# Patient Record
Sex: Female | Born: 1960 | Race: White | Hispanic: No | Smoking: Former smoker
Health system: Southern US, Community
[De-identification: ages and names within clinical notes are randomized; demographics above are authoritative.]

## PROBLEM LIST (undated history)

## (undated) DIAGNOSIS — S32040A Wedge compression fracture of fourth lumbar vertebra, initial encounter for closed fracture: Secondary | ICD-10-CM

## (undated) DIAGNOSIS — C801 Malignant (primary) neoplasm, unspecified: Secondary | ICD-10-CM

## (undated) DIAGNOSIS — Z460 Encounter for fitting and adjustment of spectacles and contact lenses: Secondary | ICD-10-CM

## (undated) DIAGNOSIS — Z972 Presence of dental prosthetic device (complete) (partial): Secondary | ICD-10-CM

## (undated) DIAGNOSIS — Z923 Personal history of irradiation: Secondary | ICD-10-CM

## (undated) DIAGNOSIS — M1990 Unspecified osteoarthritis, unspecified site: Secondary | ICD-10-CM

## (undated) DIAGNOSIS — M109 Gout, unspecified: Secondary | ICD-10-CM

## (undated) DIAGNOSIS — M349 Systemic sclerosis, unspecified: Secondary | ICD-10-CM

## (undated) DIAGNOSIS — C50919 Malignant neoplasm of unspecified site of unspecified female breast: Secondary | ICD-10-CM

## (undated) DIAGNOSIS — I7300 Raynaud's syndrome without gangrene: Secondary | ICD-10-CM

## (undated) DIAGNOSIS — M810 Age-related osteoporosis without current pathological fracture: Secondary | ICD-10-CM

## (undated) DIAGNOSIS — M11279 Other chondrocalcinosis, unspecified ankle and foot: Secondary | ICD-10-CM

## (undated) HISTORY — DX: Personal history of irradiation: Z92.3

## (undated) HISTORY — DX: Systemic sclerosis, unspecified: M34.9

## (undated) HISTORY — PX: TONSILLECTOMY: SUR1361

## (undated) HISTORY — PX: WRIST GANGLION EXCISION: SUR520

## (undated) HISTORY — PX: KNEE ARTHROPLASTY: SHX992

## (undated) HISTORY — PX: BREAST SURGERY: SHX581

## (undated) HISTORY — PX: ELBOW SURGERY: SHX618

---

## 2004-05-04 HISTORY — PX: KNEE ARTHROSCOPY: SUR90

## 2010-05-04 HISTORY — PX: CERVICAL CONIZATION W/BX: SHX1330

## 2012-03-01 ENCOUNTER — Other Ambulatory Visit: Payer: Self-pay | Admitting: Orthopedic Surgery

## 2012-03-04 ENCOUNTER — Encounter (HOSPITAL_BASED_OUTPATIENT_CLINIC_OR_DEPARTMENT_OTHER): Payer: Self-pay | Admitting: *Deleted

## 2012-03-04 NOTE — Progress Notes (Signed)
No cardiac or resp problems 

## 2012-03-07 NOTE — H&P (Addendum)
  Doris Bender is an 51 y.o. female.   Chief Complaint: c/o chronic pain right lateral elbow and right wrist dorsal ganglion cyst. HPI: She has a history of pain in the lateral aspect of her right elbow dating back more than 5 months. She can't lift an ice tea pitcher and can't do much lifting in a palm down position without severe pain. She has really not had any specific treatment for this other than resting her elbow. A second problem is a small but very painful sub-retinacular dorsal ganglion cyst of the right wrist.    Past Medical History  Diagnosis Date  . No pertinent past medical history   . Raynaud's disease   . Arthritis   . Contact lens/glasses fitting     wears contacts or glasses  . Wears partial dentures     partial lower    Past Surgical History  Procedure Date  . Wrist ganglion excision     left x2  . Tonsillectomy   . Breast surgery     rt br bx  . Knee arthroscopy 2006    right  . Cervical conization w/bx     No family history on file. Social History:  reports that she has been smoking.  She does not have any smokeless tobacco history on file. She reports that she drinks alcohol. Her drug history not on file.  Allergies:  Allergies  Allergen Reactions  . Ibuprofen Hives  . Keflex (Cephalexin) Hives  . Ketorolac Hives  . Lodine (Etodolac) Hives    No prescriptions prior to admission    No results found for this or any previous visit (from the past 48 hour(s)).  No results found.   Pertinent items are noted in HPI.  Height 5' 5.5" (1.664 m), weight 58.968 kg (130 lb).  General appearance: alert Head: Normocephalic, without obvious abnormality Neck: supple, symmetrical, trachea midline Resp: clear to auscultation bilaterally Cardio: regular rate and rhythm GI: normal findings: bowel sounds normal Extremities:Physical exam reveals a thin well appearing 51 year old woman. Inspection of her arm reveals no visible deformity. In wrist  palmar flexion she is limited at 45 degrees with pain. There is a 1 cm diameter sub-retinacular ganglion palpable directly over the scapholunate ligament. This causes pain with attempted dorsiflexion and load bearing and/or palmar flexion. She has full ROM of her fingers in flexion/extension. Her pulses and capillary refill are intact.  Her elbow exam reveals extension to neutral, further flexion to 140. She is very tender on palpation over her extensor carpi radialis brevis origin and has a positive middle finger extension test. She cannot tolerate any resisted wrist extension.   Plain films of her wrist demonstrate normal bony anatomy.   X-rays of her elbow demonstrate normal anatomy with small amount of new bone formation at the lateral epicondyle evidencing chronic lateral epicondylitis.   Pulses: 2+ and symmetric Skin: normal Neurologic: Grossly normal    Assessment/Plan Impression:Right elbow chronic lateral epicondylitis and right wrist dorsal ganglion cyst  Plan:To the OR for reconstruction extensor origin and excision dorsal ganglion cyst.The procedure, risks,benefits and post-op course were discussed with the patient at length and they were in agreement with the plan.   DASNOIT,Loxley Cibrian J 03/07/2012, 9:28 PM     H&P documentation: 03/08/2012  -History and Physical Reviewed  -Patient has been re-examined  -No change in the plan of care  Wyn Forster, MD

## 2012-03-08 ENCOUNTER — Ambulatory Visit (HOSPITAL_BASED_OUTPATIENT_CLINIC_OR_DEPARTMENT_OTHER): Payer: BC Managed Care – PPO | Admitting: Anesthesiology

## 2012-03-08 ENCOUNTER — Ambulatory Visit (HOSPITAL_BASED_OUTPATIENT_CLINIC_OR_DEPARTMENT_OTHER)
Admission: RE | Admit: 2012-03-08 | Discharge: 2012-03-08 | Disposition: A | Discharge status: Home / Self Care | Payer: BC Managed Care – PPO | Source: Ambulatory Visit | Attending: Orthopedic Surgery | Admitting: Orthopedic Surgery

## 2012-03-08 ENCOUNTER — Encounter (HOSPITAL_BASED_OUTPATIENT_CLINIC_OR_DEPARTMENT_OTHER): Payer: Self-pay | Admitting: Anesthesiology

## 2012-03-08 ENCOUNTER — Encounter (HOSPITAL_BASED_OUTPATIENT_CLINIC_OR_DEPARTMENT_OTHER): Payer: Self-pay

## 2012-03-08 ENCOUNTER — Encounter (HOSPITAL_BASED_OUTPATIENT_CLINIC_OR_DEPARTMENT_OTHER)
Admission: RE | Disposition: A | Discharge status: Home / Self Care | Payer: Self-pay | Source: Ambulatory Visit | Attending: Orthopedic Surgery

## 2012-03-08 DIAGNOSIS — M6740 Ganglion, unspecified site: Secondary | ICD-10-CM | POA: Insufficient documentation

## 2012-03-08 DIAGNOSIS — M6580 Other synovitis and tenosynovitis, unspecified site: Secondary | ICD-10-CM | POA: Insufficient documentation

## 2012-03-08 DIAGNOSIS — M7710 Lateral epicondylitis, unspecified elbow: Secondary | ICD-10-CM | POA: Insufficient documentation

## 2012-03-08 HISTORY — DX: Encounter for fitting and adjustment of spectacles and contact lenses: Z46.0

## 2012-03-08 HISTORY — DX: Unspecified osteoarthritis, unspecified site: M19.90

## 2012-03-08 HISTORY — PX: LATERAL EPICONDYLE RELEASE: SHX1958

## 2012-03-08 HISTORY — DX: Raynaud's syndrome without gangrene: I73.00

## 2012-03-08 HISTORY — DX: Presence of dental prosthetic device (complete) (partial): Z97.2

## 2012-03-08 HISTORY — PX: MASS EXCISION: SHX2000

## 2012-03-08 LAB — POCT HEMOGLOBIN-HEMACUE: Hemoglobin: 15.2 g/dL — ABNORMAL HIGH (ref 12.0–15.0)

## 2012-03-08 SURGERY — TENNIS ELBOW RELEASE/NIRSCHEL PROCEDURE
Anesthesia: General | Site: Wrist | Laterality: Right | Wound class: Clean

## 2012-03-08 MED ORDER — LACTATED RINGERS IV SOLN
INTRAVENOUS | Status: DC
  Administered 2012-03-08: 12:00:00 via INTRAVENOUS
  Administered 2012-03-08: 10 mL/h via INTRAVENOUS
  Administered 2012-03-08: 10:00:00 via INTRAVENOUS

## 2012-03-08 MED ORDER — DOXYCYCLINE HYCLATE 100 MG PO TABS: 100.0000 mg | ORAL_TABLET | Freq: Two times a day (BID) | ORAL | Status: DC

## 2012-03-08 MED ORDER — FENTANYL CITRATE 0.05 MG/ML IJ SOLN
INTRAMUSCULAR | Status: DC | PRN
  Administered 2012-03-08: 100 ug via INTRAVENOUS
  Administered 2012-03-08: 50 ug via INTRAVENOUS

## 2012-03-08 MED ORDER — OXYCODONE HCL 5 MG PO TABS: 5.0000 mg | ORAL_TABLET | Freq: Once | ORAL | Status: DC | PRN

## 2012-03-08 MED ORDER — OXYCODONE HCL 5 MG/5ML PO SOLN: 5.0000 mg | Freq: Once | ORAL | Status: DC | PRN

## 2012-03-08 MED ORDER — HYDROMORPHONE HCL PF 1 MG/ML IJ SOLN
0.2500 mg | INTRAMUSCULAR | Status: DC | PRN
  Administered 2012-03-08 (×4): 0.5 mg via INTRAVENOUS

## 2012-03-08 MED ORDER — LIDOCAINE HCL (CARDIAC) 20 MG/ML IV SOLN
INTRAVENOUS | Status: DC | PRN
  Administered 2012-03-08: 50 mg via INTRAVENOUS

## 2012-03-08 MED ORDER — PROPOFOL 10 MG/ML IV BOLUS
INTRAVENOUS | Status: DC | PRN
Start: 2012-03-08 — End: 2012-03-08
  Administered 2012-03-08: 150 mg via INTRAVENOUS

## 2012-03-08 MED ORDER — PROMETHAZINE HCL 25 MG/ML IJ SOLN
6.2500 mg | Freq: Once | INTRAMUSCULAR | Status: AC | PRN
  Administered 2012-03-08: 6.25 mg via INTRAVENOUS

## 2012-03-08 MED ORDER — OXYCODONE-ACETAMINOPHEN 5-325 MG PO TABS: ORAL_TABLET | ORAL | Status: DC

## 2012-03-08 MED ORDER — VANCOMYCIN HCL IN DEXTROSE 1-5 GM/200ML-% IV SOLN
1000.0000 mg | INTRAVENOUS | Status: AC
  Administered 2012-03-08: 1000 mg via INTRAVENOUS

## 2012-03-08 MED ORDER — LIDOCAINE HCL 2 % IJ SOLN
INTRAMUSCULAR | Status: DC | PRN
  Administered 2012-03-08: 4 mL

## 2012-03-08 MED ORDER — DEXAMETHASONE SODIUM PHOSPHATE 4 MG/ML IJ SOLN
INTRAMUSCULAR | Status: DC | PRN
  Administered 2012-03-08: 5 mg via INTRAVENOUS

## 2012-03-08 MED ORDER — CHLORHEXIDINE GLUCONATE 4 % EX LIQD: 60.0000 mL | Freq: Once | CUTANEOUS | Status: DC

## 2012-03-08 MED ORDER — ONDANSETRON HCL 4 MG/2ML IJ SOLN
INTRAMUSCULAR | Status: DC | PRN
  Administered 2012-03-08: 4 mg via INTRAVENOUS

## 2012-03-08 SURGICAL SUPPLY — 87 items
BANDAGE ADHESIVE 1X3 (GAUZE/BANDAGES/DRESSINGS) IMPLANT
BANDAGE CONFORM 3  STR LF (GAUZE/BANDAGES/DRESSINGS) IMPLANT
BANDAGE ELASTIC 3 VELCRO ST LF (GAUZE/BANDAGES/DRESSINGS) IMPLANT
BANDAGE ELASTIC 4 VELCRO ST LF (GAUZE/BANDAGES/DRESSINGS) ×3 IMPLANT
BLADE MINI RND TIP GREEN BEAV (BLADE) ×3 IMPLANT
BLADE SURG 10 STRL SS (BLADE) IMPLANT
BLADE SURG 15 STRL LF DISP TIS (BLADE) ×2 IMPLANT
BLADE SURG 15 STRL SS (BLADE) ×1
BNDG COHESIVE 1X5 TAN STRL LF (GAUZE/BANDAGES/DRESSINGS) ×3 IMPLANT
BNDG ELASTIC 2 VLCR STRL LF (GAUZE/BANDAGES/DRESSINGS) IMPLANT
BNDG ESMARK 4X9 LF (GAUZE/BANDAGES/DRESSINGS) ×3 IMPLANT
BRUSH SCRUB EZ PLAIN DRY (MISCELLANEOUS) ×3 IMPLANT
CLOTH BEACON ORANGE TIMEOUT ST (SAFETY) ×3 IMPLANT
CORDS BIPOLAR (ELECTRODE) ×3 IMPLANT
COVER MAYO STAND STRL (DRAPES) ×3 IMPLANT
COVER TABLE BACK 60X90 (DRAPES) ×3 IMPLANT
CUFF TOURNIQUET SINGLE 18IN (TOURNIQUET CUFF) ×3 IMPLANT
DECANTER SPIKE VIAL GLASS SM (MISCELLANEOUS) IMPLANT
DRAIN PENROSE 1/2X12 LTX STRL (WOUND CARE) IMPLANT
DRAIN PENROSE 1/4X12 LTX STRL (WOUND CARE) IMPLANT
DRAPE EXTREMITY T 121X128X90 (DRAPE) ×3 IMPLANT
DRAPE SURG 17X23 STRL (DRAPES) ×3 IMPLANT
DRSG PAD ABDOMINAL 8X10 ST (GAUZE/BANDAGES/DRESSINGS) IMPLANT
DRSG TEGADERM 2-3/8X2-3/4 SM (GAUZE/BANDAGES/DRESSINGS) IMPLANT
DRSG TEGADERM 4X4.75 (GAUZE/BANDAGES/DRESSINGS) ×3 IMPLANT
GAUZE XEROFORM 1X8 LF (GAUZE/BANDAGES/DRESSINGS) IMPLANT
GLOVE BIO SURGEON STRL SZ 6.5 (GLOVE) ×3 IMPLANT
GLOVE BIOGEL M STRL SZ7.5 (GLOVE) ×3 IMPLANT
GLOVE BIOGEL PI IND STRL 8 (GLOVE) IMPLANT
GLOVE BIOGEL PI INDICATOR 8 (GLOVE)
GLOVE EXAM NITRILE EXT CUFF MD (GLOVE) ×3 IMPLANT
GLOVE ORTHO TXT STRL SZ7.5 (GLOVE) ×3 IMPLANT
GOWN PREVENTION PLUS XLARGE (GOWN DISPOSABLE) ×3 IMPLANT
GOWN STRL REIN XL XLG (GOWN DISPOSABLE) ×6 IMPLANT
KWIRE 4.0 X .035IN (WIRE) ×3 IMPLANT
KWIRE 4.0 X .045IN (WIRE) IMPLANT
LOOP VESSEL MAXI BLUE (MISCELLANEOUS) IMPLANT
NDL SAFETY ECLIPSE 18X1.5 (NEEDLE) ×2 IMPLANT
NDL SUT 6 .5 CRC .975X.05 MAYO (NEEDLE) IMPLANT
NEEDLE 27GAX1X1/2 (NEEDLE) ×3 IMPLANT
NEEDLE BLUNT 17GA (NEEDLE) ×3 IMPLANT
NEEDLE FISTULA 1/2 CIRCLE (NEEDLE) IMPLANT
NEEDLE HYPO 18GX1.5 SHARP (NEEDLE) ×1
NEEDLE KEITH (NEEDLE) IMPLANT
NEEDLE KEITH SZ10 STRAIGHT (NEEDLE) IMPLANT
NEEDLE MAYO TAPER (NEEDLE)
PACK BASIN DAY SURGERY FS (CUSTOM PROCEDURE TRAY) ×3 IMPLANT
PAD CAST 3X4 CTTN HI CHSV (CAST SUPPLIES) ×2 IMPLANT
PAD CAST 4YDX4 CTTN HI CHSV (CAST SUPPLIES) IMPLANT
PADDING CAST ABS 4INX4YD NS (CAST SUPPLIES)
PADDING CAST ABS COTTON 4X4 ST (CAST SUPPLIES) IMPLANT
PADDING CAST COTTON 3X4 STRL (CAST SUPPLIES) ×1
PADDING CAST COTTON 4X4 STRL (CAST SUPPLIES)
PADDING UNDERCAST 2  STERILE (CAST SUPPLIES) IMPLANT
SLEEVE SCD COMPRESS KNEE MED (MISCELLANEOUS) ×3 IMPLANT
SLING ARM FOAM STRAP LRG (SOFTGOODS) IMPLANT
SPLINT PLASTER CAST XFAST 3X15 (CAST SUPPLIES) ×10 IMPLANT
SPLINT PLASTER XTRA FASTSET 3X (CAST SUPPLIES) ×5
SPONGE GAUZE 4X4 12PLY (GAUZE/BANDAGES/DRESSINGS) ×3 IMPLANT
STOCKINETTE 4X48 STRL (DRAPES) ×3 IMPLANT
STRIP CLOSURE SKIN 1/2X4 (GAUZE/BANDAGES/DRESSINGS) ×3 IMPLANT
SUT ETHIBOND 2 OS 4 DA (SUTURE) IMPLANT
SUT ETHILON 5 0 P 3 18 (SUTURE) ×1
SUT FIBERWIRE 2-0 18 17.9 3/8 (SUTURE)
SUT FIBERWIRE 3-0 18 TAPR NDL (SUTURE) ×6
SUT MERSILENE 4 0 P 3 (SUTURE) IMPLANT
SUT NYLON ETHILON 5-0 P-3 1X18 (SUTURE) ×2 IMPLANT
SUT PROLENE 3 0 PS 2 (SUTURE) ×3 IMPLANT
SUT VIC AB 2-0 SH 27 (SUTURE)
SUT VIC AB 2-0 SH 27XBRD (SUTURE) IMPLANT
SUT VIC AB 3-0 SH 27 (SUTURE) ×1
SUT VIC AB 3-0 SH 27X BRD (SUTURE) ×2 IMPLANT
SUT VIC AB 3-0 X1 27 (SUTURE) IMPLANT
SUT VIC AB 4-0 P-3 18XBRD (SUTURE) IMPLANT
SUT VIC AB 4-0 P3 18 (SUTURE)
SUT VICRYL 4-0 PS2 18IN ABS (SUTURE) IMPLANT
SUTURE FIBERWR 2-0 18 17.9 3/8 (SUTURE) IMPLANT
SUTURE FIBERWR 3-0 18 TAPR NDL (SUTURE) ×4 IMPLANT
SYR 20CC LL (SYRINGE) IMPLANT
SYR 3ML 23GX1 SAFETY (SYRINGE) IMPLANT
SYR BULB 3OZ (MISCELLANEOUS) ×3 IMPLANT
SYR CONTROL 10ML LL (SYRINGE) ×3 IMPLANT
TOWEL OR 17X24 6PK STRL BLUE (TOWEL DISPOSABLE) ×6 IMPLANT
TRAY DSU PREP LF (CUSTOM PROCEDURE TRAY) ×3 IMPLANT
TUBE CONNECTING 20X1/4 (TUBING) IMPLANT
UNDERPAD 30X30 INCONTINENT (UNDERPADS AND DIAPERS) ×3 IMPLANT
WATER STERILE IRR 1000ML POUR (IV SOLUTION) IMPLANT

## 2012-03-08 NOTE — Brief Op Note (Signed)
03/08/2012  12:02 PM  PATIENT:  Doris Bender  51 y.o. female  PRE-OPERATIVE DIAGNOSIS:  LATERAL ELBOW PAIN, RIGHT WRIST CYST [phone]  POST-OPERATIVE DIAGNOSIS:  LATERAL ELBOW PAIN, RIGHT WRIST CYST [phone]  PROCEDURE:  Procedure(s) (LRB) with comments: TENNIS ELBOW RELEASE (Right) - RECONSTRUCTION OF RIGHT LATERAL ELBOW WITH TENDONS EXCISION MASS (Right) - EXCISION OF RIGHT WRIST DORSAL CYST  SURGEON:  Surgeon(s) and Role:    * Wyn Forster., MD - Primary  PHYSICIAN ASSISTANT:   ASSISTANTS:Kabrina Christiano Dasnoit,P.A-C     ANESTHESIA:   general  EBL:  Total I/O In: 1300 [I.V.:1300] Out: -   BLOOD ADMINISTERED:none  DRAINS: none   LOCAL MEDICATIONS USED:  XYLOCAINE   SPECIMEN:  No Specimen  DISPOSITION OF SPECIMEN:  N/A  COUNTS:  YES  TOURNIQUET:   Total Tourniquet Time Documented: Upper Arm (Right) - 48 minutes  DICTATION: op note dictated and signed  PLAN OF CARE: Discharge to home after PACU  PATIENT DISPOSITION:  PACU - hemodynamically stable.

## 2012-03-08 NOTE — Anesthesia Preprocedure Evaluation (Signed)
Anesthesia Evaluation  Patient identified by MRN, date of birth, ID band Patient awake    Reviewed: Allergy & Precautions, H&P , NPO status , Patient's Chart, lab work & pertinent test results  Airway Mallampati: III TM Distance: >3 FB Neck ROM: Full    Dental No notable dental hx. (+) Teeth Intact, Partial Lower and Dental Advisory Given   Pulmonary Current Smoker,  breath sounds clear to auscultation  Pulmonary exam normal       Cardiovascular negative cardio ROS  Rhythm:Regular Rate:Normal     Neuro/Psych negative neurological ROS  negative psych ROS   GI/Hepatic negative GI ROS, Neg liver ROS,   Endo/Other  negative endocrine ROS  Renal/GU negative Renal ROS  negative genitourinary   Musculoskeletal   Abdominal   Peds  Hematology negative hematology ROS (+)   Anesthesia Other Findings   Reproductive/Obstetrics negative OB ROS                           Anesthesia Physical Anesthesia Plan  ASA: II  Anesthesia Plan: General   Post-op Pain Management:    Induction: Intravenous  Airway Management Planned: LMA  Additional Equipment:   Intra-op Plan:   Post-operative Plan: Extubation in OR  Informed Consent: I have reviewed the patients History and Physical, chart, labs and discussed the procedure including the risks, benefits and alternatives for the proposed anesthesia with the patient or authorized representative who has indicated his/her understanding and acceptance.   Dental advisory given  Plan Discussed with: CRNA  Anesthesia Plan Comments:         Anesthesia Quick Evaluation

## 2012-03-08 NOTE — Anesthesia Postprocedure Evaluation (Signed)
  Anesthesia Post-op Note  Patient: Doris Bender  Procedure(s) Performed: Procedure(s) (LRB) with comments: TENNIS ELBOW RELEASE (Right) - RECONSTRUCTION OF RIGHT LATERAL ELBOW WITH TENDONS EXCISION MASS (Right) - EXCISION OF RIGHT WRIST DORSAL CYST  Patient Location: PACU  Anesthesia Type:General  Level of Consciousness: awake and alert   Airway and Oxygen Therapy: Patient Spontanous Breathing  Post-op Pain: mild  Post-op Assessment: Post-op Vital signs reviewed, Patient's Cardiovascular Status Stable, Respiratory Function Stable, Patent Airway and No signs of Nausea or vomiting  Post-op Vital Signs: Reviewed and stable  Complications: No apparent anesthesia complications

## 2012-03-08 NOTE — Anesthesia Procedure Notes (Signed)
Procedure Name: LMA Insertion Date/Time: 03/08/2012 10:58 AM Performed by: Caren Macadam Pre-anesthesia Checklist: Patient identified, Emergency Drugs available, Suction available and Patient being monitored Patient Re-evaluated:Patient Re-evaluated prior to inductionOxygen Delivery Method: Circle System Utilized Preoxygenation: Pre-oxygenation with 100% oxygen Intubation Type: IV induction Ventilation: Mask ventilation without difficulty LMA: LMA inserted LMA Size: 4.0 Number of attempts: 1 Airway Equipment and Method: bite block Placement Confirmation: positive ETCO2 and breath sounds checked- equal and bilateral Tube secured with: Tape Dental Injury: Teeth and Oropharynx as per pre-operative assessment

## 2012-03-08 NOTE — Transfer of Care (Signed)
Immediate Anesthesia Transfer of Care Note  Patient: Charline Rice Pol  Procedure(s) Performed: Procedure(s) (LRB) with comments: TENNIS ELBOW RELEASE (Right) - RECONSTRUCTION OF RIGHT LATERAL ELBOW WITH TENDONS EXCISION MASS (Right) - EXCISION OF RIGHT WRIST DORSAL CYST  Patient Location: PACU  Anesthesia Type:General  Level of Consciousness: awake and alert   Airway & Oxygen Therapy: Patient Spontanous Breathing and Patient connected to face mask oxygen  Post-op Assessment: Report given to PACU RN and Post -op Vital signs reviewed and stable  Post vital signs: Reviewed and stable  Complications: No apparent anesthesia complications

## 2012-03-09 ENCOUNTER — Encounter (HOSPITAL_BASED_OUTPATIENT_CLINIC_OR_DEPARTMENT_OTHER): Payer: Self-pay | Admitting: Orthopedic Surgery

## 2012-03-09 NOTE — Op Note (Signed)
NAMESHARRELL, KRAWIEC             ACCOUNT NO.:  [id number]  MEDICAL RECORD NO.:  [id number]  LOCATION:                                 FACILITY:  PHYSICIAN:  Katy Fitch. Seila Liston, M.D. DATE OF BIRTH:  1961/01/23  DATE OF PROCEDURE:  03/08/2012 DATE OF DISCHARGE:                              OPERATIVE REPORT   PREOPERATIVE DIAGNOSES: 1. Cavitary tendinopathy, right elbow common extensor origin. 2. Uncomfortable cyst, dorsal aspect of right wrist.  POSTOPERATIVE DIAGNOSES: 1. Cavitary tendinopathy, extensor carpi radialis brevis, right elbow. 2. Vascular myxoid cyst, overlying extensor retinaculum, right dorsal     wrist.  OPERATION: 1. Reconstruction of right elbow common extensor origin with     debridement of degenerative tendinopathy followed by reattachment     of extensor carpi radialis longus and brevis to decorticated and     drilled right lateral condyle with through bone sutures. 2. Resection of dorsal myxoid cyst, right wrist.  OPERATING SURGEON:  Katy Fitch. Vinnie Gombert, M.D.  ASSISTANT:  Marveen Reeks Dasnoit, PA-C  ANESTHESIA:  General by LMA.  SUPERVISING ANESTHESIOLOGIST:  Zenon Mayo, MD  INDICATIONS:  Doris Bender is a 51 year old woman referred for evaluation and management of a painful right elbow and a mass on the dorsal aspect of her right wrist.  Clinical examination revealed signs of chronic epicondylitis and a probable myxoid cyst on the dorsal aspect of the right wrist.  Plain x-rays of the elbow were nondiagnostic except for some reactive bone formation.  Plain x-rays of the wrist were nondiagnostic.  Due to chronic pain and impairment of grip, we sent Doris Bender for an MRI of the elbow.  This demonstrated a moderate degree of biceps tendinopathy at the insertion without a complete rupture and a significant cavitary tendinopathy of the common extensor origin.  After informed consent, we advised her to proceed with debridement of her  lateral extensor origin with drilling and decortication of the lateral epicondyle followed by reconstruction of the common sensory origin with through bone sutures.  We also recommended resection of her cyst at the wrist.  Preoperatively, she was reminded the potential risks and benefits of surgery.  She understands that she will have a splint on her wrist for several weeks following surgery.  She will need to carefully mobilize her elbow immediately in the postoperative period and will be splinted for 1 week at the wrist level.  She understands that she will not be able to do heavy lifting with her arm for 12 weeks postop.  The full aftercare protocol was described to her in the office.  Questions were invited and answered in detail.  PROCEDURE:  Doris Bender was brought to room #2 of the [facility] and placed supine position on the operating table.  Following detailed anesthesia and informed consent by Dr. Sampson Goon, general anesthesia by LMA technique was recommended and accepted.  In room #2 under Dr. Jarrett Ables direct supervision, general anesthesia by LMA technique was induced followed by routine Betadine scrub and paint of the right upper extremity.  Sterile stockinette and impervious arthroscopy drapes were applied followed by exsanguination of the right arm with an Esmarch bandage, inflation of the arterial  tourniquet on the proximal brachium to 220 mmHg.  The procedure commenced with routine surgical time-out.  We confirmed that 1 g of vancomycin had been initiated in the holding area as an IV prophylactic antibiotic.  The procedure commenced with a transverse incision directly overlying the mass at the wrist.  Subcutaneous tissues were carefully divided revealing a myxoid cyst growing in the wall of the large caliber dorsal vein.  This was adherent to the extensor retinaculum, but did not appear to penetrate the retinaculum.  This was  circumferentially dissected and excised with a small segment of normal-appearing vein on each side.  The dorsal wrist capsule was then carefully explored with examination of the radiocarpal and midcarpal articulations.  We could not identify deeper origin of the cyst.  The wrist wound was then repaired with intradermal 3-0 Prolene and Steri- Strip.  A 2% lidocaine was infiltrated for postoperative analgesia.  Attention was then directed to the lateral elbow.  A curvilinear incision was fashioned directly over the anconeus extensor carpi radialis longus interface.  Incision was taken sharply followed by release of the superficial fascia.  The common extensor origin was elevated directly off the epicondyle revealing 1 centimeter square area of cavitary tendinopathy. This was debrided of necrotic tendon followed by drilling the lateral epicondyle approximately 30 times with a 0.035-inch Kirschner wire. After decortication and preparation of the condylar surface, we passed two mattress sutures through drill holes securing the common extensor origin of extensor carpi radialis longus and brevis to an anatomic footprint.  The tails of the sutures were then tied as mattress sutures over the top, inserting the edge of the tendon repair.  The fascia was then repaired as a separate layer with a running suture of 3-0 Vicryl.  The skin was repaired with subcutaneous 3-0 Vicryl and intradermal 3-0 Prolene with Steri-Strip.  The wound was infiltrated with 2% lidocaine for postoperative comfort. Doris Bender was placed in compressive dressing at the elbow with sterile gauze and Tegaderm followed by Ace wrap, and the wrist was immobilized with a well-padded volar splint maintaining the wrist in 25 degrees of dorsiflexion.  There were no apparent complications.     Katy Fitch Chisom Aust, M.D.     RVS/MEDQ  D:  03/08/2012  T:  03/09/2012  Job:  [id number]  cc:   [name], MD

## 2012-10-18 DIAGNOSIS — C50412 Malignant neoplasm of upper-outer quadrant of left female breast: Secondary | ICD-10-CM | POA: Insufficient documentation

## 2012-11-04 ENCOUNTER — Emergency Department (HOSPITAL_COMMUNITY): Payer: BC Managed Care – PPO

## 2012-11-04 ENCOUNTER — Encounter (HOSPITAL_COMMUNITY): Payer: Self-pay | Admitting: *Deleted

## 2012-11-04 ENCOUNTER — Emergency Department (HOSPITAL_COMMUNITY)
Admission: EM | Admit: 2012-11-04 | Discharge: 2012-11-04 | Disposition: A | Discharge status: Home / Self Care | Payer: BC Managed Care – PPO | Attending: Emergency Medicine | Admitting: Emergency Medicine

## 2012-11-04 DIAGNOSIS — X500XXA Overexertion from strenuous movement or load, initial encounter: Secondary | ICD-10-CM | POA: Insufficient documentation

## 2012-11-04 DIAGNOSIS — S32009A Unspecified fracture of unspecified lumbar vertebra, initial encounter for closed fracture: Secondary | ICD-10-CM | POA: Insufficient documentation

## 2012-11-04 DIAGNOSIS — Z98811 Dental restoration status: Secondary | ICD-10-CM | POA: Insufficient documentation

## 2012-11-04 DIAGNOSIS — F17200 Nicotine dependence, unspecified, uncomplicated: Secondary | ICD-10-CM | POA: Insufficient documentation

## 2012-11-04 DIAGNOSIS — Z8679 Personal history of other diseases of the circulatory system: Secondary | ICD-10-CM | POA: Insufficient documentation

## 2012-11-04 DIAGNOSIS — Z853 Personal history of malignant neoplasm of breast: Secondary | ICD-10-CM | POA: Insufficient documentation

## 2012-11-04 DIAGNOSIS — Y93F9 Activity, other caregiving: Secondary | ICD-10-CM | POA: Insufficient documentation

## 2012-11-04 DIAGNOSIS — Y929 Unspecified place or not applicable: Secondary | ICD-10-CM | POA: Insufficient documentation

## 2012-11-04 DIAGNOSIS — S32040A Wedge compression fracture of fourth lumbar vertebra, initial encounter for closed fracture: Secondary | ICD-10-CM

## 2012-11-04 DIAGNOSIS — S32000A Wedge compression fracture of unspecified lumbar vertebra, initial encounter for closed fracture: Secondary | ICD-10-CM

## 2012-11-04 DIAGNOSIS — Z8739 Personal history of other diseases of the musculoskeletal system and connective tissue: Secondary | ICD-10-CM | POA: Insufficient documentation

## 2012-11-04 HISTORY — DX: Malignant (primary) neoplasm, unspecified: C80.1

## 2012-11-04 HISTORY — DX: Wedge compression fracture of fourth lumbar vertebra, initial encounter for closed fracture: S32.040A

## 2012-11-04 MED ORDER — FENTANYL CITRATE 0.05 MG/ML IJ SOLN
50.0000 ug | Freq: Once | INTRAMUSCULAR | Status: AC
  Administered 2012-11-04: 50 ug via INTRAMUSCULAR
  Filled 2012-11-04: qty 2

## 2012-11-04 MED ORDER — HYDROCODONE-ACETAMINOPHEN 5-325 MG PO TABS: 2.0000 | ORAL_TABLET | ORAL | Status: DC | PRN

## 2012-11-04 MED ORDER — ONDANSETRON HCL 4 MG PO TABS
4.0000 mg | ORAL_TABLET | Freq: Once | ORAL | Status: AC
  Administered 2012-11-04: 4 mg via ORAL
  Filled 2012-11-04: qty 1

## 2012-11-04 MED ORDER — DIAZEPAM 5 MG PO TABS
5.0000 mg | ORAL_TABLET | Freq: Once | ORAL | Status: AC
  Administered 2012-11-04: 5 mg via ORAL
  Filled 2012-11-04: qty 1

## 2012-11-04 MED ORDER — METHOCARBAMOL 500 MG PO TABS: 500.0000 mg | ORAL_TABLET | Freq: Three times a day (TID) | ORAL | Status: DC

## 2012-11-04 MED ORDER — HYDROCODONE-ACETAMINOPHEN 5-325 MG PO TABS: ORAL_TABLET | ORAL | Status: DC

## 2012-11-04 NOTE — ED Notes (Signed)
Assisted to BR  , ambulated , but had a lot of pain.

## 2012-11-04 NOTE — ED Provider Notes (Signed)
History    CSN: [id number] Arrival date & time 11/04/12  1932  First MD Initiated Contact with Patient 11/04/12 2006     Chief Complaint  Patient presents with  . Back Pain   (Consider location/radiation/quality/duration/timing/severity/associated sxs/prior Treatment) HPI Comments: Patient is a 52 year old female who has a history of ganglion cyst involving the wrist, arthritis, Raynaud's disease, and history of breast cancer who presents to the emergency department with complaint of back pain. Patient states that she was pulling her on up some stairs in a wheelchair when she noted a popping sensation in her back and fell to the ground. Patient complains of pain in the lower back area since that time. His been no loss of bowel or bladder function. Patient is able to ambulate but can comfortably. She has tried rest but no other therapies for this problem.  The history is provided by the patient.   Past Medical History  Diagnosis Date  . No pertinent past medical history   . Raynaud's disease   . Arthritis   . Contact lens/glasses fitting     wears contacts or glasses  . Wears partial dentures     partial lower  . Cancer     breast   Past Surgical History  Procedure Laterality Date  . Wrist ganglion excision      left x2  . Tonsillectomy    . Knee arthroscopy  2006    right  . Cervical conization w/bx    . Lateral epicondyle release  03/08/2012    Procedure: TENNIS ELBOW RELEASE;  Surgeon: Wyn Forster., MD;  Location: [facility];  Service: Orthopedics;  Laterality: Right;  RECONSTRUCTION OF RIGHT LATERAL ELBOW WITH TENDONS  . Mass excision  03/08/2012    Procedure: EXCISION MASS;  Surgeon: Wyn Forster., MD;  Location: [facility];  Service: Orthopedics;  Laterality: Right;  EXCISION OF RIGHT WRIST DORSAL CYST  . Breast surgery      rt br bx   History reviewed. No pertinent family history. History  Substance Use Topics  . Smoking  status: Current Every Day Smoker -- 1.00 packs/day  . Smokeless tobacco: Not on file  . Alcohol Use: Yes     Comment: rare   OB History   Grav Para Term Preterm Abortions TAB SAB Ect Mult Living                 Review of Systems  Constitutional: Negative for activity change.       All ROS Neg except as noted in HPI  HENT: Negative for nosebleeds and neck pain.   Eyes: Negative for photophobia and discharge.  Respiratory: Negative for cough, shortness of breath and wheezing.   Cardiovascular: Negative for chest pain and palpitations.  Gastrointestinal: Negative for abdominal pain and blood in stool.  Genitourinary: Negative for dysuria, frequency and hematuria.  Musculoskeletal: Positive for back pain and arthralgias.  Skin: Negative.   Neurological: Negative for dizziness, seizures and speech difficulty.  Psychiatric/Behavioral: Negative for hallucinations and confusion.    Allergies  Ibuprofen; Keflex; Ketorolac; Lodine; Other; and Oxycodone  Home Medications   Current Outpatient Rx  Name  Route  Sig  Dispense  Refill  . medroxyPROGESTERone (DEPO-PROVERA) 400 MG/ML SUSP   Intramuscular   Inject 400 mg into the muscle every 3 (three) months.           BP 124/71  Pulse 95  Temp(Src) 98.7 F (37.1 C)  Resp  20  Ht 5' 5.5" (1.664 m)  Wt 132 lb (59.875 kg)  BMI 21.62 kg/m2  SpO2 100% Physical Exam  Nursing note and vitals reviewed. Constitutional: She is oriented to person, place, and time. She appears well-developed and well-nourished.  Non-toxic appearance.  HENT:  Head: Normocephalic.  Right Ear: Tympanic membrane and external ear normal.  Left Ear: Tympanic membrane and external ear normal.  Eyes: EOM and lids are normal. Pupils are equal, round, and reactive to light.  Neck: Normal range of motion. Neck supple. Carotid bruit is not present.  Cardiovascular: Normal rate, regular rhythm, normal heart sounds, intact distal pulses and normal pulses.    Pulmonary/Chest: Breath sounds normal. No respiratory distress.  Abdominal: Soft. Bowel sounds are normal. There is no tenderness. There is no guarding.  Musculoskeletal: Normal range of motion.  There is pain to the lumbar area to palpation and with any attempted range of motion. There is paraspinal area spasm and tenderness. No hot areas appreciated.  Lymphadenopathy:       Head (right side): No submandibular adenopathy present.       Head (left side): No submandibular adenopathy present.    She has no cervical adenopathy.  Neurological: She is alert and oriented to person, place, and time. She has normal strength. No cranial nerve deficit or sensory deficit. She exhibits normal muscle tone. Coordination normal.  Gait is very slow and painful. No foot drop appreciated. No motor or sensory deficits appreciated on examination.  Skin: Skin is warm and dry.  Psychiatric: She has a normal mood and affect. Her speech is normal.    ED Course  Procedures (including critical care time) Labs Reviewed - No data to display No results found. No diagnosis found. Pulse oximetry 100% oral air. Within normal limits by my interpretation. MDM  I have reviewed nursing notes, vital signs, and all appropriate lab and imaging results for this patient. The patient was helping to lift her arm up steps while in a wheelchair. The patient felt a" pop in her back". Following this the patient then failed to the ground and it took an extended period of time for her to get up. Moving in any direction was very very painful. There was loss of bowel or bladder function during this event.  X-ray of the lower back reveals a compression fracture of the L4 vertebra. There are mild degenerative changes of the facet joints.  The patient and family have been given the results of the x-rays and examination. The plan at this time is for the patient to use ice to the lower back tonight, and then alternate heat denies for comfort.  Patient is given a prescription for Norco 5 mg, one or 2 tablets every 4 hours as needed for pain. Also given Robaxin 13 times daily for spasm. Patient is referred to Dr. August Saucer for orthopedic evaluation of her vertebral fracture. Patient invited to return to the emergency department if any changes, problems, or concerns.  Kathie Dike, PA-C 11/04/12 2319

## 2012-11-04 NOTE — ED Notes (Signed)
Low back pain, Pt was lifting pt in w/c when felt a "pop"  Alert, , has not taken anything for pain,

## 2012-11-04 NOTE — ED Notes (Addendum)
Pt states she was pulling her aunt up some stairs in a wheel chair and states her back popped and pt fell to the ground. No LOC. Pt is wearing a friends abdominal binder.

## 2012-11-05 NOTE — ED Provider Notes (Signed)
Medical screening examination/treatment/procedure(s) were performed by non-physician practitioner and as supervising physician I was immediately available for consultation/collaboration.  Shavy Beachem R. Hridaan Bouse, MD 11/05/12 2152 

## 2012-11-10 MED FILL — Hydrocodone-Acetaminophen Tab 5-325 MG: ORAL | Qty: 6 | Status: AC

## 2012-11-11 ENCOUNTER — Encounter (INDEPENDENT_AMBULATORY_CARE_PROVIDER_SITE_OTHER): Payer: Self-pay | Admitting: Surgery

## 2012-11-11 ENCOUNTER — Encounter (INDEPENDENT_AMBULATORY_CARE_PROVIDER_SITE_OTHER): Payer: Self-pay

## 2012-11-11 ENCOUNTER — Ambulatory Visit (INDEPENDENT_AMBULATORY_CARE_PROVIDER_SITE_OTHER): Payer: BC Managed Care – PPO | Admitting: Surgery

## 2012-11-11 VITALS — BP 118/62 | HR 62 | Temp 98.0°F | Resp 18 | Ht 65.5 in | Wt 132.0 lb

## 2012-11-11 DIAGNOSIS — C50912 Malignant neoplasm of unspecified site of left female breast: Secondary | ICD-10-CM

## 2012-11-11 DIAGNOSIS — C50919 Malignant neoplasm of unspecified site of unspecified female breast: Secondary | ICD-10-CM

## 2012-11-11 NOTE — Addendum Note (Signed)
Addended by: Ethlyn Gallery on: 11/11/2012 09:53 AM   Modules accepted: Orders

## 2012-11-11 NOTE — Patient Instructions (Signed)
We will arrange an MRI of the breasts and then make a decision about surgery.

## 2012-11-11 NOTE — Progress Notes (Addendum)
Patient ID: Doris Bender, female   DOB: June 26, 1960, 52 y.o.   MRN: [id number]  Chief Complaint  Patient presents with  . Breast Cancer    breast ca    HPI Doris Bender is a 52 y.o. female.  She had her routine mammogram done in June and an abnormality was seen. This was followed up by an ultrasound and then a needle core biopsy. The biopsy shows that she has invasive ductal carcinoma of the left breast. She is not having any breast symptoms. Her only breast problem in the past with removal of a benign breast lump when she was 52 years old. Her maternal and has had breast cancer. There is no other family history of breast cancer. She is referred to [place] for evaluation and management recommendations.  HPI  Past Medical History  Diagnosis Date  . No pertinent past medical history   . Raynaud's disease   . Arthritis   . Contact lens/glasses fitting     wears contacts or glasses  . Wears partial dentures     partial lower  . Cancer     breast    Past Surgical History  Procedure Laterality Date  . Wrist ganglion excision      left x2  . Tonsillectomy    . Knee arthroscopy  2006    right  . Cervical conization w/bx    . Lateral epicondyle release  03/08/2012    Procedure: TENNIS ELBOW RELEASE;  Surgeon: Wyn Forster., MD;  Location: [facility];  Service: Orthopedics;  Laterality: Right;  RECONSTRUCTION OF RIGHT LATERAL ELBOW WITH TENDONS  . Mass excision  03/08/2012    Procedure: EXCISION MASS;  Surgeon: Wyn Forster., MD;  Location: [facility];  Service: Orthopedics;  Laterality: Right;  EXCISION OF RIGHT WRIST DORSAL CYST  . Breast surgery      rt br bx  . Tonsillectomy      52yrs old    Family History  Problem Relation Age of Onset  . Hyperthyroidism Mother   . Cirrhosis Father   . Diabetes Brother     Social History History  Substance Use Topics  . Smoking status: Current Every Day Smoker -- 1.00 packs/day  . Smokeless  tobacco: Not on file  . Alcohol Use: Yes     Comment: rare    Allergies  Allergen Reactions  . Ibuprofen Hives  . Keflex (Cephalexin) Hives  . Ketorolac Hives  . Lodine (Etodolac) Hives  . Methocarbamol Nausea Only  . Other     Ibegrofon: this may be a misspelling of IBUUPROFEN per patient  . Oxycodone     Current Outpatient Prescriptions  Medication Sig Dispense Refill  . ALPRAZolam (XANAX) 0.25 MG tablet Take 0.25 mg by mouth at bedtime as needed for sleep.      . medroxyPROGESTERone (DEPO-PROVERA) 150 MG/ML injection       . medroxyPROGESTERone (DEPO-PROVERA) 400 MG/ML SUSP Inject 400 mg into the muscle every 3 (three) months.       Marland Kitchen HYDROcodone-acetaminophen (NORCO/VICODIN) 5-325 MG per tablet Take 2 tablets by mouth every 4 (four) hours as needed for pain.  6 tablet  0  . HYDROcodone-acetaminophen (NORCO/VICODIN) 5-325 MG per tablet 1 or 2 tablets q4h prn pain  30 tablet  0  . methocarbamol (ROBAXIN) 500 MG tablet Take 1 tablet (500 mg total) by mouth 3 (three) times daily.  21 tablet  0   No current facility-administered medications  for this visit.    Review of Systems Review of Systems  Constitutional: Negative for fever, chills and unexpected weight change.  HENT: Negative for hearing loss, congestion, sore throat, trouble swallowing and voice change.   Eyes: Negative for visual disturbance.  Respiratory: Negative for cough and wheezing.   Cardiovascular: Negative for chest pain, palpitations and leg swelling.  Gastrointestinal: Negative for nausea, vomiting, abdominal pain, diarrhea, constipation, blood in stool, abdominal distention and anal bleeding.  Genitourinary: Negative for hematuria, vaginal bleeding and difficulty urinating.  Musculoskeletal: Positive for back pain. Negative for arthralgias.       Apparently has a compression fracture of L4 which occurred on July 4, she's been treated by an orthopedist and an MRI of her back is planned.  Skin: Negative for  rash and wound.  Neurological: Negative for seizures, syncope and headaches.  Hematological: Negative for adenopathy. Does not bruise/bleed easily.  Psychiatric/Behavioral: Negative for confusion.    Blood pressure 118/62, pulse 62, temperature 98 F (36.7 C), resp. rate 18, height 5' 5.5" (1.664 m), weight 132 lb (59.875 kg).  Physical Exam Physical Exam  Vitals reviewed. Constitutional: She is oriented to person, place, and time. She appears well-developed and well-nourished. No distress.  HENT:  Head: Normocephalic and atraumatic.  Mouth/Throat: Oropharynx is clear and moist.  Eyes: Conjunctivae and EOM are normal. Pupils are equal, round, and reactive to light. No scleral icterus.  Neck: Normal range of motion. Neck supple. No tracheal deviation present. No thyromegaly present.  Cardiovascular: Normal rate, regular rhythm, normal heart sounds and intact distal pulses.  Exam reveals no gallop and no friction rub.   No murmur heard. Pulmonary/Chest: Effort normal and breath sounds normal. No respiratory distress. She has no wheezes. She has no rales. Right breast exhibits no inverted nipple, no mass, no nipple discharge, no skin change and no tenderness. Left breast exhibits no inverted nipple, no mass, no nipple discharge, no skin change and no tenderness. Breasts are symmetrical.  Abdominal: Soft. Bowel sounds are normal. She exhibits no distension and no mass. There is no tenderness. There is no rebound and no guarding.  Musculoskeletal: Normal range of motion. She exhibits no edema and no tenderness.  Lymphadenopathy:    She has no cervical adenopathy.    She has no axillary adenopathy.       Right: No supraclavicular adenopathy present.       Left: No supraclavicular adenopathy present.  Neurological: She is alert and oriented to person, place, and time.  Skin: Skin is warm and dry. No rash noted. She is not diaphoretic. No erythema.  Psychiatric: She has a normal mood and  affect. Her behavior is normal. Judgment and thought content normal.    Data Reviewed I have reviewed the notes from her gynecologist office visit, but there are no notes regarding her breast cancer there.The radiology report shows the mass 4.65mm. It is ER+, PR neg, Her 2 =2+ and Mib12%  Assessment    Clinical stage I left breast cancer, upper outer quadrant     Plan    I have explained the pathophysiology and staging of breast cancer with particular attention to her exact situation. We discussed the multidisciplinary approach to breast cancer which often includes both medical and radiation oncology consultations.  We also discussed surgical options for the treatment of breast cancer including lumpectomy and mastectomy with possible reconstructive surgery. In addition we talked about the evaluation and management of lymph nodes including a description of sentinel lymph node  biopsy and axillary dissections. We reviewed potential complications and risks including bleeding, infection, numbness,  lymphedema, and the potential need for additional surgery.  She understands that for patients who are candidate for lumpectomy or mastectomy there is an equal survival rate with either technique, but a slightly higher local recurrence rate with lumpectomy. In addition she knows that a lumpectomy usually requires postoperative radiation as part of the management of the breast cancer.  We have discussed the likely postoperative course and plans for followup.  I have given the patient some written information that reviewed all of these issues. I believe her questions are answered and that she has a good understanding of the issues.  We will schedule an MRI of her breasts to be sure we do not have additional cancers and/or a bigger tumors and we think from the mammograms. We are going to obtain the ancillary information. Hopefully she can be presented at breast conference. Because of her significant back pain  her diagnostic tests may be delayed a little bit. She appears at this point a good candidate for a wire localized lumpectomy and sentinel lymph node followed by radiation therapy.       Allison Deshotels J 11/11/2012, 9:29 AM

## 2012-11-15 ENCOUNTER — Encounter (INDEPENDENT_AMBULATORY_CARE_PROVIDER_SITE_OTHER): Payer: Self-pay

## 2012-11-15 ENCOUNTER — Other Ambulatory Visit (INDEPENDENT_AMBULATORY_CARE_PROVIDER_SITE_OTHER): Payer: Self-pay | Admitting: Surgery

## 2012-11-15 DIAGNOSIS — C50919 Malignant neoplasm of unspecified site of unspecified female breast: Secondary | ICD-10-CM

## 2012-11-15 HISTORY — DX: Malignant neoplasm of unspecified site of unspecified female breast: C50.919

## 2012-11-21 ENCOUNTER — Ambulatory Visit
Admission: RE | Admit: 2012-11-21 | Discharge: 2012-11-21 | Disposition: A | Discharge status: Home / Self Care | Payer: BC Managed Care – PPO | Source: Ambulatory Visit | Attending: Surgery | Admitting: Surgery

## 2012-11-21 DIAGNOSIS — C50912 Malignant neoplasm of unspecified site of left female breast: Secondary | ICD-10-CM

## 2012-11-21 MED ORDER — GADOBENATE DIMEGLUMINE 529 MG/ML IV SOLN
12.0000 mL | Freq: Once | INTRAVENOUS | Status: AC | PRN
  Administered 2012-11-21: 12 mL via INTRAVENOUS

## 2012-11-24 ENCOUNTER — Telehealth (INDEPENDENT_AMBULATORY_CARE_PROVIDER_SITE_OTHER): Payer: Self-pay

## 2012-11-24 NOTE — Telephone Encounter (Signed)
Spoke with pt - giving pathology results; only the known cancer and all else OK - Dr. Jamey Ripa will place orders when he can (either when he has internet access or back in the office 8/4)

## 2012-11-25 ENCOUNTER — Encounter (INDEPENDENT_AMBULATORY_CARE_PROVIDER_SITE_OTHER): Payer: Self-pay

## 2012-12-05 ENCOUNTER — Telehealth (INDEPENDENT_AMBULATORY_CARE_PROVIDER_SITE_OTHER): Payer: Self-pay

## 2012-12-05 ENCOUNTER — Other Ambulatory Visit (INDEPENDENT_AMBULATORY_CARE_PROVIDER_SITE_OTHER): Payer: Self-pay | Admitting: Surgery

## 2012-12-05 DIAGNOSIS — C50912 Malignant neoplasm of unspecified site of left female breast: Secondary | ICD-10-CM

## 2012-12-05 NOTE — Telephone Encounter (Signed)
Patient calling into office to check the status of her surgery being scheduled.  Patient advised that Dr. Jamey Ripa is in the office today and that I will forward a message to both Dr. Jamey Ripa and Aundra Millet his assistant today.

## 2012-12-05 NOTE — Progress Notes (Signed)
Have placed orders to schedule for NL lumpectomy and sentinel node

## 2012-12-06 ENCOUNTER — Encounter (INDEPENDENT_AMBULATORY_CARE_PROVIDER_SITE_OTHER): Payer: Self-pay

## 2012-12-14 ENCOUNTER — Encounter (HOSPITAL_BASED_OUTPATIENT_CLINIC_OR_DEPARTMENT_OTHER): Payer: Self-pay | Admitting: *Deleted

## 2012-12-16 ENCOUNTER — Ambulatory Visit (HOSPITAL_COMMUNITY)
Admission: RE | Admit: 2012-12-16 | Discharge: 2012-12-16 | Disposition: A | Discharge status: Home / Self Care | Payer: BC Managed Care – PPO | Source: Ambulatory Visit | Attending: Surgery | Admitting: Surgery

## 2012-12-16 ENCOUNTER — Other Ambulatory Visit (INDEPENDENT_AMBULATORY_CARE_PROVIDER_SITE_OTHER): Payer: Self-pay | Admitting: Surgery

## 2012-12-16 DIAGNOSIS — C50919 Malignant neoplasm of unspecified site of unspecified female breast: Secondary | ICD-10-CM | POA: Insufficient documentation

## 2012-12-16 DIAGNOSIS — Z01811 Encounter for preprocedural respiratory examination: Secondary | ICD-10-CM

## 2012-12-16 DIAGNOSIS — Z01818 Encounter for other preprocedural examination: Secondary | ICD-10-CM | POA: Insufficient documentation

## 2012-12-21 ENCOUNTER — Ambulatory Visit
Admission: RE | Admit: 2012-12-21 | Discharge: 2012-12-21 | Disposition: A | Discharge status: Home / Self Care | Payer: BC Managed Care – PPO | Source: Ambulatory Visit | Attending: Surgery | Admitting: Surgery

## 2012-12-21 ENCOUNTER — Ambulatory Visit (HOSPITAL_BASED_OUTPATIENT_CLINIC_OR_DEPARTMENT_OTHER)
Admission: RE | Admit: 2012-12-21 | Discharge: 2012-12-21 | Disposition: A | Discharge status: Home / Self Care | Payer: BC Managed Care – PPO | Source: Ambulatory Visit | Attending: Surgery | Admitting: Surgery

## 2012-12-21 ENCOUNTER — Other Ambulatory Visit (INDEPENDENT_AMBULATORY_CARE_PROVIDER_SITE_OTHER): Payer: Self-pay

## 2012-12-21 ENCOUNTER — Encounter (HOSPITAL_BASED_OUTPATIENT_CLINIC_OR_DEPARTMENT_OTHER): Payer: Self-pay | Admitting: Anesthesiology

## 2012-12-21 ENCOUNTER — Encounter (HOSPITAL_BASED_OUTPATIENT_CLINIC_OR_DEPARTMENT_OTHER): Payer: Self-pay | Admitting: *Deleted

## 2012-12-21 ENCOUNTER — Encounter (HOSPITAL_COMMUNITY)
Admission: RE | Admit: 2012-12-21 | Discharge: 2012-12-21 | Disposition: A | Discharge status: Home / Self Care | Payer: BC Managed Care – PPO | Source: Ambulatory Visit | Attending: Surgery | Admitting: Surgery

## 2012-12-21 ENCOUNTER — Encounter (HOSPITAL_BASED_OUTPATIENT_CLINIC_OR_DEPARTMENT_OTHER)
Admission: RE | Disposition: A | Discharge status: Home / Self Care | Payer: Self-pay | Source: Ambulatory Visit | Attending: Surgery

## 2012-12-21 ENCOUNTER — Ambulatory Visit (HOSPITAL_BASED_OUTPATIENT_CLINIC_OR_DEPARTMENT_OTHER): Payer: BC Managed Care – PPO | Admitting: Anesthesiology

## 2012-12-21 DIAGNOSIS — C50912 Malignant neoplasm of unspecified site of left female breast: Secondary | ICD-10-CM

## 2012-12-21 DIAGNOSIS — C50419 Malignant neoplasm of upper-outer quadrant of unspecified female breast: Secondary | ICD-10-CM | POA: Insufficient documentation

## 2012-12-21 DIAGNOSIS — Z170 Estrogen receptor positive status [ER+]: Secondary | ICD-10-CM | POA: Insufficient documentation

## 2012-12-21 DIAGNOSIS — C50919 Malignant neoplasm of unspecified site of unspecified female breast: Secondary | ICD-10-CM

## 2012-12-21 HISTORY — PX: BREAST LUMPECTOMY WITH NEEDLE LOCALIZATION AND AXILLARY SENTINEL LYMPH NODE BX: SHX5760

## 2012-12-21 HISTORY — DX: Wedge compression fracture of fourth lumbar vertebra, initial encounter for closed fracture: S32.040A

## 2012-12-21 HISTORY — DX: Age-related osteoporosis without current pathological fracture: M81.0

## 2012-12-21 HISTORY — DX: Gout, unspecified: M10.9

## 2012-12-21 HISTORY — DX: Other chondrocalcinosis, unspecified ankle and foot: M11.279

## 2012-12-21 SURGERY — BREAST LUMPECTOMY WITH NEEDLE LOCALIZATION AND AXILLARY SENTINEL LYMPH NODE BX
Anesthesia: General | Site: Breast | Laterality: Left | Wound class: Clean

## 2012-12-21 MED ORDER — HYDROCODONE-ACETAMINOPHEN 5-325 MG PO TABS: 1.0000 | ORAL_TABLET | ORAL | Status: DC | PRN

## 2012-12-21 MED ORDER — OXYCODONE HCL 5 MG/5ML PO SOLN: 5.0000 mg | Freq: Once | ORAL | Status: DC | PRN

## 2012-12-21 MED ORDER — SODIUM CHLORIDE 0.9 % IJ SOLN
INTRAMUSCULAR | Status: DC | PRN
  Administered 2012-12-21: 11:00:00

## 2012-12-21 MED ORDER — HYDROCODONE-ACETAMINOPHEN 5-325 MG PO TABS
1.0000 | ORAL_TABLET | Freq: Once | ORAL | Status: AC
  Administered 2012-12-21: 1 via ORAL

## 2012-12-21 MED ORDER — MEPERIDINE HCL 25 MG/ML IJ SOLN: 6.2500 mg | INTRAMUSCULAR | Status: DC | PRN

## 2012-12-21 MED ORDER — OXYCODONE HCL 5 MG PO TABS: 5.0000 mg | ORAL_TABLET | Freq: Once | ORAL | Status: DC | PRN

## 2012-12-21 MED ORDER — MIDAZOLAM HCL 2 MG/2ML IJ SOLN
1.0000 mg | INTRAMUSCULAR | Status: DC | PRN
  Administered 2012-12-21 (×2): 1 mg via INTRAVENOUS

## 2012-12-21 MED ORDER — CHLORHEXIDINE GLUCONATE 4 % EX LIQD: 1.0000 "application " | Freq: Once | CUTANEOUS | Status: DC

## 2012-12-21 MED ORDER — LIDOCAINE HCL (CARDIAC) 20 MG/ML IV SOLN
INTRAVENOUS | Status: DC | PRN
  Administered 2012-12-21: 100 mg via INTRAVENOUS

## 2012-12-21 MED ORDER — ONDANSETRON HCL 4 MG/2ML IJ SOLN: 4.0000 mg | Freq: Once | INTRAMUSCULAR | Status: DC | PRN

## 2012-12-21 MED ORDER — CIPROFLOXACIN IN D5W 400 MG/200ML IV SOLN
400.0000 mg | INTRAVENOUS | Status: AC
  Administered 2012-12-21: 400 mg via INTRAVENOUS

## 2012-12-21 MED ORDER — PROPOFOL 10 MG/ML IV BOLUS
INTRAVENOUS | Status: DC | PRN
  Administered 2012-12-21: 160 mg via INTRAVENOUS

## 2012-12-21 MED ORDER — FENTANYL CITRATE 0.05 MG/ML IJ SOLN
50.0000 ug | INTRAMUSCULAR | Status: DC | PRN
  Administered 2012-12-21 (×2): 50 ug via INTRAVENOUS

## 2012-12-21 MED ORDER — LACTATED RINGERS IV SOLN
INTRAVENOUS | Status: DC
  Administered 2012-12-21 (×2): via INTRAVENOUS

## 2012-12-21 MED ORDER — ONDANSETRON HCL 4 MG/2ML IJ SOLN
INTRAMUSCULAR | Status: DC | PRN
  Administered 2012-12-21: 4 mg via INTRAVENOUS

## 2012-12-21 MED ORDER — MIDAZOLAM HCL 5 MG/5ML IJ SOLN
INTRAMUSCULAR | Status: DC | PRN
  Administered 2012-12-21: 1 mg via INTRAVENOUS

## 2012-12-21 MED ORDER — HYDROMORPHONE HCL PF 1 MG/ML IJ SOLN: 0.2500 mg | INTRAMUSCULAR | Status: DC | PRN

## 2012-12-21 MED ORDER — BUPIVACAINE HCL (PF) 0.25 % IJ SOLN
INTRAMUSCULAR | Status: DC | PRN
  Administered 2012-12-21: 20 mL

## 2012-12-21 MED ORDER — TECHNETIUM TC 99M SULFUR COLLOID FILTERED
1.0000 | Freq: Once | INTRAVENOUS | Status: AC | PRN
  Administered 2012-12-21: 1 via INTRADERMAL

## 2012-12-21 MED ORDER — DEXAMETHASONE SODIUM PHOSPHATE 4 MG/ML IJ SOLN
INTRAMUSCULAR | Status: DC | PRN
  Administered 2012-12-21: 10 mg via INTRAVENOUS

## 2012-12-21 MED ORDER — FENTANYL CITRATE 0.05 MG/ML IJ SOLN
INTRAMUSCULAR | Status: DC | PRN
  Administered 2012-12-21 (×2): 25 ug via INTRAVENOUS
  Administered 2012-12-21: 50 ug via INTRAVENOUS

## 2012-12-21 SURGICAL SUPPLY — 61 items
APPLIER CLIP 11 MED OPEN (CLIP)
APPLIER CLIP 9.375 MED OPEN (MISCELLANEOUS)
BLADE HEX COATED 2.75 (ELECTRODE) ×2 IMPLANT
BLADE SURG 15 STRL LF DISP TIS (BLADE) ×2 IMPLANT
BLADE SURG 15 STRL SS (BLADE) ×2
CANISTER SUCTION 1200CC (MISCELLANEOUS) ×2 IMPLANT
CHLORAPREP W/TINT 26ML (MISCELLANEOUS) ×2 IMPLANT
CLIP APPLIE 11 MED OPEN (CLIP) IMPLANT
CLIP APPLIE 9.375 MED OPEN (MISCELLANEOUS) IMPLANT
CLIP TI MEDIUM 6 (CLIP) IMPLANT
CLIP TI WIDE RED SMALL 6 (CLIP) ×2 IMPLANT
CLOTH BEACON ORANGE TIMEOUT ST (SAFETY) ×2 IMPLANT
COVER MAYO STAND STRL (DRAPES) ×2 IMPLANT
COVER PROBE 5X48 (MISCELLANEOUS) ×1
COVER PROBE W GEL 5X96 (DRAPES) ×2 IMPLANT
COVER TABLE BACK 60X90 (DRAPES) ×2 IMPLANT
DECANTER SPIKE VIAL GLASS SM (MISCELLANEOUS) IMPLANT
DERMABOND ADVANCED (GAUZE/BANDAGES/DRESSINGS) ×1
DERMABOND ADVANCED .7 DNX12 (GAUZE/BANDAGES/DRESSINGS) ×1 IMPLANT
DEVICE DUBIN W/COMP PLATE 8390 (MISCELLANEOUS) IMPLANT
DRAIN CHANNEL 19F RND (DRAIN) IMPLANT
DRAPE LAPAROSCOPIC ABDOMINAL (DRAPES) ×2 IMPLANT
DRAPE SURG 17X23 STRL (DRAPES) ×2 IMPLANT
DRAPE UTILITY XL STRL (DRAPES) ×2 IMPLANT
DRSG EMULSION OIL 3X3 NADH (GAUZE/BANDAGES/DRESSINGS) IMPLANT
ELECT BLADE 4.0 EZ CLEAN MEGAD (MISCELLANEOUS)
ELECT REM PT RETURN 9FT ADLT (ELECTROSURGICAL) ×2
ELECTRODE BLDE 4.0 EZ CLN MEGD (MISCELLANEOUS) IMPLANT
ELECTRODE REM PT RTRN 9FT ADLT (ELECTROSURGICAL) ×1 IMPLANT
EVACUATOR SILICONE 100CC (DRAIN) IMPLANT
GLOVE BIOGEL PI IND STRL 7.0 (GLOVE) ×1 IMPLANT
GLOVE BIOGEL PI INDICATOR 7.0 (GLOVE) ×1
GLOVE ECLIPSE 6.5 STRL STRAW (GLOVE) ×2 IMPLANT
GLOVE EUDERMIC 7 POWDERFREE (GLOVE) ×2 IMPLANT
GLOVE EXAM NITRILE MD LF STRL (GLOVE) ×2 IMPLANT
GOWN PREVENTION PLUS XLARGE (GOWN DISPOSABLE) ×4 IMPLANT
KIT CVR 48X5XPRB PLUP LF (MISCELLANEOUS) ×1 IMPLANT
KIT MARKER MARGIN INK (KITS) ×2 IMPLANT
NDL SAFETY ECLIPSE 18X1.5 (NEEDLE) ×1 IMPLANT
NEEDLE HYPO 18GX1.5 SHARP (NEEDLE) ×1
NEEDLE HYPO 25X1 1.5 SAFETY (NEEDLE) ×4 IMPLANT
NS IRRIG 1000ML POUR BTL (IV SOLUTION) ×2 IMPLANT
PACK BASIN DAY SURGERY FS (CUSTOM PROCEDURE TRAY) ×2 IMPLANT
PENCIL BUTTON HOLSTER BLD 10FT (ELECTRODE) ×2 IMPLANT
PIN SAFETY STERILE (MISCELLANEOUS) IMPLANT
SHEET MEDIUM DRAPE 40X70 STRL (DRAPES) ×2 IMPLANT
SLEEVE SCD COMPRESS KNEE MED (MISCELLANEOUS) ×2 IMPLANT
SPONGE GAUZE 4X4 12PLY (GAUZE/BANDAGES/DRESSINGS) IMPLANT
SPONGE INTESTINAL PEANUT (DISPOSABLE) IMPLANT
SPONGE LAP 18X18 X RAY DECT (DISPOSABLE) IMPLANT
SPONGE LAP 4X18 X RAY DECT (DISPOSABLE) ×2 IMPLANT
SUT ETHILON 2 0 FS 18 (SUTURE) IMPLANT
SUT ETHILON 3 0 FSL (SUTURE) IMPLANT
SUT MNCRL AB 4-0 PS2 18 (SUTURE) ×4 IMPLANT
SUT VIC AB 4-0 BRD 54 (SUTURE) IMPLANT
SUT VICRYL 3-0 CR8 SH (SUTURE) ×4 IMPLANT
SYR CONTROL 10ML LL (SYRINGE) ×4 IMPLANT
TOWEL OR 17X24 6PK STRL BLUE (TOWEL DISPOSABLE) ×2 IMPLANT
TOWEL OR NON WOVEN STRL DISP B (DISPOSABLE) ×2 IMPLANT
TUBE CONNECTING 20X1/4 (TUBING) ×2 IMPLANT
YANKAUER SUCT BULB TIP NO VENT (SUCTIONS) ×2 IMPLANT

## 2012-12-21 NOTE — H&P (Signed)
  Doris Bender       DOB: 1960/07/29           DATE: 12/06/2012       XBM:[id number]  CC: No chief complaint on file.   HPI: She was diagnosed a few weeks ago with a clinical stage 1 left breast cancer. She presnts today for lumpectomy and sentnel node. She has had no new medica issuessince her office visit with me.  EXAM: Vital signs: BP 96/65  Pulse 96  Temp(Src) 98.6 F (37 C) (Oral)  Resp 18  Ht 5\' 5"  (1.651 m)  Wt 134 lb 6 oz (60.952 kg)  BMI 22.36 kg/m2  SpO2 98%  General: Patient alert, oriented, NAD  Heart: Reg, No m,r,g Lungs: Normal respirations, clear to auscultation. Breast: guide wire in place left breast. IMP: Stage 1 left breast cancer   PLAN: Left lumpectomy and sentinel node.Reviewed plans with patient and she has no more questions. Marked the left breast as the operative side and reviewed the wire loc films.  Brendyn Mclaren J 12/21/2012

## 2012-12-21 NOTE — Transfer of Care (Signed)
Immediate Anesthesia Transfer of Care Note  Patient: Doris Bender  Procedure(s) Performed: Procedure(s) with comments: BREAST LUMPECTOMY WITH NEEDLE LOCALIZATION AND AXILLARY SENTINEL LYMPH NODE BX (Left) - needle local BCG  nuc med   Patient Location: PACU  Anesthesia Type:General  Level of Consciousness: awake and alert   Airway & Oxygen Therapy: Patient Spontanous Breathing and Patient connected to face mask oxygen  Post-op Assessment: Report given to PACU RN and Post -op Vital signs reviewed and stable  Post vital signs: Reviewed and stable  Complications: No apparent anesthesia complications

## 2012-12-21 NOTE — Anesthesia Postprocedure Evaluation (Signed)
Anesthesia Post Note  Patient: Doris Bender  Procedure(s) Performed: Procedure(s) (LRB): BREAST LUMPECTOMY WITH NEEDLE LOCALIZATION AND AXILLARY SENTINEL LYMPH NODE BX (Left)  Anesthesia type: general  Patient location: PACU  Post pain: Pain level controlled  Post assessment: Patient's Cardiovascular Status Stable  Last Vitals:  Filed Vitals:   12/21/12 1300  BP: 103/63  Pulse: 70  Temp:   Resp: 12    Post vital signs: Reviewed and stable  Level of consciousness: sedated  Complications: No apparent anesthesia complications

## 2012-12-21 NOTE — Anesthesia Preprocedure Evaluation (Signed)

## 2012-12-21 NOTE — Op Note (Signed)
Doris Bender Levengood 1960/10/23 [id number] 12/06/2012  Preoperative diagnosis: left breast cancer, upper outer quadrant, clinical stage I  Postoperative diagnosis: same  Procedure: wire localized left partial mastectomy with blue dye injection and axillary sentinel lymph node dissection  Surgeon: Currie Paris, MD, FACS   Anesthesia: General   Clinical History and Indications: this patient recently presented with a left breast cancer upper-outer quadrant. After discussion of alternatives we elected to proceed to wire localized lumpectomy and sentinel node removal    Description of Procedure: the patient is seen in the preoperative area and we confirmed that the plans. The left breast as marked as the operative site. Wire localizing films were reviewed.  The patient was taken to the operating room and after satisfactory gel anesthesia was obtained a timeout was done. I injected 5 cc of dilute methylene blue in the subareolar area on the left. A full prep and drape was then done. The guidewire entered high in the lateral upper-outer quadrant basically at the base of the axilla. It traveled from lateral to medial and the tumor was about 2 cm deep to the skin.  I made a transverse incision over the guidewire tract. It extended from several centimeters medial to the entry point about 1 cm lateral. I restrengthen skin flaps. I divided breast tissue superior to the guidewire down to the chest wall, and divided medially to the chest wall and then laterally down to the chest wall. The specimen was then taken off of the chest wall including fascia and going lateral to the guidewire into the site. I felt it was well around the tumor and the margins all normal. Ink was used to mark all of the margins.specimen mammogram showed the clip in the specimen.  Using a new probe I was able to find a hot area in the axilla using the same incision. I opened the clavipectoral fascia and fat a blue lymphatic and  traced to a blue lymph node which had counts of about 3300. There were no other palpably abnormal nodes. Another hot area a little more inferior was identified and removed although I couldn't tell whether this was primarily just lymphatic traveling to the main sentinel node or whether there was a small lymph node included.  I then infiltrated 0.25% plain Marcaine to help with postop pain relief. I irrigated and made sure everything was dry. I used clips to mark the margins of the lumpectomy cavity. I closed in layers with 3-0 Vicryl, 4-0 Monocryl subcuticular, plus Dermabond.  The patient tolerated the procedure well. There no operative complications. Counts were correct. Blood loss minimal.`  Currie Paris, MD, FACS 12/21/2012 12:11 PM

## 2012-12-21 NOTE — Progress Notes (Signed)
Emotional support during breast injections °

## 2012-12-22 ENCOUNTER — Encounter (HOSPITAL_BASED_OUTPATIENT_CLINIC_OR_DEPARTMENT_OTHER): Payer: Self-pay | Admitting: Surgery

## 2012-12-23 ENCOUNTER — Telehealth (INDEPENDENT_AMBULATORY_CARE_PROVIDER_SITE_OTHER): Payer: Self-pay

## 2012-12-23 ENCOUNTER — Telehealth: Payer: Self-pay | Admitting: *Deleted

## 2012-12-23 NOTE — Telephone Encounter (Signed)
Patient called for path results, and to ask if it is ok for her to go back to work next week. Advised that I would check with Dr Jamey Ripa assistant and see if it was okay. Advised that Dr Jamey Ripa has not seen the path yet and we would get in touch with her when it has been released. We will call her next week.

## 2012-12-23 NOTE — Telephone Encounter (Signed)
LMOM for pt letting her know that I have scheduled her 1st PO lumpectomy appointment for 9/5 @ 1040am

## 2012-12-23 NOTE — Telephone Encounter (Signed)
Spoke to pt concerning appt with med onc.  Scheduled pt to see Dr. Welton Flakes on 01/05/13 at 2:45.  Confirmed appt date and time.  Pt denies further needs at this time.  Gave pt instructions and contact information.

## 2012-12-26 ENCOUNTER — Encounter (INDEPENDENT_AMBULATORY_CARE_PROVIDER_SITE_OTHER): Payer: Self-pay | Admitting: *Deleted

## 2012-12-26 NOTE — Telephone Encounter (Signed)
Spoke to [employer] CMA who states Dr. Jamey Ripa sent her a message that stated margins benign and lymphnodes negative.  Patient updated with this at this time.  Note for patient to return to work on Thursday with activity as tolerated, do not lift more than 15lbs will be placed at front desk for patient to pick up this afternoon.

## 2012-12-26 NOTE — Telephone Encounter (Signed)
Patient called this morning to ask about her pathology results again and a return to work note.  Explained to patient that Dr. Jamey Ripa still has not reviewed the results and is unavailable this week. Will try to have another MD review results to see if we can release them to her and get approval for her to return to work on Thursday per patients request.  Patient states understanding and agreeable with plan at this time.

## 2013-01-04 ENCOUNTER — Other Ambulatory Visit: Payer: Self-pay | Admitting: Medical Oncology

## 2013-01-04 DIAGNOSIS — C50912 Malignant neoplasm of unspecified site of left female breast: Secondary | ICD-10-CM

## 2013-01-04 NOTE — Progress Notes (Signed)
Location of Breast Cancer: left  Histology per Pathology Report:  12/21/12 FINAL DIAGNOSIS Diagnosis 1. Breast, lumpectomy, left - INVASIVE DUCTAL CARCINOMA, SEE COMMENT. - INVASIVE TUMOR IS 0.3 CM FROM NEAREST MARGIN (POSTERIOR). - NO LYMPHOVASCULAR INVASION IDENTIFIED. - SEE TUMOR SYNOPTIC TEMPLATE BELOW. 2. Lymph node, sentinel, biopsy, left, axillary - ONE LYMPH NODE, NEGATIVE FOR TUMOR (0/1). 3. Lymph node, sentinel, biopsy, left, axilla - ONE LYMPH NODE, NEGATIVE FOR TUMOR (0/1).  Receptor Status: ER(+), PR (-), Her2-neu (-)  Did patient present with symptoms (if so, please note symptoms) or was this found on screening mammography?: routine mammogram June 2014  Past/Anticipated interventions by surgeon, if any: left lumpectomy 12/21/12, SN biopsy x 2  Past/Anticipated interventions by medical oncology, if any: Chemotherapy , new pt consult w/Dr Welton Flakes 01/05/13  Lymphedema issues, if any:  no  Pain issues, if any:   Post op soreness of left breast  SAFETY ISSUES:  Prior radiation?no  Pacemaker/ICD? no  Possible current pregnancy? no  Is the patient on methotrexate? no  Current Complaints / other details:  Post op visit Dr Jamey Ripa 01/06/13, has post op soreness, hx left breast biopsy age 18- benign. Taking Depo-Provera  every 3 months x 20 years    Glennie Hawk, RN 01/04/2013,3:59 PM

## 2013-01-05 ENCOUNTER — Ambulatory Visit (HOSPITAL_BASED_OUTPATIENT_CLINIC_OR_DEPARTMENT_OTHER): Payer: BC Managed Care – PPO | Admitting: Oncology

## 2013-01-05 ENCOUNTER — Encounter: Payer: Self-pay | Admitting: Radiation Oncology

## 2013-01-05 ENCOUNTER — Ambulatory Visit
Admission: RE | Admit: 2013-01-05 | Discharge: 2013-01-05 | Disposition: A | Discharge status: Home / Self Care | Payer: BC Managed Care – PPO | Source: Ambulatory Visit | Attending: Radiation Oncology | Admitting: Radiation Oncology

## 2013-01-05 ENCOUNTER — Ambulatory Visit: Payer: BC Managed Care – PPO | Admitting: Radiation Oncology

## 2013-01-05 ENCOUNTER — Ambulatory Visit: Payer: BC Managed Care – PPO

## 2013-01-05 ENCOUNTER — Encounter: Payer: Self-pay | Admitting: Oncology

## 2013-01-05 ENCOUNTER — Other Ambulatory Visit (HOSPITAL_BASED_OUTPATIENT_CLINIC_OR_DEPARTMENT_OTHER): Payer: BC Managed Care – PPO | Admitting: Lab

## 2013-01-05 ENCOUNTER — Telehealth: Payer: Self-pay | Admitting: *Deleted

## 2013-01-05 VITALS — BP 120/71 | HR 79 | Temp 98.7°F | Resp 20 | Ht 65.0 in | Wt 135.1 lb

## 2013-01-05 VITALS — BP 111/70 | HR 74 | Temp 99.4°F | Resp 20 | Wt 135.4 lb

## 2013-01-05 DIAGNOSIS — C50912 Malignant neoplasm of unspecified site of left female breast: Secondary | ICD-10-CM

## 2013-01-05 DIAGNOSIS — I7300 Raynaud's syndrome without gangrene: Secondary | ICD-10-CM | POA: Insufficient documentation

## 2013-01-05 DIAGNOSIS — C50919 Malignant neoplasm of unspecified site of unspecified female breast: Secondary | ICD-10-CM | POA: Insufficient documentation

## 2013-01-05 DIAGNOSIS — M810 Age-related osteoporosis without current pathological fracture: Secondary | ICD-10-CM | POA: Insufficient documentation

## 2013-01-05 HISTORY — DX: Malignant neoplasm of unspecified site of unspecified female breast: C50.919

## 2013-01-05 LAB — CBC WITH DIFFERENTIAL/PLATELET
Basophils Absolute: 0.1 10*3/uL (ref 0.0–0.1)
Eosinophils Absolute: 0.2 10*3/uL (ref 0.0–0.5)
HCT: 42.3 % (ref 34.8–46.6)
HGB: 14.3 g/dL (ref 11.6–15.9)
LYMPH%: 27.2 % (ref 14.0–49.7)
MCV: 91 fL (ref 79.5–101.0)
MONO#: 0.4 10*3/uL (ref 0.1–0.9)
MONO%: 6.1 % (ref 0.0–14.0)
NEUT#: 4 10*3/uL (ref 1.5–6.5)
NEUT%: 62.8 % (ref 38.4–76.8)
Platelets: 203 10*3/uL (ref 145–400)
RBC: 4.65 10*6/uL (ref 3.70–5.45)
WBC: 6.4 10*3/uL (ref 3.9–10.3)

## 2013-01-05 LAB — COMPREHENSIVE METABOLIC PANEL (CC13)
Alkaline Phosphatase: 89 U/L (ref 40–150)
BUN: 8.9 mg/dL (ref 7.0–26.0)
CO2: 27 mEq/L (ref 22–29)
Glucose: 139 mg/dl (ref 70–140)
Total Bilirubin: 0.55 mg/dL (ref 0.20–1.20)
Total Protein: 7.2 g/dL (ref 6.4–8.3)

## 2013-01-05 NOTE — Progress Notes (Signed)
Please see the Nurse Progress Note in the MD Initial Consult Encounter for this patient. 

## 2013-01-05 NOTE — Progress Notes (Signed)
Radiation Oncology         [phone] ________________________________  Initial outpatient Consultation  Name: Doris Bender MRN: [id number]  Date: 01/05/2013  DOB: 01-Jul-1960  JX:BJYNWGCaryn Bee, MD  Streck, Reola Mosher, MD , Drue Second, MD  REFERRING PHYSICIAN: Currie Paris, MD  DIAGNOSIS: Stage I invasive ductal carcinoma of the left breast, (pT1b, pNo, Mx)  HISTORY OF PRESENT ILLNESS::Doris Bender is a 52 y.o. female who is seen out of the courtesy of Dr. Cicero Duck for an opinion concerning radiation therapy as part of management of patient's recently diagnosed left breast cancer. Earlier this year on routine screening mammography the patient was noted to have a suspicious area in the upper outer aspect of the left breast. A biopsy was performed which revealed invasive ductal carcinoma. An MRI was performed which confirmed a solitary lesion in the upper outer aspect of the left breast.   the patient was seen by Dr. Jamey Ripa and was felt to be a good candidate for partial mastectomy and sentinel node procedure.  on 12/21/2012 the patient underwent a wire localized left partial mastectomy with blue dye injection and axillary sentinel lymph node dissection. A 0.7 cm tumor was removed from the left breast with clear margins. The closest margin was posterior at 0.3 cm. The tumor was low-grade. Estrogen receptor on the initial biopsy was 90% progesterone receptor was 0%. There was no HER-2/neu amplification.  Ki67 was 12%.  the patient has done well since her surgery. She is now seen in radiation oncology for consideration for breast conserving therapy.Marland Kitchen  PREVIOUS RADIATION THERAPY: No  PAST MEDICAL HISTORY:  has a past medical history of No pertinent past medical history; Contact lens/glasses fitting; Wears partial dentures; Cancer; Compression fracture of L4 lumbar vertebra (11/04/2012); Raynaud's disease; Arthritis; Osteoporosis; Pseudogout of joint of left foot (6 yrs ago);  Gout (8-9 yrs ago); and Breast cancer (11/15/12).    PAST SURGICAL HISTORY: Past Surgical History  Procedure Laterality Date  . Wrist ganglion excision      left x2  . Tonsillectomy    . Knee arthroscopy  2006    right  . Cervical conization w/bx  2012    benign  . Lateral epicondyle release  03/08/2012    Procedure: TENNIS ELBOW RELEASE;  Surgeon: Wyn Forster., MD;  Location: [facility];  Service: Orthopedics;  Laterality: Right;  RECONSTRUCTION OF RIGHT LATERAL ELBOW WITH TENDONS  . Mass excision  03/08/2012    Procedure: EXCISION MASS;  Surgeon: Wyn Forster., MD;  Location: [facility];  Service: Orthopedics;  Laterality: Right;  EXCISION OF RIGHT WRIST DORSAL CYST  . Tonsillectomy      52yrs old  . Breast surgery  age 39    left breast bx- benign  . Breast lumpectomy with needle localization and axillary sentinel lymph node bx Left 12/21/2012    Procedure: BREAST LUMPECTOMY WITH NEEDLE LOCALIZATION AND AXILLARY SENTINEL LYMPH NODE BX;  Surgeon: Currie Paris, MD;  Location: [facility];  Service: General;  Laterality: Left;  needle local BCG      FAMILY HISTORY: family history includes Cirrhosis in her father; Diabetes in her brother; Heart attack in her maternal grandfather and maternal grandmother; Hyperthyroidism in her mother.  SOCIAL HISTORY:  reports that she has been smoking.  She has never used smokeless tobacco. She reports that  drinks alcohol. She reports that she does not use illicit drugs.  ALLERGIES: Ibuprofen; Keflex;  Ketorolac; Lodine; Other; Oxycodone; and Robaxin  MEDICATIONS:  Current Outpatient Prescriptions  Medication Sig Dispense Refill  . ALPRAZolam (XANAX) 0.25 MG tablet Take 0.25 mg by mouth at bedtime as needed for sleep.      Marland Kitchen HYDROcodone-acetaminophen (NORCO) 5-325 MG per tablet Take 1 tablet by mouth every 4 (four) hours as needed for pain.  30 tablet  0  . medroxyPROGESTERone  (DEPO-PROVERA) 400 MG/ML SUSP Inject 400 mg into the muscle every 3 (three) months.       . naproxen sodium (ANAPROX) 220 MG tablet Take 220 mg by mouth 2 (two) times daily with a meal.       No current facility-administered medications for this encounter.    REVIEW OF SYSTEMS:  A 15 point review of systems is documented in the electronic medical record. This was obtained by the nursing staff. However, I reviewed this with the patient to discuss relevant findings and make appropriate changes.  She has some mild soreness in the surgical site but otherwise is doing well. Prior to diagnosis the patient denied any pain in the breast area nipple discharge or bleeding. She denies any new bony pain headaches dizziness or blurred vision.   PHYSICAL EXAM:  weight is 135 lb 6.4 oz (61.417 kg). Her temperature is 99.4 F (37.4 C). Her blood pressure is 111/70 and her pulse is 74. Her respiration is 20.   BP 111/70  Pulse 74  Temp(Src) 99.4 F (37.4 C)  Resp 20  Wt 135 lb 6.4 oz (61.417 kg)  BMI 22.53 kg/m2  General Appearance:    Alert, cooperative, no distress, appears stated age  Head:    Normocephalic, without obvious abnormality, atraumatic  Eyes:    PERRL, conjunctiva/corneas clear, EOM's intact,    Ears:    Normal TM's and external ear canals, both ears  Nose:   Nares normal, septum midline, mucosa normal, no drainage    or sinus tenderness  Throat:   Lips, mucosa, and tongue normal; teeth and gums normal  Neck:   Supple, symmetrical, trachea midline, no adenopathy;    thyroid:  no enlargement/tenderness/nodules; no carotid   bruit or JVD  Back:     Symmetric, no curvature, ROM normal, no CVA tenderness  Lungs:     Clear to auscultation bilaterally, respirations unlabored  Chest Wall:    No tenderness or deformity   Heart:    Regular rate and rhythm, S1 and S2 normal, no murmur, rub   or gallop  Breast Exam   No tenderness, masses, or nipple abnormality involving the right breast, the left  breast shows a scar in the upper outer quadrant which incorporates her lumpectomy and sentinel node. there is no dominant mass appreciated in the left breast nipple discharge or bleeding.   Abdomen:     Soft, non-tender, bowel sounds active all four quadrants,    no masses, no organomegaly        Extremities:   Extremities normal, atraumatic, no cyanosis or edema  Pulses:   2+ and symmetric all extremities  Skin:   Skin color, texture, turgor normal, no rashes or lesions  Lymph nodes:   Cervical, supraclavicular, and axillary nodes normal  Neurologic:   normal strength, sensation and reflexes    throughout    KPS = 100  100 - Normal; no complaints; no evidence of disease. 90   - Able to carry on normal activity; minor signs or symptoms of disease. 80   - Normal activity with effort;  some signs or symptoms of disease. 79   - Cares for self; unable to carry on normal activity or to do active work. 60   - Requires occasional assistance, but is able to care for most of his personal needs. 50   - Requires considerable assistance and frequent medical care. 40   - Disabled; requires special care and assistance. 30   - Severely disabled; hospital admission is indicated although death not imminent. 20   - Very sick; hospital admission necessary; active supportive treatment necessary. 10   - Moribund; fatal processes progressing rapidly. 0     - Dead  Karnofsky DA, Abelmann WH, Craver LS and [facility] [phone]) The use of the nitrogen mustards in the palliative treatment of carcinoma: with particular reference to bronchogenic carcinoma Cancer 1 634-56  LABORATORY DATA:  Lab Results  Component Value Date   WBC 6.4 01/05/2013   HGB 14.3 01/05/2013   HCT 42.3 01/05/2013   MCV 91.0 01/05/2013   PLT 203 01/05/2013   Lab Results  Component Value Date   NA 144 01/05/2013   K 4.6 01/05/2013   CO2 27 01/05/2013   Lab Results  Component Value Date   ALT 13 01/05/2013   AST 13 01/05/2013   ALKPHOS 89 01/05/2013    BILITOT 0.55 01/05/2013     RADIOGRAPHY: Dg Chest 2 View  12/16/2012   *RADIOLOGY REPORT*  Clinical Data: 52 year old female preoperative study.  Left breast cancer.  CHEST - 2 VIEW  Comparison: None.  Findings: Lung volumes are at the upper limits of normal.  Cardiac size and mediastinal contours are within normal limits.  Visualized tracheal air column is within normal limits.  No pneumothorax or pleural effusion.  Mild pectus exit bottom.  No consolidation or confluent pulmonary opacity.  Mild increased interstitial markings diffusely. No acute osseous abnormality identified.  IMPRESSION: No acute cardiopulmonary abnormality.   Original Report Authenticated By: Erskine Speed, M.D.   Nm Sentinel Node Inj-no Rpt (breast)  12/21/2012   CLINICAL DATA: left breast cancer   Sulfur colloid was injected intradermally by the nuclear medicine  technologist for breast cancer sentinel node localization.    Mm Lt Plc Breast Loc Dev   1st Lesion  Inc Mammo Guide  12/21/2012   *RADIOLOGY REPORT*  Clinical Data: 52 year old female with left breast carcinoma - wire localization prior to left lumpectomy.  NEEDLE LOCALIZATION WITH MAMMOGRAPHIC GUIDANCE AND SPECIMEN RADIOGRAPH  Comparison:  Previous exam(s).  Patient presents for needle localization prior to left lumpectomy. I met with the patient and we discussed the procedure of needle localization including benefits and alternatives. We discussed the high likelihood of a successful procedure. We discussed the risks of the procedure, including infection, bleeding, tissue injury, and further surgery. Informed, written consent was given. The usual time-out protocol was performed immediately prior to the procedure.  Using mammographic guidance, sterile technique, 2% lidocaine and a 7 cm modified Kopans needle the biopsy clip was localized using a lateral approach.  The films are marked for Dr. Jamey Ripa. Specimen radiograph was performed at day surgery and confirms the biopsy  clip and wire present in the tissue sample.  The specimen is marked for pathology.  IMPRESSION: Needle localization left breast.  No apparent complications.   Original Report Authenticated By: Harmon Pier, M.D.      IMPRESSION: Stage I invasive ductal carcinoma of the left breast, (pT1b, No, Mx).  the patient would be an excellent candidate for breast conservation with radiation therapy  directed to the left breast region. I discussed the overall treatment course side effects and potential toxicities of radiation therapy in this situation with the patient. She does wish to proceed with radiation therapy as part of breast conserving treatment. Patient works in the [place] area and lives in [place] and may wish to have her radiation therapy at the [facility].  Patient's let me know of her wishes prior to scheduling her simulation. Patient will meet with Dr. Welton Flakes this week. I am unsure whether the patient will proceed with Oncotype DX testing with consideration for adjuvant chemotherapy if this test shows a high risk of recurrence. Otherwise the patient should be ready for simulation in late September.  PLAN: I will await medical oncology input prior to scheduling simulation. I'll also a wait on the patient's input concerning location for treatment.  I spent 60 minutes minutes face to face with the patient and more than 50% of that time was spent in counseling and/or coordination of care.   ------------------------------------------------  -----------------------------------  Billie Lade, PhD, MD

## 2013-01-05 NOTE — Progress Notes (Signed)
Spoke with patient today after her visit with Dr. Welton Flakes.  Contact information given.  No questions or concerns at this time.

## 2013-01-05 NOTE — Telephone Encounter (Signed)
appts made and printed...td 

## 2013-01-05 NOTE — Patient Instructions (Addendum)
Proceed with radiation therapy  I will see you back after completion of radiation.  Please discontinue depo injections

## 2013-01-06 ENCOUNTER — Telehealth (INDEPENDENT_AMBULATORY_CARE_PROVIDER_SITE_OTHER): Payer: Self-pay | Admitting: General Surgery

## 2013-01-06 ENCOUNTER — Ambulatory Visit (INDEPENDENT_AMBULATORY_CARE_PROVIDER_SITE_OTHER): Payer: BC Managed Care – PPO | Admitting: Surgery

## 2013-01-06 ENCOUNTER — Encounter (INDEPENDENT_AMBULATORY_CARE_PROVIDER_SITE_OTHER): Payer: Self-pay | Admitting: Surgery

## 2013-01-06 VITALS — BP 96/60 | HR 64 | Temp 96.3°F | Ht 65.5 in | Wt 134.0 lb

## 2013-01-06 DIAGNOSIS — C50919 Malignant neoplasm of unspecified site of unspecified female breast: Secondary | ICD-10-CM

## 2013-01-06 DIAGNOSIS — C50912 Malignant neoplasm of unspecified site of left female breast: Secondary | ICD-10-CM

## 2013-01-06 DIAGNOSIS — Z09 Encounter for follow-up examination after completed treatment for conditions other than malignant neoplasm: Secondary | ICD-10-CM

## 2013-01-06 NOTE — Patient Instructions (Signed)
See me again in about 3 months, after finishing radiation therapy

## 2013-01-06 NOTE — Telephone Encounter (Signed)
Pt called to ask if she can get in bathtub now.  Seen in office today, but she forgot to ask the MD.  Had lumpectomy >2 weeks ago; denies open wound.  States okay to bathe now.

## 2013-01-06 NOTE — Progress Notes (Signed)
NAME: Doris Bender                                            DOB: 1961/03/12 DATE: 01/06/2013                                                  MRN: [id number]  CC:  Chief Complaint  Patient presents with  . Routine Post Op    Lt br lumpectomy    HPI: This patient comes in for post op follow-up .Sheunderwent left lumpectomy and SLN on 12/21/12. She feels that she is doing well.  PE:  VITAL SIGNS: BP 96/60  Pulse 64  Temp(Src) 96.3 F (35.7 C) (Temporal)  Ht 5' 5.5" (1.664 m)  Wt 134 lb (60.782 kg)  BMI 21.95 kg/m2  General: The patient appears to be healthy, NAD Incision: Healing nicely. She has a single incision for the lumpectomy and sentinel mode. The wound is healing nicely. There is a fair amount of thickening but I don't think there is any fluid present. A site typical postoperative changes. Otherwise doing well.  DATA REVIEWED: Path: 1. CHROMOGENIC IN-SITU HYBRIDIZATION Results: HER-2/NEU BY CISH - NO AMPLIFICATION OF HER-2 DETECTED. RESULT RATIO OF HER2: CEP 17 SIGNALS 1.28 AVERAGE HER2 COPY NUMBER PER CELL 1.85 REFERENCE RANGE NEGATIVE HER2/Chr17 Ratio <2.0 and Average HER2 copy number <4.0 EQUIVOCAL HER2/Chr17 Ratio <2.0 and Average HER2 copy number 4.0 and <6.0 POSITIVE HER2/Chr17 Ratio >=2.0 and/or Average HER2 copy number >=6.0 Pecola Leisure MD Pathologist, Electronic Signature ( Signed 12/28/2012) FINAL DIAGNOSIS Diagnosis 1. Breast, lumpectomy, left - INVASIVE DUCTAL CARCINOMA, SEE COMMENT. - INVASIVE TUMOR IS 0.3 CM FROM NEAREST MARGIN (POSTERIOR). - NO LYMPHOVASCULAR INVASION IDENTIFIED. - SEE TUMOR SYNOPTIC TEMPLATE BELOW. 2. Lymph node, sentinel, biopsy, left, axillary - ONE LYMPH NODE, NEGATIVE FOR TUMOR (0/1). 3. Lymph node, sentinel, biopsy, left, axilla - ONE LYMPH NODE, NEGATIVE FOR TUMOR (0/1).  IMPRESSION: The patient is doing well S/P left lumpectomy and sentinel node evaluation.    PLAN: She has already seen a medical oncologist  and radiation oncologist. She'll be starting radiation about 3 weeks so I will see her at the completion of that. I gave the patient a copy of the pathology report and reviewed it with her

## 2013-01-21 NOTE — Progress Notes (Signed)
Doris Bender [id number] [birth year] 52 y.o. 01/21/2013 8:21 PM  CC  [name], MD [id number] [place] [address] Dr. Cyndia Bent Dr. Antony Blackbird  REASON FOR CONSULTATION:  52 year old female with new diagnosis of stage I left breast cancer. Patient is seen in medical oncology for discussion of treatment options.  STAGE:  Left breast T1 N0 ER positive, PR negative, Ki-6712%  REFERRING PHYSICIAN: Dr. Cyndia Bent  HISTORY OF PRESENT ILLNESS:  Doris Bender is a 52 y.o. female.  Earlier this year had a routine screening mammogram performed that showed a suspicious area in the upper outer aspect of the left breast. 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 Subsequent MRI confirmed a solitary lesion in the upper outer aspect of the left breast. Patient went on to have a partial mastectomy and sentinel lymph node biopsy on 12/21/2012. The final pathology revealed a 0.7 cm tumor all margins were negative. Tumor was low grade estrogen receptor 90% progesterone receptor negative HER-2/neu negative with a Ki-67 12%. Postoperatively patient has done well. She is now seen in medical oncology for treatment options. She is also scheduled to be seen by radiation oncology. She is without any complaints.   Past Medical History: Past Medical History  Diagnosis Date  . No pertinent past medical history   . Contact lens/glasses fitting     wears contacts or glasses  . Wears partial dentures     partial lower  . Cancer     breast  . Compression fracture of L4 lumbar vertebra 11/04/2012    injury  . Raynaud's disease     bilateral hands and feet  . Arthritis     lt great toe (RA)  . Osteoporosis   . Pseudogout of joint of left foot 6 yrs ago  . Gout 8-9 yrs ago    Rt heel/foot  . Breast cancer 11/15/12    left    Past Surgical History: Past Surgical History  Procedure Laterality Date  . Wrist ganglion excision      left x2  . Tonsillectomy     . Knee arthroscopy  2006    right  . Cervical conization w/bx  2012    benign  . Lateral epicondyle release  03/08/2012    Procedure: TENNIS ELBOW RELEASE;  Surgeon: Wyn Forster., MD;  Location: [facility];  Service: Orthopedics;  Laterality: Right;  RECONSTRUCTION OF RIGHT LATERAL ELBOW WITH TENDONS  . Mass excision  03/08/2012    Procedure: EXCISION MASS;  Surgeon: Wyn Forster., MD;  Location: [facility];  Service: Orthopedics;  Laterality: Right;  EXCISION OF RIGHT WRIST DORSAL CYST  . Tonsillectomy      52yrs old  . Breast surgery  age 33    left breast bx- benign  . Breast lumpectomy with needle localization and axillary sentinel lymph node bx Left 12/21/2012    Procedure: BREAST LUMPECTOMY WITH NEEDLE LOCALIZATION AND AXILLARY SENTINEL LYMPH NODE BX;  Surgeon: Currie Paris, MD;  Location: [facility];  Service: General;  Laterality: Left;  needle local BCG      Family History: Family History  Problem Relation Age of Onset  . Hyperthyroidism Mother   . Cirrhosis Father   . Diabetes Brother   . Heart attack Maternal Grandmother   . Heart attack Maternal Grandfather     Social History History  Substance Use Topics  . Smoking status: Current Every Day Smoker --  1.00 packs/day for 20 years  . Smokeless tobacco: Never Used  . Alcohol Use: Yes     Comment: rare    Allergies: Allergies  Allergen Reactions  . Ibuprofen Hives  . Keflex [Cephalexin] Hives  . Ketorolac Hives  . Lodine [Etodolac] Hives  . Other     Ibegrofon: this may be a misspelling of IBUUPROFEN per patient  . Oxycodone Nausea And Vomiting  . Robaxin [Methocarbamol] Hives    Current Medications: Current Outpatient Prescriptions  Medication Sig Dispense Refill  . medroxyPROGESTERone (DEPO-PROVERA) 400 MG/ML SUSP Inject 400 mg into the muscle every 3 (three) months.        No current facility-administered medications for this visit.     OB/GYN History:    Fertility Discussion:not applicable Prior History of Cancer: no  Health Maintenance:  Colonoscopy unknown Bone Density unknown Last PAP smearunknown  ECOG PERFORMANCE STATUS: 0 - Asymptomatic  Genetic Counseling/testing: no  REVIEW OF SYSTEMS:  Constitutional: negative Ears, nose, mouth, throat, and face: negative Respiratory: negative Cardiovascular: negative Gastrointestinal: negative Genitourinary:negative Integument/breast: positive for breast tenderness Hematologic/lymphatic: negative Musculoskeletal:positive for arthralgias, bone pain and stiff joints Neurological: negative  PHYSICAL EXAMINATION: Blood pressure 120/71, pulse 79, temperature 98.7 F (37.1 C), temperature source Oral, resp. rate 20, height 5\' 5"  (1.651 m), weight 135 lb 1.6 oz (61.281 kg).  XBJ:YNWGN, healthy, no distress, well nourished and well developed SKIN: skin color, texture, turgor are normal HEAD: Normocephalic EYES: PERRLA, EOMI, Conjunctiva are pink and non-injected EARS: External ears normal OROPHARYNX:no exudate, no erythema and lips, buccal mucosa, and tongue normal  NECK: supple, no adenopathy, thyroid normal size, non-tender, without nodularity LYMPH:  no palpable lymphadenopathy, no hepatosplenomegaly, no cervical supraclavicular or axillary adenopathy BREAST:right breast normal without mass, skin or nipple changes or axillary nodes, surgical scars noted in the left breast no evidence of infections no nodularity. No nipple discharge. LUNGS: clear to auscultation and percussion HEART: regular rate & rhythm, no murmurs and no gallops ABDOMEN:abdomen soft, non-tender, normal bowel sounds and no masses or organomegaly BACK: Back symmetric, no curvature., No CVA tenderness EXTREMITIES:no edema, no clubbing, no cyanosis  NEURO: alert & oriented x 3 with fluent speech, no focal motor/sensory deficits, gait normal, reflexes normal and  symmetric     STUDIES/RESULTS: No results found.   LABS:    Chemistry      Component Value Date/Time   NA 144 01/05/2013 1409   K 4.6 01/05/2013 1409   CO2 27 01/05/2013 1409   BUN 8.9 01/05/2013 1409   CREATININE 0.9 01/05/2013 1409      Component Value Date/Time   CALCIUM 9.7 01/05/2013 1409   ALKPHOS 89 01/05/2013 1409   AST 13 01/05/2013 1409   ALT 13 01/05/2013 1409   BILITOT 0.55 01/05/2013 1409      Lab Results  Component Value Date   WBC 6.4 01/05/2013   HGB 14.3 01/05/2013   HCT 42.3 01/05/2013   MCV 91.0 01/05/2013   PLT 203 01/05/2013   PATHOLOGY: ADDITIONAL INFORMATION: 1. CHROMOGENIC IN-SITU HYBRIDIZATION Results: HER-2/NEU BY CISH - NO AMPLIFICATION OF HER-2 DETECTED. RESULT RATIO OF HER2: CEP 17 SIGNALS 1.28 AVERAGE HER2 COPY NUMBER PER CELL 1.85 REFERENCE RANGE NEGATIVE HER2/Chr17 Ratio <2.0 and Average HER2 copy number <4.0 EQUIVOCAL HER2/Chr17 Ratio <2.0 and Average HER2 copy number 4.0 and <6.0 POSITIVE HER2/Chr17 Ratio >=2.0 and/or Average HER2 copy number >=6.0 Pecola Leisure MD Pathologist, Electronic Signature ( Signed 12/28/2012) FINAL DIAGNOSIS Diagnosis 1. Breast, lumpectomy, left - INVASIVE DUCTAL CARCINOMA,  SEE COMMENT. - INVASIVE TUMOR IS 0.3 CM FROM NEAREST MARGIN (POSTERIOR). - NO LYMPHOVASCULAR INVASION IDENTIFIED. - SEE TUMOR SYNOPTIC TEMPLATE BELOW. 2. Lymph node, sentinel, biopsy, left, axillary - ONE LYMPH NODE, NEGATIVE FOR TUMOR (0/1). 3. Lymph node, sentinel, biopsy, left, axilla - ONE LYMPH NODE, NEGATIVE FOR TUMOR (0/1). Microscopic Comment 1. BREAST, INVASIVE TUMOR, WITH LYMPH NODE SAMPLING 1 of 3 FINAL for Doris Bender, Doris Bender [phone]) Microscopic Comment(continued) Specimen, including laterality: Left breast. Procedure: Lumpectomy. Grade: I of III Tubule formation: 1 Nuclear pleomorphism: 1 Mitotic:1 Tumor size (gross measurement): 0.7 cm Margins: Invasive, distance to closest margin: 0.3 cm In-situ, distance to closest margin:  N/A If margin positive, focally or broadly: N/A Lymphovascular invasion: Absent. Ductal carcinoma in situ: Absent. Grade: N/A Extensive intraductal component: N/A Lobular neoplasia: Absent. Tumor focality: Unifocal Treatment effect: None If present, treatment effect in breast tissue, lymph nodes or both: N/A Extent of tumor: Skin: N/A Nipple: N/A Skeletal muscle: N/A Lymph nodes: # examined: 2 Lymph nodes with metastasis: 0 Isolated tumor cells (< 0.2 mm): N/A Micrometastasis: (> 0.2 mm and < 2.0 mm): N/A Macrometastasis: (> 2.0 mm): N/A Extracapsular extension: N/A Breast prognostic profile: Estrogen receptor: Not repeated, previous biopsy demonstrated 90% positivity (outside report) Progesterone receptor: Not repeated, previous biopsy demonstrated 0% positivity (outside report) Her 2 neu: Repeated, previous study demonstrated no amplification (outside report). Ki-67: Not repeated, previous study demonstrated 12% proliferation rate (outside report) Non-neoplastic breast: Previous biopsy site, microcalcifications, and fibrocystic change. 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 Final pathology revealed a 0.7 cm low-grade invasive ductal carcinoma that was ER positive PR negative with a low Ki-67 of 12%. Postoperatively she is doing well. Patient is an excellent candidate for radiation and antiestrogen therapy. I do not believe that she needs chemotherapy due to the low risk nature of her disease. Her overall prognosis is excellent.   #2 patient is on Depo injections and I recommended that she discontinue this.  #3 patient does have history of relapse disease.  #4 she also has history of osteoporosis and arthritis. I do  think this is going to make it a little difficult for the kind of antiestrogen therapy I may be able to give to her. Certainly she could receive tamoxifen or an aromatase inhibitor. Each patient has different side effects I would discuss this today. If she has not had a bone density scan she will need a bone density scan if we do plan to give aromatase inhibitors. This was discussed with her.    Clinical Trial Eligibility:no Multidisciplinary conference discussionto     PLAN:    #1 proceed with radiation therapy and she has our treatment with radiation oncology in [place].  #2 I will plan on seeing the patient back after completion of radiation.  #3 patient will discontinue the Depo injections.        Discussion: Patient is being treated per NCCN breast cancer care guidelines appropriate for stage.1   Thank you so much for allowing me to participate in the care of Doris Bender. I will continue to follow up the patient with you and assist in her care.  All questions were answered. The patient knows to call the clinic with any problems, questions or concerns. We  can certainly see the patient much sooner if necessary.  I spent 55 minutes counseling the patient face to face. The total time spent in the appointment was 60 minutes.  Drue Second, MD Medical/Oncology [facility] [phone] (beeper) [phone] (Office)

## 2013-01-30 ENCOUNTER — Encounter (INDEPENDENT_AMBULATORY_CARE_PROVIDER_SITE_OTHER): Payer: Self-pay

## 2013-01-30 ENCOUNTER — Telehealth: Payer: Self-pay | Admitting: Emergency Medicine

## 2013-01-30 NOTE — Telephone Encounter (Signed)
Greg from [facility] called this office to inquire if it was possible to use thermal modalities for lower back pain. Patient was referred to PT by PCP, but therapist wanted to clear treatment with Dr Welton Flakes as well due to cancer history. Per Dr Welton Flakes, ok to use thermal PT treatment for lower back pain. Left message with Tammy Sours on his cell # provided [phone].

## 2013-02-09 ENCOUNTER — Ambulatory Visit
Admission: RE | Admit: 2013-02-09 | Discharge: 2013-02-09 | Disposition: A | Discharge status: Home / Self Care | Payer: BC Managed Care – PPO | Source: Ambulatory Visit | Attending: Radiation Oncology | Admitting: Radiation Oncology

## 2013-02-09 DIAGNOSIS — C50912 Malignant neoplasm of unspecified site of left female breast: Secondary | ICD-10-CM

## 2013-02-09 DIAGNOSIS — IMO0002 Reserved for concepts with insufficient information to code with codable children: Secondary | ICD-10-CM | POA: Insufficient documentation

## 2013-02-09 DIAGNOSIS — C50919 Malignant neoplasm of unspecified site of unspecified female breast: Secondary | ICD-10-CM | POA: Insufficient documentation

## 2013-02-09 DIAGNOSIS — R5381 Other malaise: Secondary | ICD-10-CM | POA: Insufficient documentation

## 2013-02-09 DIAGNOSIS — L589 Radiodermatitis, unspecified: Secondary | ICD-10-CM | POA: Insufficient documentation

## 2013-02-09 DIAGNOSIS — R112 Nausea with vomiting, unspecified: Secondary | ICD-10-CM | POA: Insufficient documentation

## 2013-02-09 DIAGNOSIS — Z510 Encounter for antineoplastic radiation therapy: Secondary | ICD-10-CM | POA: Insufficient documentation

## 2013-02-09 DIAGNOSIS — Y842 Radiological procedure and radiotherapy as the cause of abnormal reaction of the patient, or of later complication, without mention of misadventure at the time of the procedure: Secondary | ICD-10-CM | POA: Insufficient documentation

## 2013-02-09 DIAGNOSIS — Z79899 Other long term (current) drug therapy: Secondary | ICD-10-CM | POA: Insufficient documentation

## 2013-02-09 NOTE — Progress Notes (Signed)
  Radiation Oncology         [phone] ________________________________  Name: Doris Bender MRN: [id number]  Date: 02/09/2013  DOB: 1960-12-14  SIMULATION AND TREATMENT PLANNING NOTE  DIAGNOSIS:  Stage I invasive ductal carcinoma of the left breast, (pT1b, pNo, Mx)   NARRATIVE:  The patient was brought to the CT Simulation planning suite.  Identity was confirmed.  All relevant records and images related to the planned course of therapy were reviewed.  The patient freely provided informed written consent to proceed with treatment after reviewing the details related to the planned course of therapy. The consent form was witnessed and verified by the simulation staff.  Then, the patient was set-up in a stable reproducible  supine position for radiation therapy.  CT images were obtained.  Surface markings were placed.  The CT images were loaded into the planning software.  Then the target and avoidance structures were contoured.  Treatment planning then occurred.  The radiation prescription was entered and confirmed.  Then, I designed and supervised the construction of a total of 3 medically necessary complex treatment devices.  I have requested : 3D Simulation  I have requested a DVH of the following structures: Lumpectomy cavity, heart, lungs (/v5,V20).  I have ordered:dose calc.  PLAN:  The patient will receive 50.4 Gy in 28 fractions followed by a boost to the lumpectomy cavity to 62.4 Gy.  ________________________________ -----------------------------------  Billie Lade, PhD, MD

## 2013-02-09 NOTE — Progress Notes (Signed)
  Radiation Oncology         [phone] ________________________________  Name: Doris Bender MRN: [id number]  Date: 02/09/2013  DOB: 03-Jun-1960  RESPIRATORY MOTION MANAGEMENT SIMULATION  NARRATIVE:  In order to account for effect of respiratory motion on target structures and other organs in the planning and delivery of radiotherapy, this patient underwent respiratory motion management simulation.  To accomplish this, when the patient was brought to the CT simulation planning suite, 4D respiratoy motion management CT images were obtained.  The CT images were loaded into the planning software.  Then, using a variety of tools including Cine, MIP, and standard views, the target volume and planning target volumes (PTV) were delineated.  Avoidance structures were contoured.  Treatment planning then occurred.  Dose volume histograms were generated and reviewed for each of the requested structure.  The resulting plan was carefully reviewed and approved today.  -----------------------------------  Billie Lade, PhD, MD

## 2013-02-17 ENCOUNTER — Ambulatory Visit
Admission: RE | Admit: 2013-02-17 | Discharge: 2013-02-17 | Disposition: A | Discharge status: Home / Self Care | Payer: BC Managed Care – PPO | Source: Ambulatory Visit | Attending: Radiation Oncology | Admitting: Radiation Oncology

## 2013-02-17 ENCOUNTER — Ambulatory Visit: Payer: BC Managed Care – PPO | Admitting: Radiation Oncology

## 2013-02-17 DIAGNOSIS — C50912 Malignant neoplasm of unspecified site of left female breast: Secondary | ICD-10-CM

## 2013-02-17 MED ORDER — ALRA NON-METALLIC DEODORANT (RAD-ONC)
1.0000 "application " | Freq: Once | TOPICAL | Status: AC
  Administered 2013-02-17: 1 via TOPICAL

## 2013-02-17 MED ORDER — RADIAPLEXRX EX GEL
Freq: Once | CUTANEOUS | Status: AC
  Administered 2013-02-17: 18:00:00 via TOPICAL

## 2013-02-17 NOTE — Progress Notes (Signed)
Patient here for post sim education.  She was given the Radiation Therapy and You book and the skin care handout.  Discussed potential side effects of treatment including pain, tenderness and swelling of the breast, skin changes and fatigue.  She was given Alra Deoderant and Radiaplex gel.  She was instructed to use the Radiaplex twice a day at least 4 hours before treatment and at bedtime.  She was oriented to the clinic and educated about under treat day with Dr. Roselind Messier.  She was advised to contact nursing with any questions or concerns.

## 2013-02-20 ENCOUNTER — Ambulatory Visit
Admission: RE | Admit: 2013-02-20 | Discharge: 2013-02-20 | Disposition: A | Discharge status: Home / Self Care | Payer: BC Managed Care – PPO | Source: Ambulatory Visit | Attending: Radiation Oncology | Admitting: Radiation Oncology

## 2013-02-20 ENCOUNTER — Ambulatory Visit: Payer: BC Managed Care – PPO

## 2013-02-21 ENCOUNTER — Encounter: Payer: Self-pay | Admitting: Radiation Oncology

## 2013-02-21 ENCOUNTER — Encounter (INDEPENDENT_AMBULATORY_CARE_PROVIDER_SITE_OTHER): Payer: Self-pay

## 2013-02-21 ENCOUNTER — Ambulatory Visit
Admission: RE | Admit: 2013-02-21 | Discharge: 2013-02-21 | Disposition: A | Discharge status: Home / Self Care | Payer: BC Managed Care – PPO | Source: Ambulatory Visit | Attending: Radiation Oncology | Admitting: Radiation Oncology

## 2013-02-21 ENCOUNTER — Ambulatory Visit: Payer: BC Managed Care – PPO

## 2013-02-21 VITALS — BP 107/62 | HR 88 | Temp 98.1°F | Ht 65.5 in | Wt 137.0 lb

## 2013-02-21 DIAGNOSIS — C50912 Malignant neoplasm of unspecified site of left female breast: Secondary | ICD-10-CM

## 2013-02-21 NOTE — Progress Notes (Signed)
Doris Bender has received 2 fractions to her left breast.  She reports that she was very nauseated after treatment on yesterday and had to stop her car and get out, but she did not have any vomiting.  She states she does have some anxiety, and she broke out in hives last Thursday, therefore, she will ask her PCP to restart her Xanax.  She reports that she has just completed PT on her back

## 2013-02-21 NOTE — Progress Notes (Signed)
[facility]    Radiation Oncology [address] [place]     Maryln Gottron, M.D. [place], [place] [address]               Billie Lade, M.D., Ph.D. Phone: [phone]      Molli Hazard A. Kathrynn Running, M.D. Fax: [phone]      Radene Gunning, M.D., Ph.D.         Lurline Hare, M.D.         Grayland Jack, M.D Weekly Treatment Management Note  Name: ELYA Bender     MRN: [id number]        CSN: [id number] Date: 02/21/2013      DOB: 1960-08-05  CC: [name], MD         Dimas Aguas     Status: Outpatient  Diagnosis: The encounter diagnosis was Breast cancer, left breast.  Current Dose: 3.6 Gy  Current Fraction: 2  Planned Dose: 62.4 Gy  Narrative: [name] was seen today for weekly treatment management. The chart was checked and port films  were reviewed. She is quite anxious with her treatment thus far. She did have nausea after her first treatment yesterday and Had some emesis on the way home. I have asked the  patient to let you know if this continues and we'll give her medicines for this issue. She did not eat anything prior to her treatments and this could be part of the issue.  Ibuprofen; Keflex; Ketorolac; Lodine; Other; Oxycodone; and Robaxin Current Outpatient Prescriptions  Medication Sig Dispense Refill  . ALPRAZolam (XANAX) 0.25 MG tablet Take 0.25 mg by mouth at bedtime as needed for sleep.      . hyaluronate sodium (RADIAPLEXRX) GEL Apply topically once.      . medroxyPROGESTERone (DEPO-PROVERA) 400 MG/ML SUSP Inject 400 mg into the muscle every 3 (three) months.       . non-metallic deodorant Thornton Papas) MISC Apply 1 application topically daily as needed.       No current facility-administered medications for this encounter.   Labs:  Lab Results  Component Value Date   WBC 6.4 01/05/2013   HGB 14.3 01/05/2013   HCT 42.3 01/05/2013   MCV 91.0 01/05/2013   PLT 203 01/05/2013   Lab Results  Component Value Date   CREATININE 0.9 01/05/2013   BUN 8.9  01/05/2013   NA 144 01/05/2013   K 4.6 01/05/2013   CO2 27 01/05/2013   Lab Results  Component Value Date   ALT 13 01/05/2013   AST 13 01/05/2013   BILITOT 0.55 01/05/2013    Physical Examination:  height is 5' 5.5" (1.664 m) and weight is 137 lb (62.143 kg). Her temperature is 98.1 F (36.7 C). Her blood pressure is 107/62 and her pulse is 88.    Wt Readings from Last 3 Encounters:  02/21/13 137 lb (62.143 kg)  01/06/13 134 lb (60.782 kg)  01/05/13 135 lb 1.6 oz (61.281 kg)    No significant radiation reaction in the skin of the left breast Lungs - Normal respiratory effort, chest expands symmetrically. Lungs are clear to auscultation, no crackles or wheezes.  Heart has regular rhythm and rate  Abdomen is soft and non tender with normal bowel sounds  Assessment:  Patient tolerating treatments well  Plan: Continue treatment per original radiation prescription

## 2013-02-22 ENCOUNTER — Ambulatory Visit: Payer: BC Managed Care – PPO

## 2013-02-22 ENCOUNTER — Ambulatory Visit
Admission: RE | Admit: 2013-02-22 | Discharge: 2013-02-22 | Disposition: A | Discharge status: Home / Self Care | Payer: BC Managed Care – PPO | Source: Ambulatory Visit | Attending: Radiation Oncology | Admitting: Radiation Oncology

## 2013-02-22 ENCOUNTER — Telehealth: Payer: Self-pay | Admitting: Oncology

## 2013-02-22 DIAGNOSIS — C50912 Malignant neoplasm of unspecified site of left female breast: Secondary | ICD-10-CM

## 2013-02-22 MED ORDER — PROCHLORPERAZINE MALEATE 10 MG PO TABS: 10.0000 mg | ORAL_TABLET | Freq: Four times a day (QID) | ORAL | Status: DC | PRN

## 2013-02-22 NOTE — Telephone Encounter (Signed)
Pt called today to r/s 11/10 appt to 12/11 @ 11:30am. Per pt appt needs to be pushed back due to late xrt start. Per pt last xrt tx will not be until 12/8 and f/u w/KK needs to be after last xrt. Pt given new appt for 12/11 @ 11:30am.

## 2013-02-23 ENCOUNTER — Ambulatory Visit
Admission: RE | Admit: 2013-02-23 | Discharge: 2013-02-23 | Disposition: A | Discharge status: Home / Self Care | Payer: BC Managed Care – PPO | Source: Ambulatory Visit | Attending: Radiation Oncology | Admitting: Radiation Oncology

## 2013-02-23 ENCOUNTER — Ambulatory Visit: Payer: BC Managed Care – PPO

## 2013-02-24 ENCOUNTER — Ambulatory Visit
Admission: RE | Admit: 2013-02-24 | Discharge: 2013-02-24 | Disposition: A | Discharge status: Home / Self Care | Payer: BC Managed Care – PPO | Source: Ambulatory Visit | Attending: Radiation Oncology | Admitting: Radiation Oncology

## 2013-02-24 ENCOUNTER — Ambulatory Visit: Payer: BC Managed Care – PPO

## 2013-02-27 ENCOUNTER — Encounter: Payer: Self-pay | Admitting: *Deleted

## 2013-02-27 ENCOUNTER — Ambulatory Visit: Payer: BC Managed Care – PPO

## 2013-02-27 ENCOUNTER — Ambulatory Visit
Admission: RE | Admit: 2013-02-27 | Discharge: 2013-02-27 | Disposition: A | Discharge status: Home / Self Care | Payer: BC Managed Care – PPO | Source: Ambulatory Visit | Attending: Radiation Oncology | Admitting: Radiation Oncology

## 2013-02-27 NOTE — Progress Notes (Signed)
Mailed after appt letter to pt. 

## 2013-02-28 ENCOUNTER — Ambulatory Visit
Admission: RE | Admit: 2013-02-28 | Discharge: 2013-02-28 | Disposition: A | Discharge status: Home / Self Care | Payer: BC Managed Care – PPO | Source: Ambulatory Visit | Attending: Radiation Oncology | Admitting: Radiation Oncology

## 2013-02-28 ENCOUNTER — Ambulatory Visit: Payer: BC Managed Care – PPO

## 2013-02-28 ENCOUNTER — Encounter: Payer: Self-pay | Admitting: Radiation Oncology

## 2013-02-28 VITALS — BP 117/72 | HR 79 | Resp 16 | Wt 137.0 lb

## 2013-02-28 DIAGNOSIS — C50912 Malignant neoplasm of unspecified site of left female breast: Secondary | ICD-10-CM

## 2013-02-28 NOTE — Progress Notes (Signed)
Reports fatigue related to taper off prednisone for arthritis. Denies skin changes to left/treated breast. Reports using radiaplex and alra as directed.

## 2013-02-28 NOTE — Progress Notes (Signed)
  Radiation Oncology         [phone] ________________________________  Name: Doris Bender MRN: [id number]  Date: 02/28/2013  DOB: 08-Mar-1961  Weekly Radiation Therapy Management  Current Dose: 12.6 Gy     Planned Dose:  62.4 Gy  Narrative . . . . . . . . The patient presents for routine under treatment assessment.                                   The patient is without complaint except for fatigue related to steroid taper. Her foot is feeling better.                                 Set-up films were reviewed.                                 The chart was checked. Physical Findings. . .  weight is 137 lb (62.143 kg). Her blood pressure is 117/72 and her pulse is 79. Her respiration is 16. . Weight essentially stable.  No significant changes. Impression . . . . . . . The patient is tolerating radiation. Plan . . . . . . . . . . . . Continue treatment as planned.  ________________________________  -----------------------------------  Billie Lade, PhD, MD

## 2013-03-01 ENCOUNTER — Ambulatory Visit: Payer: BC Managed Care – PPO

## 2013-03-01 ENCOUNTER — Ambulatory Visit
Admission: RE | Admit: 2013-03-01 | Discharge: 2013-03-01 | Disposition: A | Discharge status: Home / Self Care | Payer: BC Managed Care – PPO | Source: Ambulatory Visit | Attending: Radiation Oncology | Admitting: Radiation Oncology

## 2013-03-01 DIAGNOSIS — C50912 Malignant neoplasm of unspecified site of left female breast: Secondary | ICD-10-CM

## 2013-03-01 MED ORDER — RADIAPLEXRX EX GEL
Freq: Once | CUTANEOUS | Status: AC
  Administered 2013-03-01: 18:00:00 via TOPICAL

## 2013-03-02 ENCOUNTER — Ambulatory Visit
Admission: RE | Admit: 2013-03-02 | Discharge: 2013-03-02 | Disposition: A | Discharge status: Home / Self Care | Payer: BC Managed Care – PPO | Source: Ambulatory Visit | Attending: Radiation Oncology | Admitting: Radiation Oncology

## 2013-03-02 ENCOUNTER — Ambulatory Visit: Payer: BC Managed Care – PPO

## 2013-03-03 ENCOUNTER — Ambulatory Visit
Admission: RE | Admit: 2013-03-03 | Discharge: 2013-03-03 | Disposition: A | Discharge status: Home / Self Care | Payer: BC Managed Care – PPO | Source: Ambulatory Visit | Attending: Radiation Oncology | Admitting: Radiation Oncology

## 2013-03-03 ENCOUNTER — Ambulatory Visit: Payer: BC Managed Care – PPO

## 2013-03-06 ENCOUNTER — Ambulatory Visit
Admission: RE | Admit: 2013-03-06 | Discharge: 2013-03-06 | Disposition: A | Discharge status: Home / Self Care | Payer: BC Managed Care – PPO | Source: Ambulatory Visit | Attending: Radiation Oncology | Admitting: Radiation Oncology

## 2013-03-06 ENCOUNTER — Ambulatory Visit: Payer: BC Managed Care – PPO

## 2013-03-07 ENCOUNTER — Ambulatory Visit: Payer: BC Managed Care – PPO

## 2013-03-07 ENCOUNTER — Ambulatory Visit
Admission: RE | Admit: 2013-03-07 | Discharge: 2013-03-07 | Disposition: A | Discharge status: Home / Self Care | Payer: BC Managed Care – PPO | Source: Ambulatory Visit | Attending: Radiation Oncology | Admitting: Radiation Oncology

## 2013-03-07 VITALS — BP 110/74 | HR 76 | Temp 98.7°F | Resp 20 | Wt 138.7 lb

## 2013-03-07 DIAGNOSIS — C50912 Malignant neoplasm of unspecified site of left female breast: Secondary | ICD-10-CM

## 2013-03-07 NOTE — Progress Notes (Addendum)
Pt denies pain, loss of appetite. She states she felt nauseated last Thurs and had to leave work, went home and slept. She states she still felt nauseated Fri morning, so she stayed home and rested. She states she has begun drinking 2 Boosts daily which has helped her energy level. She is applying Radiaplex to left breast treatment area., states she has very slight pigment changes.

## 2013-03-07 NOTE — Progress Notes (Signed)
[facility]    Radiation Oncology [address] [place]     Maryln Gottron, M.D. [place], [place] [address]               Billie Lade, M.D., Ph.D. Phone: [phone]      Molli Hazard A. Kathrynn Running, M.D. Fax: [phone]      Radene Gunning, M.D., Ph.D.         Lurline Hare, M.D.         Grayland Jack, M.D Weekly Treatment Management Note  Name: Doris Bender     MRN: [id number]        CSN: [id number] Date: 03/07/2013      DOB: 08-Oct-1960  CC: [name], MD         Dimas Aguas    Status: Outpatient  Diagnosis: The encounter diagnosis was Breast cancer, left breast.  Current Dose: 21.6 Gy  Current Fraction: 12  Planned Dose: 62.4 gy  Narrative: [name] was seen today for weekly treatment management. The chart was checked and port films  were reviewed. She is tolerating the treatments well without any significant itching or discomfort in the breast. She does however have some fatigue but is able to continue working full-time. She did have a GI virus late last week with a lot of nausea and fatigue which lasted through the weekend. She is feeling much better at this time.  Ibuprofen; Keflex; Ketorolac; Lodine; Other; Oxycodone; and Robaxin  Current Outpatient Prescriptions  Medication Sig Dispense Refill  . ALPRAZolam (XANAX) 0.25 MG tablet Take 0.25 mg by mouth at bedtime as needed for sleep.      . hyaluronate sodium (RADIAPLEXRX) GEL Apply topically once.      . medroxyPROGESTERone (DEPO-PROVERA) 400 MG/ML SUSP Inject 400 mg into the muscle every 3 (three) months.       . non-metallic deodorant Thornton Papas) MISC Apply 1 application topically daily as needed.      . prochlorperazine (COMPAZINE) 10 MG tablet Take 1 tablet (10 mg total) by mouth every 6 (six) hours as needed (nausea).  30 tablet  0   No current facility-administered medications for this encounter.   Labs:  Lab Results  Component Value Date   WBC 6.4 01/05/2013   HGB 14.3 01/05/2013   HCT 42.3  01/05/2013   MCV 91.0 01/05/2013   PLT 203 01/05/2013   Lab Results  Component Value Date   CREATININE 0.9 01/05/2013   BUN 8.9 01/05/2013   NA 144 01/05/2013   K 4.6 01/05/2013   CO2 27 01/05/2013   Lab Results  Component Value Date   ALT 13 01/05/2013   AST 13 01/05/2013   BILITOT 0.55 01/05/2013    Physical Examination:  weight is 138 lb 11.2 oz (62.914 kg). Her temperature is 98.7 F (37.1 C). Her blood pressure is 110/74 and her pulse is 76. Her respiration is 20.    Wt Readings from Last 3 Encounters:  03/07/13 138 lb 11.2 oz (62.914 kg)  02/28/13 137 lb (62.143 kg)  02/21/13 137 lb (62.143 kg)    The left breast area shows some hyperpigmentation changes and radiation dermatitis in the upper aspect of the breast. No moist desquamation. Lungs - Normal respiratory effort, chest expands symmetrically. Lungs are clear to auscultation, no crackles or wheezes.  Heart has regular rhythm and rate  Abdomen is soft and non tender with normal bowel sounds  Assessment:  Patient tolerating treatments well  Plan: Continue treatment per original  radiation prescription

## 2013-03-08 ENCOUNTER — Ambulatory Visit
Admission: RE | Admit: 2013-03-08 | Discharge: 2013-03-08 | Disposition: A | Discharge status: Home / Self Care | Payer: BC Managed Care – PPO | Source: Ambulatory Visit | Attending: Radiation Oncology | Admitting: Radiation Oncology

## 2013-03-08 ENCOUNTER — Ambulatory Visit: Payer: BC Managed Care – PPO

## 2013-03-09 ENCOUNTER — Ambulatory Visit: Payer: BC Managed Care – PPO

## 2013-03-09 ENCOUNTER — Ambulatory Visit
Admission: RE | Admit: 2013-03-09 | Discharge: 2013-03-09 | Disposition: A | Discharge status: Home / Self Care | Payer: BC Managed Care – PPO | Source: Ambulatory Visit | Attending: Radiation Oncology | Admitting: Radiation Oncology

## 2013-03-10 ENCOUNTER — Ambulatory Visit
Admission: RE | Admit: 2013-03-10 | Discharge: 2013-03-10 | Disposition: A | Discharge status: Home / Self Care | Payer: BC Managed Care – PPO | Source: Ambulatory Visit | Attending: Radiation Oncology | Admitting: Radiation Oncology

## 2013-03-10 ENCOUNTER — Ambulatory Visit: Payer: BC Managed Care – PPO

## 2013-03-13 ENCOUNTER — Ambulatory Visit: Payer: BC Managed Care – PPO

## 2013-03-13 ENCOUNTER — Ambulatory Visit: Payer: BC Managed Care – PPO | Admitting: Oncology

## 2013-03-13 ENCOUNTER — Ambulatory Visit
Admission: RE | Admit: 2013-03-13 | Discharge: 2013-03-13 | Disposition: A | Discharge status: Home / Self Care | Payer: BC Managed Care – PPO | Source: Ambulatory Visit | Attending: Radiation Oncology | Admitting: Radiation Oncology

## 2013-03-14 ENCOUNTER — Encounter: Payer: Self-pay | Admitting: Radiation Oncology

## 2013-03-14 ENCOUNTER — Ambulatory Visit
Admission: RE | Admit: 2013-03-14 | Discharge: 2013-03-14 | Disposition: A | Discharge status: Home / Self Care | Payer: BC Managed Care – PPO | Source: Ambulatory Visit | Attending: Radiation Oncology | Admitting: Radiation Oncology

## 2013-03-14 ENCOUNTER — Ambulatory Visit: Payer: BC Managed Care – PPO

## 2013-03-14 VITALS — BP 121/70 | HR 82 | Resp 16 | Wt 139.6 lb

## 2013-03-14 DIAGNOSIS — C50912 Malignant neoplasm of unspecified site of left female breast: Secondary | ICD-10-CM

## 2013-03-14 NOTE — Progress Notes (Signed)
[facility]    Radiation Oncology [address] [place]     Maryln Gottron, M.D. [place], [place] [address]               Billie Lade, M.D., Ph.D. Phone: [phone]      Molli Hazard A. Kathrynn Running, M.D. Fax: [phone]      Radene Gunning, M.D., Ph.D.         Lurline Hare, M.D.         Grayland Jack, M.D Weekly Treatment Management Note  Name: Doris Bender     MRN: [id number]        CSN: [id number] Date: 03/14/2013      DOB: 12-03-1960  CC: Doris Flavin, MD         Dimas Aguas    Status: Outpatient  Diagnosis: The encounter diagnosis was Breast cancer, left breast.  Current Dose: 30.6 Gy  Current Fraction: 17  Planned Dose: 62.4 Gy  Narrative: Doris Bender was seen today for weekly treatment management. The chart was checked and port films  were reviewed. She is having some itching in the breast area and will use hydrocortisone if the itching keeps her awake at night.  Her energy level is good. She is taking in a nutritional supplement in the morning which has helped significantly for her.  Ibuprofen; Keflex; Ketorolac; Lodine; Other; Oxycodone; and Robaxin  Current Outpatient Prescriptions  Medication Sig Dispense Refill  . ALPRAZolam (XANAX) 0.25 MG tablet Take 0.25 mg by mouth at bedtime as needed for sleep.      . hyaluronate sodium (RADIAPLEXRX) GEL Apply topically once.      . medroxyPROGESTERone (DEPO-PROVERA) 400 MG/ML SUSP Inject 400 mg into the muscle every 3 (three) months.       . non-metallic deodorant Thornton Papas) MISC Apply 1 application topically daily as needed.      . prochlorperazine (COMPAZINE) 10 MG tablet Take 1 tablet (10 mg total) by mouth every 6 (six) hours as needed (nausea).  30 tablet  0   No current facility-administered medications for this encounter.     Physical Examination:  weight is 139 lb 9.6 oz (63.322 kg). Her blood pressure is 121/70 and her pulse is 82. Her respiration is 16.    Wt Readings from Last 3 Encounters:   03/14/13 139 lb 9.6 oz (63.322 kg)  03/07/13 138 lb 11.2 oz (62.914 kg)  02/28/13 137 lb (62.143 kg)    The left breast area shows some mild erythema and hyperpigmentation changes. There is some mild radiation dermatitis in the upper aspect of the breast. Lungs - Normal respiratory effort, chest expands symmetrically. Lungs are clear to auscultation, no crackles or wheezes.  Heart has regular rhythm and rate  Abdomen is soft and non tender with normal bowel sounds  Assessment:  Patient tolerating treatments well except for above issues  Plan: Continue treatment per original radiation prescription

## 2013-03-14 NOTE — Progress Notes (Signed)
Hyperpigmentation of treatment area noted without desquamation or breaks in the skin. Patient reports skin feels dry and itchy. Encouraged patient to continue to use radiaplex but, apply hydrocortisone to the areas that itch. Patient verbalized understanding. Denies fatigue.

## 2013-03-15 ENCOUNTER — Ambulatory Visit
Admission: RE | Admit: 2013-03-15 | Discharge: 2013-03-15 | Disposition: A | Discharge status: Home / Self Care | Payer: BC Managed Care – PPO | Source: Ambulatory Visit | Attending: Radiation Oncology | Admitting: Radiation Oncology

## 2013-03-15 ENCOUNTER — Ambulatory Visit: Payer: BC Managed Care – PPO

## 2013-03-16 ENCOUNTER — Ambulatory Visit: Payer: BC Managed Care – PPO

## 2013-03-16 ENCOUNTER — Ambulatory Visit
Admission: RE | Admit: 2013-03-16 | Discharge: 2013-03-16 | Disposition: A | Discharge status: Home / Self Care | Payer: BC Managed Care – PPO | Source: Ambulatory Visit | Attending: Radiation Oncology | Admitting: Radiation Oncology

## 2013-03-17 ENCOUNTER — Ambulatory Visit
Admission: RE | Admit: 2013-03-17 | Discharge: 2013-03-17 | Disposition: A | Discharge status: Home / Self Care | Payer: BC Managed Care – PPO | Source: Ambulatory Visit | Attending: Radiation Oncology | Admitting: Radiation Oncology

## 2013-03-17 ENCOUNTER — Ambulatory Visit: Payer: BC Managed Care – PPO

## 2013-03-20 ENCOUNTER — Ambulatory Visit
Admission: RE | Admit: 2013-03-20 | Discharge: 2013-03-20 | Disposition: A | Discharge status: Home / Self Care | Payer: BC Managed Care – PPO | Source: Ambulatory Visit | Attending: Radiation Oncology | Admitting: Radiation Oncology

## 2013-03-20 ENCOUNTER — Ambulatory Visit: Payer: BC Managed Care – PPO

## 2013-03-21 ENCOUNTER — Ambulatory Visit: Payer: BC Managed Care – PPO

## 2013-03-21 ENCOUNTER — Ambulatory Visit
Admission: RE | Admit: 2013-03-21 | Discharge: 2013-03-21 | Disposition: A | Discharge status: Home / Self Care | Payer: BC Managed Care – PPO | Source: Ambulatory Visit | Attending: Radiation Oncology | Admitting: Radiation Oncology

## 2013-03-21 ENCOUNTER — Encounter: Payer: Self-pay | Admitting: Radiation Oncology

## 2013-03-21 VITALS — BP 103/62 | HR 85 | Temp 99.0°F | Resp 20 | Wt 140.2 lb

## 2013-03-21 DIAGNOSIS — C50912 Malignant neoplasm of unspecified site of left female breast: Secondary | ICD-10-CM

## 2013-03-21 NOTE — Progress Notes (Signed)
Pt denies pain, loss of appetite. She is fatigued but is drinking 2 cans of Boost daily which she states helps her fatigue. She is applying Radiaplex to left breast treatment area for hyperpigmentation. She denies itching in her treatment area.

## 2013-03-21 NOTE — Progress Notes (Signed)
[facility]    Radiation Oncology [address] [place]     Maryln Gottron, M.D. [place], [place] [address]               Billie Lade, M.D., Ph.D. Phone: [phone]      Molli Hazard A. Kathrynn Running, M.D. Fax: [phone]      Radene Gunning, M.D., Ph.D.         Lurline Hare, M.D.         Grayland Jack, M.D Weekly Treatment Management Note  Name: Doris Bender     MRN: [id number]        CSN: [id number] Date: 03/21/2013      DOB: 03/01/1961  CC: [name], MD         Dimas Aguas    Status: Outpatient  Diagnosis: The encounter diagnosis was Breast cancer, left breast.  Current Dose: 39.6 Gy  Current Fraction: 22  Planned Dose: 62.4 Gy  Narrative: Doris Bender was seen today for weekly treatment management. The chart was checked and port films  were reviewed. She is tolerating the treatments well this week. She has had no further nausea issues. She denies any significant itching or discomfort in the breast. She has been taking nutritional supplements which has been helping with her fatigue.  Ibuprofen; Keflex; Ketorolac; Lodine; Other; Oxycodone; and Robaxin Current Outpatient Prescriptions  Medication Sig Dispense Refill  . ALPRAZolam (XANAX) 0.25 MG tablet Take 0.25 mg by mouth at bedtime as needed for sleep.      . hyaluronate sodium (RADIAPLEXRX) GEL Apply topically once.      . medroxyPROGESTERone (DEPO-PROVERA) 400 MG/ML SUSP Inject 400 mg into the muscle every 3 (three) months.       . non-metallic deodorant Thornton Papas) MISC Apply 1 application topically daily as needed.      . prochlorperazine (COMPAZINE) 10 MG tablet Take 1 tablet (10 mg total) by mouth every 6 (six) hours as needed (nausea).  30 tablet  0   No current facility-administered medications for this encounter.     Physical Examination:  Filed Vitals:   03/21/13 1835  BP: 103/62  Pulse: 85  Temp: 99 F (37.2 C)  Resp: 20    Wt Readings from Last 3 Encounters:  03/21/13 140 lb 3.2  oz (63.594 kg)  03/14/13 139 lb 9.6 oz (63.322 kg)  03/07/13 138 lb 11.2 oz (62.914 kg)    The left breast area shows some mild erythema and hyperpigmentation changes. Lungs - Normal respiratory effort, chest expands symmetrically. Lungs are clear to auscultation, no crackles or wheezes.  Heart has regular rhythm and rate  Abdomen is soft and non tender with normal bowel sounds  Assessment:  Patient tolerating treatments well  Plan: Continue treatment per original radiation prescription

## 2013-03-22 ENCOUNTER — Ambulatory Visit
Admission: RE | Admit: 2013-03-22 | Discharge: 2013-03-22 | Disposition: A | Discharge status: Home / Self Care | Payer: BC Managed Care – PPO | Source: Ambulatory Visit | Attending: Radiation Oncology | Admitting: Radiation Oncology

## 2013-03-22 ENCOUNTER — Ambulatory Visit: Payer: BC Managed Care – PPO

## 2013-03-23 ENCOUNTER — Ambulatory Visit
Admission: RE | Admit: 2013-03-23 | Discharge: 2013-03-23 | Disposition: A | Discharge status: Home / Self Care | Payer: BC Managed Care – PPO | Source: Ambulatory Visit | Attending: Radiation Oncology | Admitting: Radiation Oncology

## 2013-03-23 ENCOUNTER — Ambulatory Visit: Payer: BC Managed Care – PPO

## 2013-03-24 ENCOUNTER — Ambulatory Visit
Admission: RE | Admit: 2013-03-24 | Discharge: 2013-03-24 | Disposition: A | Discharge status: Home / Self Care | Payer: BC Managed Care – PPO | Source: Ambulatory Visit | Attending: Radiation Oncology | Admitting: Radiation Oncology

## 2013-03-24 ENCOUNTER — Ambulatory Visit: Payer: BC Managed Care – PPO

## 2013-03-26 ENCOUNTER — Ambulatory Visit
Admission: RE | Admit: 2013-03-26 | Discharge: 2013-03-26 | Disposition: A | Discharge status: Home / Self Care | Payer: BC Managed Care – PPO | Source: Ambulatory Visit | Attending: Radiation Oncology | Admitting: Radiation Oncology

## 2013-03-27 ENCOUNTER — Ambulatory Visit: Payer: BC Managed Care – PPO

## 2013-03-27 ENCOUNTER — Ambulatory Visit
Admission: RE | Admit: 2013-03-27 | Discharge: 2013-03-27 | Disposition: A | Discharge status: Home / Self Care | Payer: BC Managed Care – PPO | Source: Ambulatory Visit | Attending: Radiation Oncology | Admitting: Radiation Oncology

## 2013-03-28 ENCOUNTER — Encounter: Payer: Self-pay | Admitting: Radiation Oncology

## 2013-03-28 ENCOUNTER — Ambulatory Visit
Admission: RE | Admit: 2013-03-28 | Discharge: 2013-03-28 | Disposition: A | Discharge status: Home / Self Care | Payer: BC Managed Care – PPO | Source: Ambulatory Visit | Attending: Radiation Oncology | Admitting: Radiation Oncology

## 2013-03-28 ENCOUNTER — Ambulatory Visit: Payer: BC Managed Care – PPO

## 2013-03-28 VITALS — BP 122/75 | HR 87 | Temp 99.2°F | Resp 20 | Wt 139.7 lb

## 2013-03-28 DIAGNOSIS — C50912 Malignant neoplasm of unspecified site of left female breast: Secondary | ICD-10-CM

## 2013-03-28 MED ORDER — RADIAPLEXRX EX GEL
Freq: Once | CUTANEOUS | Status: AC
  Administered 2013-03-28: 19:00:00 via TOPICAL

## 2013-03-28 NOTE — Addendum Note (Signed)
Encounter addended by: Glennie Hawk, RN on: 03/28/2013  7:19 PM<BR>     Documentation filed: Inpatient MAR, Orders

## 2013-03-28 NOTE — Progress Notes (Signed)
   Department of Radiation Oncology  Phone:  [phone] Fax:        [phone]  Electron beam simulation note - performed on November 24  Earlier today the patient underwent additional planning for radiation therapy directed at the left breast. The patient's treatment planning CT scan was reviewed and she had set up of a custom electron cutout field directed at the lumpectomy cavity. Patient will be treated with 9 MeV electrons prescribed to the 90% isodose line. The patient will received 6 additional treatments at 2 gray per fraction for a boost dose of 12 gray. A special port plan is requested for treatment.  -----------------------------------  Billie Lade, PhD, MD

## 2013-03-28 NOTE — Progress Notes (Signed)
Pt states she has " a little pain inside her left breast". She is fatigued, no loss of appetite. She is applying Radiaplex to left breast tx area for hyperpigmentation.

## 2013-03-28 NOTE — Progress Notes (Signed)
[facility]    Radiation Oncology [address] [place]     Maryln Gottron, M.D. [place], [place] [address]               Billie Lade, M.D., Ph.D. Phone: [phone]      Molli Hazard A. Kathrynn Running, M.D. Fax: [phone]      Radene Gunning, M.D., Ph.D.         Lurline Hare, M.D.         Grayland Jack, M.D Weekly Treatment Management Note  Name: Doris Bender     MRN: [id number]        CSN: [id number] Date: 03/28/2013      DOB: 1961-04-27  CC: [name], MD         Dimas Aguas    Status: Outpatient  Diagnosis: The encounter diagnosis was Breast cancer, left breast.  Current Dose: 50.4 Gy  Current Fraction: 28  Planned Dose: 62.4 Gy  Narrative: [name] was seen today for weekly treatment management. The chart was checked and port films  were reviewed. She has noticed some pain deep within the breast which is intermittent. She has minimal fatigue at this time.  Ibuprofen; Keflex; Ketorolac; Lodine; Other; Oxycodone; and Robaxin Current Outpatient Prescriptions  Medication Sig Dispense Refill  . ALPRAZolam (XANAX) 0.25 MG tablet Take 0.25 mg by mouth at bedtime as needed for sleep.      . hyaluronate sodium (RADIAPLEXRX) GEL Apply topically once.      . medroxyPROGESTERone (DEPO-PROVERA) 400 MG/ML SUSP Inject 400 mg into the muscle every 3 (three) months.       . non-metallic deodorant Thornton Papas) MISC Apply 1 application topically daily as needed.       No current facility-administered medications for this encounter.   Labs:  Lab Results  Component Value Date   WBC 6.4 01/05/2013   HGB 14.3 01/05/2013   HCT 42.3 01/05/2013   MCV 91.0 01/05/2013   PLT 203 01/05/2013   Lab Results  Component Value Date   CREATININE 0.9 01/05/2013   BUN 8.9 01/05/2013   NA 144 01/05/2013   K 4.6 01/05/2013   CO2 27 01/05/2013   Lab Results  Component Value Date   ALT 13 01/05/2013   AST 13 01/05/2013   BILITOT 0.55 01/05/2013    Physical Examination:  Filed Vitals:   03/28/13  1824  BP: 122/75  Pulse: 87  Temp: 99.2 F (37.3 C)  Resp: 20    Wt Readings from Last 3 Encounters:  03/28/13 139 lb 11.2 oz (63.368 kg)  03/21/13 140 lb 3.2 oz (63.594 kg)  03/14/13 139 lb 9.6 oz (63.322 kg)   The left breast area is hyperpigmented and erythematous but no moist desquamation.  Lungs - Normal respiratory effort, chest expands symmetrically. Lungs are clear to auscultation, no crackles or wheezes.  Heart has regular rhythm and rate  Abdomen is soft and non tender with normal bowel sounds  Assessment:  Patient tolerating treatments well  Plan: Continue treatment per original radiation prescription

## 2013-03-29 ENCOUNTER — Ambulatory Visit
Admission: RE | Admit: 2013-03-29 | Discharge: 2013-03-29 | Disposition: A | Discharge status: Home / Self Care | Payer: BC Managed Care – PPO | Source: Ambulatory Visit | Attending: Radiation Oncology | Admitting: Radiation Oncology

## 2013-03-29 ENCOUNTER — Ambulatory Visit: Payer: BC Managed Care – PPO

## 2013-04-03 ENCOUNTER — Ambulatory Visit
Admission: RE | Admit: 2013-04-03 | Discharge: 2013-04-03 | Disposition: A | Discharge status: Home / Self Care | Payer: BC Managed Care – PPO | Source: Ambulatory Visit | Attending: Radiation Oncology | Admitting: Radiation Oncology

## 2013-04-03 ENCOUNTER — Ambulatory Visit: Payer: BC Managed Care – PPO

## 2013-04-04 ENCOUNTER — Ambulatory Visit: Payer: BC Managed Care – PPO

## 2013-04-04 ENCOUNTER — Ambulatory Visit
Admission: RE | Admit: 2013-04-04 | Discharge: 2013-04-04 | Disposition: A | Discharge status: Home / Self Care | Payer: BC Managed Care – PPO | Source: Ambulatory Visit | Attending: Radiation Oncology | Admitting: Radiation Oncology

## 2013-04-05 ENCOUNTER — Encounter: Payer: Self-pay | Admitting: Radiation Oncology

## 2013-04-05 ENCOUNTER — Ambulatory Visit
Admission: RE | Admit: 2013-04-05 | Discharge: 2013-04-05 | Disposition: A | Discharge status: Home / Self Care | Payer: BC Managed Care – PPO | Source: Ambulatory Visit | Attending: Radiation Oncology | Admitting: Radiation Oncology

## 2013-04-05 ENCOUNTER — Ambulatory Visit: Payer: BC Managed Care – PPO

## 2013-04-05 VITALS — BP 115/74 | HR 87 | Temp 99.6°F | Resp 20 | Wt 139.0 lb

## 2013-04-05 DIAGNOSIS — C50912 Malignant neoplasm of unspecified site of left female breast: Secondary | ICD-10-CM

## 2013-04-05 NOTE — Progress Notes (Signed)
Pt states she has had slight sore throat x 2 days, no other c/o. She took Dayquill for this.   She is fatigued, no loss of appetite. She is applying Radiaplex to left breast for hyperpigmentation.

## 2013-04-05 NOTE — Progress Notes (Signed)
[facility]    Radiation Oncology [address] [place]     Maryln Gottron, M.D. [place], [place] [address]               Billie Lade, M.D., Ph.D. Phone: [phone]      Molli Hazard A. Kathrynn Running, M.D. Fax: [phone]      Radene Gunning, M.D., Ph.D.         Lurline Hare, M.D.         Grayland Jack, M.D Weekly Treatment Management Note  Name: Doris Bender     MRN: [id number]        CSN: [id number] Date: 04/05/2013      DOB: 1961-02-28  CC: [name], MD         Dimas Aguas    Status: Outpatient  Diagnosis: The encounter diagnosis was Breast cancer, left breast.  Current Dose: 56.4 Gy  Current Fraction: 31  Planned Dose: 62.4 Gy  Narrative: Doris Bender was seen today for weekly treatment management. The chart was checked and port films  were reviewed. She continues to tolerate her treatments well at this time. She does have some fatigue but is able to carry on her usual work schedule.  Ibuprofen; Keflex; Ketorolac; Lodine; Other; Oxycodone; and Robaxin Current Outpatient Prescriptions  Medication Sig Dispense Refill  . ALPRAZolam (XANAX) 0.25 MG tablet Take 0.25 mg by mouth at bedtime as needed for sleep.      . hyaluronate sodium (RADIAPLEXRX) GEL Apply topically once.      . medroxyPROGESTERone (DEPO-PROVERA) 400 MG/ML SUSP Inject 400 mg into the muscle every 3 (three) months.       . non-metallic deodorant Thornton Papas) MISC Apply 1 application topically daily as needed.       No current facility-administered medications for this encounter.   Labs:  Lab Results  Component Value Date   WBC 6.4 01/05/2013   HGB 14.3 01/05/2013   HCT 42.3 01/05/2013   MCV 91.0 01/05/2013   PLT 203 01/05/2013   Lab Results  Component Value Date   CREATININE 0.9 01/05/2013   BUN 8.9 01/05/2013   NA 144 01/05/2013   K 4.6 01/05/2013   CO2 27 01/05/2013   Lab Results  Component Value Date   ALT 13 01/05/2013   AST 13 01/05/2013   BILITOT 0.55 01/05/2013    Physical Examination:   Filed Vitals:   04/05/13 1752  BP: 115/74  Pulse: 87  Temp: 99.6 F (37.6 C)  Resp: 20    Wt Readings from Last 3 Encounters:  04/05/13 139 lb (63.05 kg)  03/28/13 139 lb 11.2 oz (63.368 kg)  03/21/13 140 lb 3.2 oz (63.594 kg)    The left breast area shows some hyperpigmentation changes but no skin breakdown. Lungs - Normal respiratory effort, chest expands symmetrically. Lungs are clear to auscultation, no crackles or wheezes.  Heart has regular rhythm and rate  Abdomen is soft and non tender with normal bowel sounds  Assessment:  Patient tolerating treatments well  Plan: Continue treatment per original radiation prescription

## 2013-04-06 ENCOUNTER — Ambulatory Visit
Admission: RE | Admit: 2013-04-06 | Discharge: 2013-04-06 | Disposition: A | Discharge status: Home / Self Care | Payer: BC Managed Care – PPO | Source: Ambulatory Visit | Attending: Radiation Oncology | Admitting: Radiation Oncology

## 2013-04-06 ENCOUNTER — Ambulatory Visit: Payer: BC Managed Care – PPO

## 2013-04-07 ENCOUNTER — Ambulatory Visit
Admission: RE | Admit: 2013-04-07 | Discharge: 2013-04-07 | Disposition: A | Discharge status: Home / Self Care | Payer: BC Managed Care – PPO | Source: Ambulatory Visit | Attending: Radiation Oncology | Admitting: Radiation Oncology

## 2013-04-07 ENCOUNTER — Ambulatory Visit: Payer: BC Managed Care – PPO

## 2013-04-10 ENCOUNTER — Ambulatory Visit: Payer: BC Managed Care – PPO

## 2013-04-13 ENCOUNTER — Telehealth: Payer: Self-pay | Admitting: Oncology

## 2013-04-13 ENCOUNTER — Ambulatory Visit (HOSPITAL_BASED_OUTPATIENT_CLINIC_OR_DEPARTMENT_OTHER): Payer: BC Managed Care – PPO

## 2013-04-13 ENCOUNTER — Ambulatory Visit (HOSPITAL_BASED_OUTPATIENT_CLINIC_OR_DEPARTMENT_OTHER): Payer: BC Managed Care – PPO | Admitting: Oncology

## 2013-04-13 VITALS — BP 112/66 | HR 104 | Temp 99.5°F | Resp 19 | Ht 65.5 in | Wt 139.1 lb

## 2013-04-13 DIAGNOSIS — C50919 Malignant neoplasm of unspecified site of unspecified female breast: Secondary | ICD-10-CM

## 2013-04-13 DIAGNOSIS — C50912 Malignant neoplasm of unspecified site of left female breast: Secondary | ICD-10-CM

## 2013-04-13 DIAGNOSIS — C50419 Malignant neoplasm of upper-outer quadrant of unspecified female breast: Secondary | ICD-10-CM

## 2013-04-13 LAB — CBC WITH DIFFERENTIAL/PLATELET
Basophils Absolute: 0 10*3/uL (ref 0.0–0.1)
Eosinophils Absolute: 0.2 10*3/uL (ref 0.0–0.5)
HGB: 14 g/dL (ref 11.6–15.9)
LYMPH%: 17.9 % (ref 14.0–49.7)
MCV: 91.3 fL (ref 79.5–101.0)
MONO#: 0.4 10*3/uL (ref 0.1–0.9)
MONO%: 7.3 % (ref 0.0–14.0)
NEUT#: 4 10*3/uL (ref 1.5–6.5)
Platelets: 188 10*3/uL (ref 145–400)
RBC: 4.44 10*6/uL (ref 3.70–5.45)
RDW: 13.2 % (ref 11.2–14.5)
WBC: 5.6 10*3/uL (ref 3.9–10.3)

## 2013-04-13 LAB — FOLLICLE STIMULATING HORMONE: FSH: 87.9 m[IU]/mL

## 2013-04-13 LAB — COMPREHENSIVE METABOLIC PANEL (CC13)
Albumin: 4.2 g/dL (ref 3.5–5.0)
Anion Gap: 9 mEq/L (ref 3–11)
BUN: 13.7 mg/dL (ref 7.0–26.0)
CO2: 28 mEq/L (ref 22–29)
Glucose: 96 mg/dl (ref 70–140)
Potassium: 4.4 mEq/L (ref 3.5–5.1)
Sodium: 141 mEq/L (ref 136–145)
Total Protein: 7.1 g/dL (ref 6.4–8.3)

## 2013-04-13 MED ORDER — TAMOXIFEN CITRATE 20 MG PO TABS: 20.0000 mg | ORAL_TABLET | Freq: Every day | ORAL | Status: DC

## 2013-04-13 NOTE — Progress Notes (Signed)
Doris Bender [id number] 1960/10/17 52 y.o. 04/13/2013 12:35 PM  CC  Doris Flavin, MD [address] [place] [address] Dr. Cyndia Bent Dr. Antony Blackbird  REASON FOR CONSULTATION:  52 year old female with new diagnosis of stage I left breast cancer. Patient is seen in medical oncology for discussion of treatment options.  STAGE:  Left breast T1 N0 ER positive, PR negative, Ki-6712%  REFERRING PHYSICIAN: Dr. Cyndia Bent  Prior oncology history:  Doris Bender is a 52 y.o. female.    #1. Earlier this year had a routine screening mammogram performed that showed a suspicious area in the upper outer aspect of the left breast. 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 Subsequent MRI confirmed a solitary lesion in the upper outer aspect of the left breast. Patient went on to have a partial mastectomy and sentinel lymph node biopsy on 12/21/2012. The final pathology revealed a 0.7 cm tumor all margins were negative. Tumor was low grade estrogen receptor 90% progesterone receptor negative HER-2/neu negative with a Ki-67 12%.   #2. S/p radiation therapy administered 02/21/13 - 04/07/13  #3 begin tamoxifen 20 mg daily for 10 years  Current Therapy: curative intentTamoxifen 20 mg daily   Interval History:   Past Medical History: Past Medical History  Diagnosis Date  . No pertinent past medical history   . Contact lens/glasses fitting     wears contacts or glasses  . Wears partial dentures     partial lower  . Cancer     breast  . Compression fracture of L4 lumbar vertebra 11/04/2012    injury  . Raynaud's disease     bilateral hands and feet  . Arthritis     lt great toe (RA)  . Osteoporosis   . Pseudogout of joint of left foot 6 yrs ago  . Gout 8-9 yrs ago    Rt heel/foot  . Breast cancer 11/15/12    left    Past Surgical History: Past Surgical History  Procedure Laterality Date  . Wrist ganglion excision      left x2  .  Tonsillectomy    . Knee arthroscopy  2006    right  . Cervical conization w/bx  2012    benign  . Lateral epicondyle release  03/08/2012    Procedure: TENNIS ELBOW RELEASE;  Surgeon: Wyn Forster., MD;  Location: [facility];  Service: Orthopedics;  Laterality: Right;  RECONSTRUCTION OF RIGHT LATERAL ELBOW WITH TENDONS  . Mass excision  03/08/2012    Procedure: EXCISION MASS;  Surgeon: Wyn Forster., MD;  Location: [facility];  Service: Orthopedics;  Laterality: Right;  EXCISION OF RIGHT WRIST DORSAL CYST  . Tonsillectomy      52yrs old  . Breast surgery  age 56    left breast bx- benign  . Breast lumpectomy with needle localization and axillary sentinel lymph node bx Left 12/21/2012    Procedure: BREAST LUMPECTOMY WITH NEEDLE LOCALIZATION AND AXILLARY SENTINEL LYMPH NODE BX;  Surgeon: Currie Paris, MD;  Location: [facility];  Service: General;  Laterality: Left;  needle local BCG      Family History: Family History  Problem Relation Age of Onset  . Hyperthyroidism Mother   . Cirrhosis Father   . Diabetes Brother   . Heart attack Maternal Grandmother   . Heart attack Maternal Grandfather     Social History History  Substance Use Topics  . Smoking status:  Current Every Day Smoker -- 1.00 packs/day for 20 years  . Smokeless tobacco: Never Used  . Alcohol Use: Yes     Comment: rare    Allergies: Allergies  Allergen Reactions  . Ibuprofen Hives  . Keflex [Cephalexin] Hives  . Ketorolac Hives  . Lodine [Etodolac] Hives  . Other     Ibegrofon: this may be a misspelling of IBUUPROFEN per patient  . Oxycodone Nausea And Vomiting  . Robaxin [Methocarbamol] Hives    Current Medications: Current Outpatient Prescriptions  Medication Sig Dispense Refill  . ALPRAZolam (XANAX) 0.25 MG tablet Take 0.25 mg by mouth at bedtime as needed for sleep.      . hyaluronate sodium (RADIAPLEXRX) GEL Apply topically once.       No  current facility-administered medications for this visit.        Fertility Discussion:not applicable Prior History of Cancer: no  Health Maintenance:  Colonoscopy unknown Bone Density unknown Last PAP smearunknown  ECOG PERFORMANCE STATUS: 0 - Asymptomatic  Genetic Counseling/testing: no  REVIEW OF SYSTEMS:  Constitutional: negative Ears, nose, mouth, throat, and face: negative Respiratory: negative Cardiovascular: negative Gastrointestinal: negative Genitourinary:negative Integument/breast: positive for breast tenderness Hematologic/lymphatic: negative Musculoskeletal:positive for arthralgias, bone pain and stiff joints Neurological: negative  PHYSICAL EXAMINATION: Blood pressure 112/66, pulse 104, temperature 99.5 F (37.5 C), temperature source Oral, resp. rate 19, height 5' 5.5" (1.664 m), weight 139 lb 1 oz (63.078 kg).  ZOX:WRUEA, healthy, no distress, well nourished and well developed SKIN: skin color, texture, turgor are normal HEAD: Normocephalic EYES: PERRLA, EOMI, Conjunctiva are pink and non-injected EARS: External ears normal OROPHARYNX:no exudate, no erythema and lips, buccal mucosa, and tongue normal  NECK: supple, no adenopathy, thyroid normal size, non-tender, without nodularity LYMPH:  no palpable lymphadenopathy, no hepatosplenomegaly, no cervical supraclavicular or axillary adenopathy BREAST:right breast normal without mass, skin or nipple changes or axillary nodes, surgical scars noted in the left breast no evidence of infections no nodularity. No nipple discharge. LUNGS: clear to auscultation and percussion HEART: regular rate & rhythm, no murmurs and no gallops ABDOMEN:abdomen soft, non-tender, normal bowel sounds and no masses or organomegaly BACK: Back symmetric, no curvature., No CVA tenderness EXTREMITIES:no edema, no clubbing, no cyanosis  NEURO: alert & oriented x 3 with fluent speech, no focal motor/sensory deficits, gait normal,  reflexes normal and symmetric     STUDIES/RESULTS: No results found.   LABS:    Chemistry      Component Value Date/Time   NA 144 01/05/2013 1409   K 4.6 01/05/2013 1409   CO2 27 01/05/2013 1409   BUN 8.9 01/05/2013 1409   CREATININE 0.9 01/05/2013 1409      Component Value Date/Time   CALCIUM 9.7 01/05/2013 1409   ALKPHOS 89 01/05/2013 1409   AST 13 01/05/2013 1409   ALT 13 01/05/2013 1409   BILITOT 0.55 01/05/2013 1409      Lab Results  Component Value Date   WBC 6.4 01/05/2013   HGB 14.3 01/05/2013   HCT 42.3 01/05/2013   MCV 91.0 01/05/2013   PLT 203 01/05/2013   PATHOLOGY: ADDITIONAL INFORMATION: 1. CHROMOGENIC IN-SITU HYBRIDIZATION Results: HER-2/NEU BY CISH - NO AMPLIFICATION OF HER-2 DETECTED. RESULT RATIO OF HER2: CEP 17 SIGNALS 1.28 AVERAGE HER2 COPY NUMBER PER CELL 1.85 REFERENCE RANGE NEGATIVE HER2/Chr17 Ratio <2.0 and Average HER2 copy number <4.0 EQUIVOCAL HER2/Chr17 Ratio <2.0 and Average HER2 copy number 4.0 and <6.0 POSITIVE HER2/Chr17 Ratio >=2.0 and/or Average HER2 copy number >=6.0 Doris Leisure MD  Pathologist, Electronic Signature ( Signed 12/28/2012) FINAL DIAGNOSIS Diagnosis 1. Breast, lumpectomy, left - INVASIVE DUCTAL CARCINOMA, SEE COMMENT. - INVASIVE TUMOR IS 0.3 CM FROM NEAREST MARGIN (POSTERIOR). - NO LYMPHOVASCULAR INVASION IDENTIFIED. - SEE TUMOR SYNOPTIC TEMPLATE BELOW. 2. Lymph node, sentinel, biopsy, left, axillary - ONE LYMPH NODE, NEGATIVE FOR TUMOR (0/1). 3. Lymph node, sentinel, biopsy, left, axilla - ONE LYMPH NODE, NEGATIVE FOR TUMOR (0/1). Microscopic Comment 1. BREAST, INVASIVE TUMOR, WITH LYMPH NODE SAMPLING 1 of 3 FINAL for Doris Bender, Doris Bender [phone]) Microscopic Comment(continued) Specimen, including laterality: Left breast. Procedure: Lumpectomy. Grade: I of III Tubule formation: 1 Nuclear pleomorphism: 1 Mitotic:1 Tumor size (gross measurement): 0.7 cm Margins: Invasive, distance to closest margin: 0.3 cm In-situ,  distance to closest margin: N/A If margin positive, focally or broadly: N/A Lymphovascular invasion: Absent. Ductal carcinoma in situ: Absent. Grade: N/A Extensive intraductal component: N/A Lobular neoplasia: Absent. Tumor focality: Unifocal Treatment effect: None If present, treatment effect in breast tissue, lymph nodes or both: N/A Extent of tumor: Skin: N/A Nipple: N/A Skeletal muscle: N/A Lymph nodes: # examined: 2 Lymph nodes with metastasis: 0 Isolated tumor cells (< 0.2 mm): N/A Micrometastasis: (> 0.2 mm and < 2.0 mm): N/A Macrometastasis: (> 2.0 mm): N/A Extracapsular extension: N/A Breast prognostic profile: Estrogen receptor: Not repeated, previous biopsy demonstrated 90% positivity (outside report) Progesterone receptor: Not repeated, previous biopsy demonstrated 0% positivity (outside report) Her 2 neu: Repeated, previous study demonstrated no amplification (outside report). Ki-67: Not repeated, previous study demonstrated 12% proliferation rate (outside report) Non-neoplastic breast: Previous biopsy site, microcalcifications, and fibrocystic change. 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 Final pathology revealed a 0.7 cm low-grade invasive ductal carcinoma that was ER positive PR negative with a low Ki-67 of 12%. Postoperatively she is doing well. Patient is an excellent candidate for radiation and antiestrogen therapy. I do not believe that she needs chemotherapy due to the low risk nature of her disease. Her overall prognosis is excellent.    #2  S/p radiation therapy administered 02/21/13 - 04/07/13  #3 begin tamoxifen 20 mg daily for 10 years    Clinical Trial Eligibility:no Multidisciplinary  conference discussion yes  PLAN:  1. Tamoxifen 20 mg  Daily  2. We will check hormones levels  3. We will see her back in 3 months    Discussion: Patient is being treated per NCCN breast cancer care guidelines appropriate for stage.1   Thank you so much for allowing me to participate in the care of Doris Bender. I will continue to follow up the patient with you and assist in her care.  All questions were answered. The patient knows to call the clinic with any problems, questions or concerns. We can certainly see the patient much sooner if necessary.  I spent  20 minutes counseling the patient face to face. The total time spent in the appointment was 30 minutes.  Doris Second, MD Medical/Oncology [facility] [phone] (beeper) [phone] (Office)

## 2013-04-13 NOTE — Telephone Encounter (Signed)
, °

## 2013-04-13 NOTE — Patient Instructions (Signed)

## 2013-04-14 ENCOUNTER — Encounter (INDEPENDENT_AMBULATORY_CARE_PROVIDER_SITE_OTHER): Payer: Self-pay

## 2013-04-14 ENCOUNTER — Encounter (INDEPENDENT_AMBULATORY_CARE_PROVIDER_SITE_OTHER): Payer: Self-pay | Admitting: Surgery

## 2013-04-14 ENCOUNTER — Ambulatory Visit (INDEPENDENT_AMBULATORY_CARE_PROVIDER_SITE_OTHER): Payer: BC Managed Care – PPO | Admitting: Surgery

## 2013-04-14 VITALS — BP 110/58 | HR 92 | Temp 98.7°F | Resp 15 | Ht 65.5 in | Wt 139.6 lb

## 2013-04-14 DIAGNOSIS — C50919 Malignant neoplasm of unspecified site of unspecified female breast: Secondary | ICD-10-CM

## 2013-04-14 DIAGNOSIS — C50912 Malignant neoplasm of unspecified site of left female breast: Secondary | ICD-10-CM

## 2013-04-14 NOTE — Progress Notes (Signed)
NAME: Doris Bender       DOB: Nov 10, 1960           DATE: 04/14/2013        MRN: [id number]  CC:   Chief Complaint  Patient presents with  . 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 She has no problems or concerns on either side.She feels that she did very well with the radiation therapy with no burning or blistering  PFSH: She has had no significant changes since the last visit here.  ROS: There have been no significant changes since the last visit here  EXAM:  VS: BP 110/58  Pulse 92  Temp(Src) 98.7 F (37.1 C) (Temporal)  Resp 15  Ht 5' 5.5" (1.664 m)  Wt 139 lb 9.6 oz (63.322 kg)  BMI 22.87 kg/m2  General: The patient is alert, oriented, generally healthy appearing, NAD. Mood and affect are normal.  Breasts:  The right breast is normal. The left breast shows some minimal radiation changes. Somewhat firm throughout. There is no dominant mass. She is slightly tender. The nipple is slightly deformed at the 2:00 position from surgery.  Lymphatics: She has no axillary or supraclavicular adenopathy on either side.  Extremities: Full ROM of the surgical side with no lymphedema noted.  Data Reviewed: I reviewed the medical and radiation oncology notes  Impression: Doing well, with no evidence of recurrent cancer or new cancer  Plan: Will continue to follow up on an annual basis here.Her next visit should be about August which will be one year from her surgery. She'll have a mammogram prior to that visit.

## 2013-04-14 NOTE — Patient Instructions (Signed)
Continue annual mammograms and annual followups in this office

## 2013-04-16 ENCOUNTER — Encounter: Payer: Self-pay | Admitting: Radiation Oncology

## 2013-04-16 NOTE — Progress Notes (Signed)
  Radiation Oncology         [phone] ________________________________  Name: Doris Bender MRN: [id number]  Date: 04/16/2013  DOB: 1960-10-01  End of Treatment Note  Diagnosis:     Stage I invasive ductal carcinoma of the left breast, (pT1b, pNo, Mx)    Indication for treatment:  Breast conservation therapy       Radiation treatment dates:   October 20 through December 5  Site/dose:   Left breast 50.4 gray in 28 fractions, the site of presentation in the upper outer quadrant of the breast was boosted to a cumulative dose of 62.4 gray  Beams/energy:   Initial treatment was with tangential beams using breath hold technique, a 3-D conformal; boost field was with a custom electron cutout field using 9 MeV  electrons prescribed to the 90% isodose line.  Narrative: The patient tolerated radiation treatment relatively well.   She had some mild fatigue but was able to continue her usual work schedule. She had minimal skin irritation with no moist desquamation.  Plan: The patient has completed radiation treatment. The patient will return to radiation oncology clinic for routine followup in one month. I advised them to call or return sooner if they have any questions or concerns related to their recovery or treatment.  -----------------------------------  Billie Lade, PhD, MD

## 2013-04-16 NOTE — Progress Notes (Signed)
  Radiation Oncology         [phone] ________________________________  Name: Doris Bender MRN: [id number]  Date: 04/16/2013  DOB: 1960/11/28  Simulation Verification Note-performed on October 17  Status: outpatient  NARRATIVE: The patient was brought to the treatment unit and placed in the planned treatment position. The clinical setup was verified. Then port films were obtained and uploaded to the radiation oncology medical record software.  The treatment beams were carefully compared against the planned radiation fields. The position location and shape of the radiation fields was reviewed. They targeted volume of tissue appears to be appropriately covered by the radiation beams. Organs at risk appear to be excluded as planned.  Based on my personal review, I approved the simulation verification. The patient's treatment will proceed as planned.  -----------------------------------  Billie Lade, PhD, MD

## 2013-05-01 ENCOUNTER — Encounter: Payer: Self-pay | Admitting: Oncology

## 2013-05-04 DIAGNOSIS — C801 Malignant (primary) neoplasm, unspecified: Secondary | ICD-10-CM | POA: Insufficient documentation

## 2013-05-08 ENCOUNTER — Encounter: Payer: Self-pay | Admitting: Radiation Oncology

## 2013-05-08 ENCOUNTER — Ambulatory Visit
Admission: RE | Admit: 2013-05-08 | Discharge: 2013-05-08 | Disposition: A | Discharge status: Home / Self Care | Payer: BC Managed Care – PPO | Source: Ambulatory Visit | Attending: Radiation Oncology | Admitting: Radiation Oncology

## 2013-05-08 VITALS — BP 97/61 | HR 89 | Temp 98.6°F | Resp 20 | Ht 65.0 in | Wt 139.7 lb

## 2013-05-08 DIAGNOSIS — C50912 Malignant neoplasm of unspecified site of left female breast: Secondary | ICD-10-CM

## 2013-05-08 NOTE — Progress Notes (Signed)
Follow up  Left breast  Rad txs, 02/20/13-04/07/13, well healed, no pain just some tenderness still, appetite good, energy level better, taking tamoxifen 20mg  oral daily  3:51 PM

## 2013-05-08 NOTE — Progress Notes (Signed)
  Radiation Oncology         [phone] ________________________________  Name: Doris Bender MRN: [id number]  Date: 05/08/2013  DOB: [birth year]  Follow-Up Visit Note  CC: Rory Percy, MD  Haywood Lasso, MD  Diagnosis:   Stage I invasive ductal carcinoma of the left breast  Interval Since Last Radiation:  1  months  Narrative:  The patient returns today for routine follow-up.  She is doing well at this time. She continues to have some mild fatigue. She is tolerating tamoxifen well thus far. She does have some sensitivity in the breast area.                              ALLERGIES:  is allergic to ibuprofen; keflex; ketorolac; lodine; other; oxycodone; and robaxin.  Meds: Current Outpatient Prescriptions  Medication Sig Dispense Refill  . ALPRAZolam (XANAX) 0.25 MG tablet Take 0.25 mg by mouth at bedtime as needed for sleep.      . tamoxifen (NOLVADEX) 20 MG tablet Take 1 tablet (20 mg total) by mouth daily.  90 tablet  12   No current facility-administered medications for this encounter.    Physical Findings: The patient is in no acute distress. Patient is alert and oriented.  height is 5\' 5"  (1.651 m) and weight is 139 lb 11.2 oz (63.368 kg). Her oral temperature is 98.6 F (37 C). Her blood pressure is 97/61 and her pulse is 89. Her respiration is 20. Marland Kitchen  No palpable supraclavicular or axillary adenopathy. The lungs are clear to auscultation. The heart has a regular rhythm and rate. The right breast shows no palpable mass nipple discharge or bleeding. The left breast shows mild hyperpigmentation changes. There is some edema in the breast but no dominant mass. The patient's skin is well healed at this time.  Lab Findings: Lab Results  Component Value Date   WBC 5.6 04/13/2013   HGB 14.0 04/13/2013   HCT 40.5 04/13/2013   MCV 91.3 04/13/2013   PLT 188 04/13/2013      Radiographic Findings: No results found.  Impression:  The patient is recovering from the  effects of radiation.  No evidence of recurrence on clinical exam today  Plan:  Routine followup in 6 months  ____________________________________ Blair Promise, MD

## 2013-05-09 ENCOUNTER — Other Ambulatory Visit: Payer: Self-pay | Admitting: Emergency Medicine

## 2013-05-09 MED ORDER — ANASTROZOLE 1 MG PO TABS
1.0000 mg | ORAL_TABLET | Freq: Every day | ORAL | Status: DC
Start: 2013-05-09 — End: 2013-05-31

## 2013-05-12 ENCOUNTER — Telehealth: Payer: Self-pay | Admitting: *Deleted

## 2013-05-12 NOTE — Telephone Encounter (Signed)
Patient called and stated,"I finish Tamoxifen on Sunday, when do I need to start the Arimidex?" Per Dr. Humphrey Rolls, she can start the Arimidex on Monday. Patient verbalized understanding.

## 2013-05-18 ENCOUNTER — Encounter (HOSPITAL_COMMUNITY): Payer: Self-pay | Admitting: Emergency Medicine

## 2013-05-18 ENCOUNTER — Emergency Department (HOSPITAL_COMMUNITY)
Admission: EM | Admit: 2013-05-18 | Discharge: 2013-05-18 | Disposition: A | Discharge status: Home / Self Care | Payer: BC Managed Care – PPO | Attending: Emergency Medicine | Admitting: Emergency Medicine

## 2013-05-18 DIAGNOSIS — Z862 Personal history of diseases of the blood and blood-forming organs and certain disorders involving the immune mechanism: Secondary | ICD-10-CM | POA: Insufficient documentation

## 2013-05-18 DIAGNOSIS — F17200 Nicotine dependence, unspecified, uncomplicated: Secondary | ICD-10-CM | POA: Insufficient documentation

## 2013-05-18 DIAGNOSIS — R109 Unspecified abdominal pain: Secondary | ICD-10-CM | POA: Insufficient documentation

## 2013-05-18 DIAGNOSIS — M545 Low back pain, unspecified: Secondary | ICD-10-CM | POA: Insufficient documentation

## 2013-05-18 DIAGNOSIS — M549 Dorsalgia, unspecified: Secondary | ICD-10-CM

## 2013-05-18 DIAGNOSIS — Z8679 Personal history of other diseases of the circulatory system: Secondary | ICD-10-CM | POA: Insufficient documentation

## 2013-05-18 DIAGNOSIS — Z853 Personal history of malignant neoplasm of breast: Secondary | ICD-10-CM | POA: Insufficient documentation

## 2013-05-18 DIAGNOSIS — Z79899 Other long term (current) drug therapy: Secondary | ICD-10-CM | POA: Insufficient documentation

## 2013-05-18 DIAGNOSIS — Z8781 Personal history of (healed) traumatic fracture: Secondary | ICD-10-CM | POA: Insufficient documentation

## 2013-05-18 DIAGNOSIS — Z8639 Personal history of other endocrine, nutritional and metabolic disease: Secondary | ICD-10-CM | POA: Insufficient documentation

## 2013-05-18 DIAGNOSIS — M069 Rheumatoid arthritis, unspecified: Secondary | ICD-10-CM | POA: Insufficient documentation

## 2013-05-18 LAB — URINALYSIS, ROUTINE W REFLEX MICROSCOPIC
Bilirubin Urine: NEGATIVE
GLUCOSE, UA: NEGATIVE mg/dL
Hgb urine dipstick: NEGATIVE
Leukocytes, UA: NEGATIVE
Nitrite: NEGATIVE
PH: 5.5 (ref 5.0–8.0)
Protein, ur: NEGATIVE mg/dL
SPECIFIC GRAVITY, URINE: 1.025 (ref 1.005–1.030)
Urobilinogen, UA: 0.2 mg/dL (ref 0.0–1.0)

## 2013-05-18 LAB — CBC WITH DIFFERENTIAL/PLATELET
Basophils Absolute: 0 10*3/uL (ref 0.0–0.1)
Basophils Relative: 0 % (ref 0–1)
Eosinophils Absolute: 0.1 10*3/uL (ref 0.0–0.7)
Eosinophils Relative: 1 % (ref 0–5)
HEMATOCRIT: 40.6 % (ref 36.0–46.0)
HEMOGLOBIN: 14.2 g/dL (ref 12.0–15.0)
LYMPHS ABS: 0.8 10*3/uL (ref 0.7–4.0)
Lymphocytes Relative: 12 % (ref 12–46)
MCH: 32.1 pg (ref 26.0–34.0)
MCHC: 35 g/dL (ref 30.0–36.0)
MCV: 91.6 fL (ref 78.0–100.0)
MONO ABS: 0.2 10*3/uL (ref 0.1–1.0)
MONOS PCT: 3 % (ref 3–12)
NEUTROS ABS: 5.1 10*3/uL (ref 1.7–7.7)
Neutrophils Relative %: 83 % — ABNORMAL HIGH (ref 43–77)
Platelets: 180 10*3/uL (ref 150–400)
RBC: 4.43 MIL/uL (ref 3.87–5.11)
RDW: 12.6 % (ref 11.5–15.5)
WBC: 6.1 10*3/uL (ref 4.0–10.5)

## 2013-05-18 LAB — BASIC METABOLIC PANEL
BUN: 12 mg/dL (ref 6–23)
CO2: 23 meq/L (ref 19–32)
Calcium: 9.3 mg/dL (ref 8.4–10.5)
Chloride: 103 mEq/L (ref 96–112)
Creatinine, Ser: 0.79 mg/dL (ref 0.50–1.10)
GFR calc non Af Amer: >90 mL/min (ref >90)
GLUCOSE: 96 mg/dL (ref 70–99)
POTASSIUM: 4.4 meq/L (ref 3.7–5.3)
Sodium: 141 mEq/L (ref 137–147)

## 2013-05-18 MED ORDER — HYDROCODONE-ACETAMINOPHEN 5-325 MG PO TABS
2.0000 | ORAL_TABLET | Freq: Once | ORAL | Status: AC
  Administered 2013-05-18: 2 via ORAL
  Filled 2013-05-18: qty 2

## 2013-05-18 MED ORDER — HYDROCODONE-ACETAMINOPHEN 5-325 MG PO TABS: 1.0000 | ORAL_TABLET | ORAL | Status: DC | PRN

## 2013-05-18 MED ORDER — ONDANSETRON 8 MG PO TBDP: 8.0000 mg | ORAL_TABLET | Freq: Once | ORAL | Status: DC

## 2013-05-18 NOTE — ED Provider Notes (Signed)
Patient seen/examined in the Emergency Department in conjunction with Midlevel Provider [facility] Patient reports abd pain Exam : abd soft, minimal tenderness in LLQ, but she is well appearing Plan: I gave pt option to have further imaging/evaluation.  She reports since she feels improved, no distress noted and labs reassuring, she will defer imaging at this time.  We discussed strict return precautions    Sharyon Cable, MD 05/18/13 1540

## 2013-05-18 NOTE — Discharge Instructions (Signed)

## 2013-05-18 NOTE — ED Provider Notes (Signed)
CSN: [id number]     Arrival date & time 05/18/13  0901 History   First MD Initiated Contact with Patient 05/18/13 [phone]     Chief Complaint  Patient presents with  . Flank Pain   (Consider location/radiation/quality/duration/timing/severity/associated sxs/prior Treatment) HPI Comments: 53 year old female with a history of L4 compression fracture, arthritis, and osteoporosis presents today for left sided low back pain with onset yesterday. Patient states the pain is aching in nature, constant, and radiates around to her left lower abdomen as well as down her left leg. Patient states that she has tried stretching for symptoms without relief. She states that walking and standing make the pain worse, but sometimes she feels as though she needs to stand rather than remain seated. Patient denies any trauma or injury to her back. She further denies associated fever, nausea or vomiting, diarrhea, melena or hematochezia, dysuria or hematuria, numbness or weakness in her extremities, bowel/bladder incontinence, saddle anesthesia, and genital or perianal numbness.  Patient is a 53 y.o. female presenting with flank pain. The history is provided by the patient. No language interpreter was used.  Flank Pain This is a new problem. The current episode started yesterday. The problem occurs constantly. The problem has been gradually worsening. Associated symptoms include abdominal pain. Pertinent negatives include no change in bowel habit, chest pain, fever, numbness, urinary symptoms, vomiting or weakness. Exacerbated by: standing and walking. Treatments tried: stretching. The treatment provided no relief.    Past Medical History  Diagnosis Date  . No pertinent past medical history   . Contact lens/glasses fitting     wears contacts or glasses  . Wears partial dentures     partial lower  . Cancer     breast  . Compression fracture of L4 lumbar vertebra 11/04/2012    injury  . Raynaud's disease     bilateral  hands and feet  . Arthritis     lt great toe (RA)  . Osteoporosis   . Pseudogout of joint of left foot 6 yrs ago  . Gout 8-9 yrs ago    Rt heel/foot  . Breast cancer 11/15/12    left  . History of radiation therapy 02/20/13-04/07/13    left breast 50.4 gray, upper outer quadrant boosted to 62.4 gray   Past Surgical History  Procedure Laterality Date  . Wrist ganglion excision      left x2  . Tonsillectomy    . Knee arthroscopy  2006    right  . Cervical conization w/bx  2012    benign  . Lateral epicondyle release  03/08/2012    Procedure: TENNIS ELBOW RELEASE;  Surgeon: Cammie Sickle., MD;  Location: [facility];  Service: Orthopedics;  Laterality: Right;  RECONSTRUCTION OF RIGHT LATERAL ELBOW WITH TENDONS  . Mass excision  03/08/2012    Procedure: EXCISION MASS;  Surgeon: Cammie Sickle., MD;  Location: [place];  Service: Orthopedics;  Laterality: Right;  EXCISION OF RIGHT WRIST DORSAL CYST  . Tonsillectomy      53yrs old  . Breast surgery  age 80    left breast bx- benign  . Breast lumpectomy with needle localization and axillary sentinel lymph node bx Left 12/21/2012    Procedure: BREAST LUMPECTOMY WITH NEEDLE LOCALIZATION AND AXILLARY SENTINEL LYMPH NODE BX;  Surgeon: Haywood Lasso, MD;  Location: [facility];  Service: General;  Laterality: Left;  needle local BCG     Family History  Problem  Relation Age of Onset  . Hyperthyroidism Mother   . Cirrhosis Father   . Diabetes Brother   . Heart attack Maternal Grandmother   . Heart attack Maternal Grandfather    History  Substance Use Topics  . Smoking status: Current Every Day Smoker -- 1.00 packs/day for 20 years  . Smokeless tobacco: Never Used  . Alcohol Use: Yes     Comment: rare   OB History   Grav Para Term Preterm Abortions TAB SAB Ect Mult Living                 Review of Systems  Constitutional: Negative for fever.  Respiratory: Negative for  shortness of breath.   Cardiovascular: Negative for chest pain.  Gastrointestinal: Positive for abdominal pain. Negative for vomiting, diarrhea, blood in stool and change in bowel habit.  Genitourinary: Positive for flank pain. Negative for dysuria and hematuria.  Neurological: Negative for weakness and numbness.  All other systems reviewed and are negative.    Allergies  Ibuprofen; Keflex; Ketorolac; Lodine; Other; Oxycodone; and Robaxin  Home Medications   Current Outpatient Rx  Name  Route  Sig  Dispense  Refill  . ALPRAZolam (XANAX) 0.25 MG tablet   Oral   Take 0.25 mg by mouth at bedtime as needed for sleep.         Marland Kitchen anastrozole (ARIMIDEX) 1 MG tablet   Oral   Take 1 tablet (1 mg total) by mouth daily.   90 tablet   4   . azithromycin (ZITHROMAX) 500 MG tablet   Oral   Take 500 mg by mouth daily. Starting 05/11/13 x 3 days.         Marland Kitchen HYDROcodone-acetaminophen (NORCO/VICODIN) 5-325 MG per tablet   Oral   Take 1 tablet by mouth every 4 (four) hours as needed.   6 tablet   0    BP 127/76  Pulse 60  Temp(Src) 98.7 F (37.1 C)  Resp 18  Ht 5\' 5"  (1.651 m)  Wt 138 lb (62.596 kg)  BMI 22.96 kg/m2  Physical Exam  Nursing note and vitals reviewed. Constitutional: She is oriented to person, place, and time. She appears well-developed and well-nourished. No distress.  HENT:  Head: Normocephalic and atraumatic.  Mouth/Throat: Oropharynx is clear and moist. No oropharyngeal exudate.  Eyes: Conjunctivae and EOM are normal. No scleral icterus.  Neck: Normal range of motion.  Cardiovascular: Normal rate, regular rhythm and normal heart sounds.   Pulmonary/Chest: [facility] normal and breath sounds normal. No respiratory distress. She has no wheezes. She has no rales.  Abdominal: Soft. Normal appearance. She exhibits no mass. There is tenderness (LLQ) in the left lower quadrant. There is no rigidity, no rebound, no guarding and no CVA tenderness.    No peritoneal signs  or guarding. No CVA tenderness.  Musculoskeletal: Normal range of motion. She exhibits tenderness.       Thoracic back: Normal.       Lumbar back: She exhibits tenderness. She exhibits normal range of motion, no swelling, no deformity and no laceration.       Back:  Tenderness to palpation of the left lumbar paraspinal muscles. No bony deformities or step-offs palpated. Negative straight leg raise and crossed straight leg raise.  Neurological: She is alert and oriented to person, place, and time.  Patellar and Achilles reflexes 2+ bilaterally. No gross sensory deficits appreciated.  Skin: Skin is warm and dry. No rash noted. She is not diaphoretic. No erythema. No pallor.  Psychiatric: She has a normal mood and affect. Her behavior is normal.    ED Course  Procedures (including critical care time) Labs Review Labs Reviewed  URINALYSIS, ROUTINE W REFLEX MICROSCOPIC - Abnormal; Notable for the following:    Ketones, ur TRACE (*)    All other components within normal limits  CBC WITH DIFFERENTIAL - Abnormal; Notable for the following:    Neutrophils Relative % 83 (*)    All other components within normal limits  BASIC METABOLIC PANEL   Imaging Review No results found.  EKG Interpretation   None       MDM   1. Abdominal pain   2. Back pain     0945 - Patient presents with L sided low back pain radiating to LLQ and down L leg. Patient declines pain medicine at this time. CBC, BMP, and UA pending.  1100 - Patient without leukocytosis, anemia, or electrolyte imbalance. Kidney function preserved. Urinalysis today without evidence of infection or hematuria.   Suspect that patient's pain is musculoskeletal in origin given her history of L4 fracture and similar back pain symptoms. She, however, denies any new injury or trauma to the area. No red flags or signs concerning for cauda equina. Do not believe emergent imaging of the lumbar spine with x-ray or MR is indicated today. Patient  has been seen by Dr. Christy Gentles who has offered CT abdomen and pelvis for further evaluation of left lower abdominal pain which patient declines at this time. Doubt diverticulitis or colitis given lack of fever and diarrhea. Also doubt pSBO or SBO given normal BMs and lack of emesis. Patient also denies vaginal complaints such as bleeding or discharge; doubt ovarian torsion as symptoms have been persistent since yesterday. No peritoneal signs on exam and no leukocytosis to suggest infectious source.  Patient is hemodynamically stable and appropriate for d/c with Rx for Vicodin for breakthrough pain control and instruction to f/u with her PCP. Return precautions provided.   Antonietta Breach, PA-C 05/18/13 1540

## 2013-05-18 NOTE — ED Provider Notes (Signed)
Medical screening examination/treatment/procedure(s) were conducted as a shared visit with non-physician practitioner(s) and myself.  I personally evaluated the patient during the encounter.  EKG Interpretation   None         Sharyon Cable, MD 05/18/13 1547

## 2013-05-18 NOTE — ED Notes (Signed)
Pt c/o llq abd pain and left flank pain. denies gi/gu sx. States she recently fractures L4 vertebrae.

## 2013-05-30 ENCOUNTER — Telehealth: Payer: Self-pay | Admitting: Emergency Medicine

## 2013-05-30 DIAGNOSIS — C50919 Malignant neoplasm of unspecified site of unspecified female breast: Secondary | ICD-10-CM | POA: Insufficient documentation

## 2013-05-30 NOTE — Telephone Encounter (Signed)
Patient called complaining of multiple symptoms. States she is tired, weak, having dizzy spells, bone pain, no energy. Patient states she stopped the Arimidex on 1/26. States already feeling better today. Advised patient to continue to hold the Arimidex and this office will call her with further instructions per Dr Humphrey Rolls. Patient verbalized understanding.

## 2013-05-31 ENCOUNTER — Telehealth: Payer: Self-pay | Admitting: Emergency Medicine

## 2013-05-31 ENCOUNTER — Other Ambulatory Visit: Payer: Self-pay | Admitting: Emergency Medicine

## 2013-05-31 DIAGNOSIS — M810 Age-related osteoporosis without current pathological fracture: Secondary | ICD-10-CM | POA: Insufficient documentation

## 2013-05-31 DIAGNOSIS — R768 Other specified abnormal immunological findings in serum: Secondary | ICD-10-CM | POA: Insufficient documentation

## 2013-05-31 DIAGNOSIS — I7300 Raynaud's syndrome without gangrene: Secondary | ICD-10-CM | POA: Insufficient documentation

## 2013-05-31 MED ORDER — TAMOXIFEN CITRATE 20 MG PO TABS: 20.0000 mg | ORAL_TABLET | Freq: Every day | ORAL | Status: DC

## 2013-05-31 NOTE — Telephone Encounter (Signed)
Per Dr Humphrey Rolls, instructed patient to continue off the Arimidex and to restart Tamoxifen. Rx for Tamoxifen sent to patient's local pharmacy. Instructed patient to call with any concerns or questions and to follow as scheduled in March 2015. Patient verbalized understanding. Patient does state that she continues to feel better after stopping the Arimidex.

## 2013-06-26 ENCOUNTER — Telehealth: Payer: Self-pay | Admitting: *Deleted

## 2013-06-26 NOTE — Telephone Encounter (Signed)
Patient called complaining of multiple symptoms. Patient stated," My joints and muscles are aching, my back hurts all the time, tired, weakness, I sleep all the time, I'm not functioning very well. I started Tamoxifen 3 weeks ago, and this is how I have felt since taking it." Per Dr. Humphrey Rolls, have patient stop Tamoxifen for two weeks and see how she feels. If she doesn't feel better, she needs to see her PCP. Patient to call our office in two weeks to inform us of how she is doing, if not sooner. Patient verbalized understanding.

## 2013-07-10 ENCOUNTER — Telehealth: Payer: Self-pay | Admitting: *Deleted

## 2013-07-10 NOTE — Telephone Encounter (Signed)
Received call from patient that she feels better since she stopped taking Tamoxifen two weeks ago. Patient stated, "Dr. Donzetta Matters at [facility]  wants to start me on Prolia for osteoporosis. He wants to make sure that Dr. Humphrey Rolls is going to keep me on Tamoxifen before he prescribed this for my osteoporosis." Per Dr. Humphrey Rolls, have patient resume Tamoxifen tomorrow. Dr. Humphrey Rolls is OK with her starting Prolia. I informed patient about starting Tamoxifen and Prolia. Patient verbalized understanding.

## 2013-07-19 ENCOUNTER — Telehealth: Payer: Self-pay | Admitting: *Deleted

## 2013-07-19 NOTE — Telephone Encounter (Signed)
sw pt informed her with her new arrival time. gv appts for 07/24/13 w/ labs@ 3:30pm and ov@ 4pm. Pt is aware....td

## 2013-07-21 ENCOUNTER — Other Ambulatory Visit: Payer: Self-pay | Admitting: *Deleted

## 2013-07-21 DIAGNOSIS — C50912 Malignant neoplasm of unspecified site of left female breast: Secondary | ICD-10-CM

## 2013-07-24 ENCOUNTER — Ambulatory Visit (HOSPITAL_BASED_OUTPATIENT_CLINIC_OR_DEPARTMENT_OTHER): Payer: BC Managed Care – PPO | Admitting: Hematology and Oncology

## 2013-07-24 ENCOUNTER — Other Ambulatory Visit: Payer: BC Managed Care – PPO

## 2013-07-24 ENCOUNTER — Telehealth: Payer: Self-pay

## 2013-07-24 ENCOUNTER — Other Ambulatory Visit (HOSPITAL_BASED_OUTPATIENT_CLINIC_OR_DEPARTMENT_OTHER): Payer: BC Managed Care – PPO

## 2013-07-24 ENCOUNTER — Encounter: Payer: Self-pay | Admitting: Hematology and Oncology

## 2013-07-24 VITALS — BP 106/69 | HR 89 | Temp 98.9°F | Resp 18 | Ht 65.0 in | Wt 136.9 lb

## 2013-07-24 DIAGNOSIS — M899 Disorder of bone, unspecified: Secondary | ICD-10-CM

## 2013-07-24 DIAGNOSIS — C50419 Malignant neoplasm of upper-outer quadrant of unspecified female breast: Secondary | ICD-10-CM

## 2013-07-24 DIAGNOSIS — S32040A Wedge compression fracture of fourth lumbar vertebra, initial encounter for closed fracture: Secondary | ICD-10-CM | POA: Insufficient documentation

## 2013-07-24 DIAGNOSIS — M949 Disorder of cartilage, unspecified: Secondary | ICD-10-CM

## 2013-07-24 DIAGNOSIS — M8448XA Pathological fracture, other site, initial encounter for fracture: Secondary | ICD-10-CM

## 2013-07-24 DIAGNOSIS — C50912 Malignant neoplasm of unspecified site of left female breast: Secondary | ICD-10-CM

## 2013-07-24 DIAGNOSIS — S32009A Unspecified fracture of unspecified lumbar vertebra, initial encounter for closed fracture: Secondary | ICD-10-CM

## 2013-07-24 DIAGNOSIS — M549 Dorsalgia, unspecified: Secondary | ICD-10-CM

## 2013-07-24 DIAGNOSIS — Z170 Estrogen receptor positive status [ER+]: Secondary | ICD-10-CM

## 2013-07-24 LAB — CBC WITH DIFFERENTIAL/PLATELET
BASO%: 1.1 % (ref 0.0–2.0)
Basophils Absolute: 0 10*3/uL (ref 0.0–0.1)
EOS ABS: 0.1 10*3/uL (ref 0.0–0.5)
EOS%: 2.3 % (ref 0.0–7.0)
HEMATOCRIT: 38.6 % (ref 34.8–46.6)
HGB: 12.9 g/dL (ref 11.6–15.9)
LYMPH%: 22.3 % (ref 14.0–49.7)
MCH: 30.5 pg (ref 25.1–34.0)
MCHC: 33.4 g/dL (ref 31.5–36.0)
MCV: 91.4 fL (ref 79.5–101.0)
MONO#: 0.3 10*3/uL (ref 0.1–0.9)
MONO%: 6 % (ref 0.0–14.0)
NEUT%: 68.3 % (ref 38.4–76.8)
NEUTROS ABS: 3 10*3/uL (ref 1.5–6.5)
Platelets: 162 10*3/uL (ref 145–400)
RBC: 4.23 10*6/uL (ref 3.70–5.45)
RDW: 12.9 % (ref 11.2–14.5)
WBC: 4.5 10*3/uL (ref 3.9–10.3)
lymph#: 1 10*3/uL (ref 0.9–3.3)

## 2013-07-24 LAB — COMPREHENSIVE METABOLIC PANEL (CC13)
ALK PHOS: 50 U/L (ref 40–150)
ALT: 15 U/L (ref 0–55)
AST: 13 U/L (ref 5–34)
Albumin: 3.8 g/dL (ref 3.5–5.0)
Anion Gap: 9 mEq/L (ref 3–11)
BILIRUBIN TOTAL: 0.35 mg/dL (ref 0.20–1.20)
BUN: 12.4 mg/dL (ref 7.0–26.0)
CO2: 27 meq/L (ref 22–29)
CREATININE: 0.8 mg/dL (ref 0.6–1.1)
Calcium: 9.1 mg/dL (ref 8.4–10.4)
Chloride: 107 mEq/L (ref 98–109)
GLUCOSE: 106 mg/dL (ref 70–140)
Potassium: 3.6 mEq/L (ref 3.5–5.1)
Sodium: 144 mEq/L (ref 136–145)
Total Protein: 6.2 g/dL — ABNORMAL LOW (ref 6.4–8.3)

## 2013-07-24 NOTE — Progress Notes (Signed)
Doris Bender [id number] Sep 13, 1960 53 y.o. 07/24/2013 5:29 PM  CC  Doris Percy, MD [address] [place] [address] Dr. Neldon Mc Dr. Gery Pray  REASON FOR CONSULTATION:  53 year old female with new diagnosis of stage I left breast cancer. Patient is seen in medical oncology for discussion of treatment options.  STAGE:  Left breast T1 N0 ER positive, PR negative, Ki-6712%  REFERRING PHYSICIAN: Dr. Neldon Mc  Prior oncology history:  Doris Bender is a 53 y.o. female.    #1. Earlier this year had a routine screening mammogram performed that showed a suspicious area in the upper outer aspect of the left breast. 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 Subsequent MRI confirmed a solitary lesion in the upper outer aspect of the left breast. Patient went on to have a partial mastectomy and sentinel lymph node biopsy on 12/21/2012. The final pathology revealed a 0.7 cm tumor all margins were negative. Tumor was low grade estrogen receptor 90% progesterone receptor negative HER-2/neu negative with a Ki-67 12%.   #2. S/p radiation therapy administered 02/21/13 - 04/07/13  #3 begin tamoxifen 20 mg daily for 10 years  Current Therapy: curative intentTamoxifen 20 mg daily   Interval History Patient presents in follow up of her breast cancer currently on Tamoxifen.Patient was placed  first on Tamoxifen and due to bone aching was discontinued.Then was placed on Arimidex and patient developed severe bone pain,dizziness,N/V and back pain and  went  to the ED.Arimidex was discontinued.Patient was started back on tamoxifen which she states caused her preexisting back pain to get worse and patient now has discontinued the Tamoxifen again. Today she reports extreme fatigue due to tamoxifen.She had significant bone and back pain since she restarted Tamoxifen she states. She has seen Orthopedics Dr.Dean for her spinal fracture who performed MRI  lumbar spine in addition to plain  xrays in 11/2012. She has seen  Dr.Cain at [facility] due to her osteoporosis reported and pseudogout.Bone density has been performed.Plan is to start Prolia in 08/2013.Have discussed Kyphoplasty. Patient does not take Calcium due to the pseudogout left knee.She does not take vit D either. She is overdue for pelvic exam.   ast Medical History: Past Medical History  Diagnosis Date  . No pertinent past medical history   . Contact lens/glasses fitting     wears contacts or glasses  . Wears partial dentures     partial lower  . Cancer     breast  . Compression fracture of L4 lumbar vertebra 11/04/2012    injury  . Raynaud's disease     bilateral hands and feet  . Arthritis     lt great toe (RA)  . Osteoporosis   . Pseudogout of joint of left foot 6 yrs ago  . Gout 8-9 yrs ago    Rt heel/foot  . Breast cancer 11/15/12    left  . History of radiation therapy 02/20/13-04/07/13    left breast 50.4 gray, upper outer quadrant boosted to 62.4 gray    Past Surgical History: Past Surgical History  Procedure Laterality Date  . Wrist ganglion excision      left x2  . Tonsillectomy    . Knee arthroscopy  2006    right  . Cervical conization w/bx  2012    benign  . Lateral epicondyle release  03/08/2012    Procedure: TENNIS ELBOW RELEASE;  Surgeon: Cammie Sickle., MD;  Location: [facility];  Service: Orthopedics;  Laterality: Right;  RECONSTRUCTION OF RIGHT LATERAL ELBOW WITH TENDONS  . Mass excision  03/08/2012    Procedure: EXCISION MASS;  Surgeon: Cammie Sickle., MD;  Location: [facility];  Service: Orthopedics;  Laterality: Right;  EXCISION OF RIGHT WRIST DORSAL CYST  . Tonsillectomy      53yr old  . Breast surgery  age 53   left breast bx- benign  . Breast lumpectomy with needle localization and axillary sentinel lymph node bx Left 12/21/2012    Procedure: BREAST LUMPECTOMY WITH NEEDLE LOCALIZATION AND  AXILLARY SENTINEL LYMPH NODE BX;  Surgeon: CHaywood Lasso MD;  Location[facility]  Service: General;  Laterality: Left;  needle local BCG      Family History: Family History  Problem Relation Age of Onset  . Hyperthyroidism Mother   . Cirrhosis Father   . Diabetes Brother   . Heart attack Maternal Grandmother   . Heart attack Maternal Grandfather     Social History History  Substance Use Topics  . Smoking status: Smoker, Current Status Unknown -- 1.00 packs/day for 20 years  . Smokeless tobacco: Never Used  . Alcohol Use: Yes     Comment: rare    Allergies: Allergies  Allergen Reactions  . Ibuprofen Hives  . Keflex [Cephalexin] Hives  . Ketorolac Hives  . Lodine [Etodolac] Hives  . Other     Ibegrofon: this may be a misspelling of IBUUPROFEN per patient  . Oxycodone Nausea And Vomiting  . Robaxin [Methocarbamol] Hives    Current Medications: Current Outpatient Prescriptions  Medication Sig Dispense Refill  . ALPRAZolam (XANAX) 0.25 MG tablet Take 0.25 mg by mouth at bedtime as needed for sleep.      . tamoxifen (NOLVADEX) 20 MG tablet Take 1 tablet (20 mg total) by mouth daily.  30 tablet  12   No current facility-administered medications for this visit.        Fertility Discussion:not applicable Prior History of Cancer: no  Health Maintenance:  Colonoscopy unknown Bone Density unknown Last PAP smearunknown  ECOG PERFORMANCE STATUS: 0 - Asymptomatic  Genetic Counseling/testing: no  REVIEW OF SYSTEMS:As above  Constitutional: negative Ears, nose, mouth, throat, and face: negative Respiratory: negative Cardiovascular: negative Gastrointestinal: negative Genitourinary:negative Integument/breast: positive for breast tenderness Hematologic/lymphatic: negative Musculoskeletal:positive for arthralgias, bone pain and stiff joints Neurological: negative  PHYSICAL EXAMINATION: Blood pressure 106/69, pulse 89, temperature 98.9 F  (37.2 C), temperature source Oral, resp. rate 18, height 5' 5"  (1.651 m), weight 136 lb 14.4 oz (62.097 kg).  RJOA:CZYSA healthy, no distress, well nourished and well developed SKIN: skin color, texture, turgor are normal HEAD: Normocephalic EYES: PERRLA, EOMI, Conjunctiva are pink  EARS: External ears normal OROPHARYNX:no exudate, no erythema and lips, buccal mucosa, and tongue normal  NECK: supple, no adenopathy, thyroid normal size, non-tender, without nodularity LYMPH:  no palpable lymphadenopathy, no hepatosplenomegaly, no cervical supraclavicular or axillary adenopathy BREAST:right breast normal without mass, skin or nipple changes or axillary nodes, surgical scars noted in the left breast no evidence of infections no nodularity. No nipple discharge. LUNGS: clear to auscultation and percussion HEART: regular rate & rhythm, no murmurs and no gallops ABDOMEN:abdomen soft, non-tender, normal bowel sounds and no masses or organomegaly BACK: Back symmetric, no curvature.,  CVA tenderness lumbar spine EXTREMITIES:no edema, no clubbing, no cyanosis  NEURO: alert & oriented x 3 with fluent speech, no focal motor/sensory deficits Patient has trouble on and off the bed,rotating in  bed.Low back pain on left leg raise/extension.     STUDIES/RESULTS: No results found.   LABS:    Chemistry      Component Value Date/Time   NA 144 07/24/2013 1511   NA 141 05/18/2013 1018   K 3.6 07/24/2013 1511   K 4.4 05/18/2013 1018   CL 103 05/18/2013 1018   CO2 27 07/24/2013 1511   CO2 23 05/18/2013 1018   BUN 12.4 07/24/2013 1511   BUN 12 05/18/2013 1018   CREATININE 0.8 07/24/2013 1511   CREATININE 0.79 05/18/2013 1018      Component Value Date/Time   CALCIUM 9.1 07/24/2013 1511   CALCIUM 9.3 05/18/2013 1018   ALKPHOS 50 07/24/2013 1511   AST 13 07/24/2013 1511   ALT 15 07/24/2013 1511   BILITOT 0.35 07/24/2013 1511      Lab Results  Component Value Date   WBC 4.5 07/24/2013   HGB 12.9 07/24/2013    HCT 38.6 07/24/2013   MCV 91.4 07/24/2013   PLT 162 07/24/2013   PATHOLOGY: ADDITIONAL INFORMATION: 1. CHROMOGENIC IN-SITU HYBRIDIZATION Results: HER-2/NEU BY CISH - NO AMPLIFICATION OF HER-2 DETECTED. RESULT RATIO OF HER2: CEP 17 SIGNALS 1.28 AVERAGE HER2 COPY NUMBER PER CELL 1.85 REFERENCE RANGE NEGATIVE HER2/Chr17 Ratio <2.0 and Average HER2 copy number <4.0 EQUIVOCAL HER2/Chr17 Ratio <2.0 and Average HER2 copy number 4.0 and <6.0 POSITIVE HER2/Chr17 Ratio >=2.0 and/or Average HER2 copy number >=6.0 Enid Cutter MD Pathologist, Electronic Signature ( Signed 12/28/2012) FINAL DIAGNOSIS Diagnosis 1. Breast, lumpectomy, left - INVASIVE DUCTAL CARCINOMA, SEE COMMENT. - INVASIVE TUMOR IS 0.3 CM FROM NEAREST MARGIN (POSTERIOR). - NO LYMPHOVASCULAR INVASION IDENTIFIED. - SEE TUMOR SYNOPTIC TEMPLATE BELOW. 2. Lymph node, sentinel, biopsy, left, axillary - ONE LYMPH NODE, NEGATIVE FOR TUMOR (0/1). 3. Lymph node, sentinel, biopsy, left, axilla - ONE LYMPH NODE, NEGATIVE FOR TUMOR (0/1). Microscopic Comment 1. BREAST, INVASIVE TUMOR, WITH LYMPH NODE SAMPLING 1 of 3 FINAL for Doris Bender, Doris Bender [phone]) Microscopic Comment(continued) Specimen, including laterality: Left breast. Procedure: Lumpectomy. Grade: I of III Tubule formation: 1 Nuclear pleomorphism: 1 Mitotic:1 Tumor size (gross measurement): 0.7 cm Margins: Invasive, distance to closest margin: 0.3 cm In-situ, distance to closest margin: N/A If margin positive, focally or broadly: N/A Lymphovascular invasion: Absent. Ductal carcinoma in situ: Absent. Grade: N/A Extensive intraductal component: N/A Lobular neoplasia: Absent. Tumor focality: Unifocal Treatment effect: None If present, treatment effect in breast tissue, lymph nodes or both: N/A Extent of tumor: Skin: N/A Nipple: N/A Skeletal muscle: N/A Lymph nodes: # examined: 2 Lymph nodes with metastasis: 0 Isolated tumor cells (< 0.2 mm):  N/A Micrometastasis: (> 0.2 mm and < 2.0 mm): N/A Macrometastasis: (> 2.0 mm): N/A Extracapsular extension: N/A Breast prognostic profile: Estrogen receptor: Not repeated, previous biopsy demonstrated 90% positivity (outside report) Progesterone receptor: Not repeated, previous biopsy demonstrated 0% positivity (outside report) Her 2 neu: Repeated, previous study demonstrated no amplification (outside report). Ki-67: Not repeated, previous study demonstrated 12% proliferation rate (outside report) Non-neoplastic breast: Previous biopsy site, microcalcifications, and fibrocystic change. TNM: pT1b, pN0, pMX (CRR:gt, 12/22/12) Mali RUND DO Pathologist, Electronic Signature (Case signed 12/22/2012)   ASSESSMENT    53 year old female with  #1 new diagnosis of stage I (T1 N0) invasive ductal carcinoma she is status post lumpectomy and sentinel lymph node biopsy performed on 12/21/2012. Final pathology revealed a 0.7 cm low-grade invasive ductal carcinoma that was ER positive PR negative with a low Ki-67 of 12%. Postoperatively she is doing well. Patient is an excellent  candidate for radiation and antiestrogen therapy. I do not believe that she needs chemotherapy due to the low risk nature of her disease. Her overall prognosis is excellent.    #2  S/p radiation therapy administered 02/21/13 - 04/07/13  #3 begin tamoxifen 20 mg daily for 10 years    Clinical Trial Eligibility:no Multidisciplinary conference discussion yes  PLAN:  1. Tamoxifen 20 mg  Daily currently off due to worsening back pain upon restarting Tamoxifen  ? Tamoxifen flare but patient not metastatic to bones as per chart review and at low risk for metastatic to bones disease.  Called and got MRI Lumbar spine without contrast 11/19/2012 report Acute/subacute mild superior endplate compression fracture L4 most consistent with osteoporotic injury.  Bone density 11/17/12 Low bone mass AP spine T -1.6.  Plan to start on  Prolia.Have discussed Kyphoplasty. Will call Radiologist and discuss report in am.   Advised patient to take Vit d 2000 IU once daily and restart Tamoxifen.She said she will.  She will call and arrange for pelvic exam with her GYN.  She needs eye exam.  Will arrange follow up with surgery since Dr.Streck is retiring.  Follow up in 3 months,will call patient if any suggestions after record review,check CBC,CMP,ESR       Discussion: Patient is being treated per NCCN breast cancer care guidelines appropriate for stage.1   All questions were answered. The patient knows to call the clinic with any problems, questions or concerns. We can certainly see the patient much sooner if necessary.  I spent  20 minutes counseling the patient face to face. The total time spent in the appointment was 30 minutes.  Amada Kingfisher, M.D. Oncology/Hematology [facility] [phone] (Office) 07/24/2013

## 2013-07-24 NOTE — Telephone Encounter (Signed)
Requested MRI and bone density results from Dr Dean's(orthopedic) office per Dr Owens Loffler.  Faxed over, given to Dr Owens Loffler and copy placed in scan pile.

## 2013-07-25 ENCOUNTER — Telehealth: Payer: Self-pay | Admitting: Oncology

## 2013-07-25 NOTE — Telephone Encounter (Signed)
s.w. pt and advised on June appt....pt ok adn aware °

## 2013-07-28 ENCOUNTER — Other Ambulatory Visit: Payer: Self-pay | Admitting: *Deleted

## 2013-07-28 ENCOUNTER — Telehealth: Payer: Self-pay | Admitting: *Deleted

## 2013-07-28 NOTE — Telephone Encounter (Signed)
Received voice message from patient stating,"I saw Dr. Owens Loffler Monday and the pain in my back is now in my butt. The pain came back after I restarted Tamoxifen. I didn't go to work today because I could hardly move with joint pain. I'm using a walker to get around. What does Dr. Humphrey Rolls think I have?" Return number is [phone].  Per Dr. Humphrey Rolls, have patient stop Tamoxifen. Patient to call her orthopedics office to make an appointment with Dr. Marlou Sa. Also, she is going to make an appointment with her PCP. Once patient sees these physicians, she has been instructed to call Dr. Laurelyn Sickle office back. Last dose of Tamoxifen was 07/27/13. Patient verbalized understanding.

## 2013-09-05 NOTE — Progress Notes (Signed)
Report rcvd from [place] 08/30/13.  Original sent to scan.  Copy to [place].

## 2013-10-13 ENCOUNTER — Other Ambulatory Visit: Payer: Self-pay | Admitting: *Deleted

## 2013-10-13 DIAGNOSIS — C50912 Malignant neoplasm of unspecified site of left female breast: Secondary | ICD-10-CM

## 2013-10-16 ENCOUNTER — Telehealth: Payer: Self-pay | Admitting: Hematology and Oncology

## 2013-10-16 ENCOUNTER — Other Ambulatory Visit (HOSPITAL_BASED_OUTPATIENT_CLINIC_OR_DEPARTMENT_OTHER): Payer: BC Managed Care – PPO

## 2013-10-16 ENCOUNTER — Ambulatory Visit (HOSPITAL_BASED_OUTPATIENT_CLINIC_OR_DEPARTMENT_OTHER): Payer: BC Managed Care – PPO | Admitting: Hematology and Oncology

## 2013-10-16 VITALS — BP 98/62 | HR 98 | Temp 98.3°F | Resp 18 | Ht 65.5 in | Wt 135.5 lb

## 2013-10-16 DIAGNOSIS — M899 Disorder of bone, unspecified: Secondary | ICD-10-CM

## 2013-10-16 DIAGNOSIS — C50419 Malignant neoplasm of upper-outer quadrant of unspecified female breast: Secondary | ICD-10-CM

## 2013-10-16 DIAGNOSIS — C50912 Malignant neoplasm of unspecified site of left female breast: Secondary | ICD-10-CM

## 2013-10-16 DIAGNOSIS — M949 Disorder of cartilage, unspecified: Secondary | ICD-10-CM

## 2013-10-16 DIAGNOSIS — Z170 Estrogen receptor positive status [ER+]: Secondary | ICD-10-CM

## 2013-10-16 LAB — CBC WITH DIFFERENTIAL/PLATELET
BASO%: 0.4 % (ref 0.0–2.0)
BASOS ABS: 0 10*3/uL (ref 0.0–0.1)
EOS%: 2 % (ref 0.0–7.0)
Eosinophils Absolute: 0.1 10*3/uL (ref 0.0–0.5)
HEMATOCRIT: 38.6 % (ref 34.8–46.6)
HEMOGLOBIN: 12.9 g/dL (ref 11.6–15.9)
LYMPH%: 21.8 % (ref 14.0–49.7)
MCH: 30.9 pg (ref 25.1–34.0)
MCHC: 33.4 g/dL (ref 31.5–36.0)
MCV: 92.3 fL (ref 79.5–101.0)
MONO#: 0.3 10*3/uL (ref 0.1–0.9)
MONO%: 6.7 % (ref 0.0–14.0)
NEUT#: 3.5 10*3/uL (ref 1.5–6.5)
NEUT%: 69.1 % (ref 38.4–76.8)
Platelets: 174 10*3/uL (ref 145–400)
RBC: 4.18 10*6/uL (ref 3.70–5.45)
RDW: 12.7 % (ref 11.2–14.5)
WBC: 5.1 10*3/uL (ref 3.9–10.3)
lymph#: 1.1 10*3/uL (ref 0.9–3.3)

## 2013-10-16 LAB — COMPREHENSIVE METABOLIC PANEL (CC13)
ALT: 18 U/L (ref 0–55)
ANION GAP: 8 meq/L (ref 3–11)
AST: 14 U/L (ref 5–34)
Albumin: 3.6 g/dL (ref 3.5–5.0)
Alkaline Phosphatase: 61 U/L (ref 40–150)
BUN: 11.4 mg/dL (ref 7.0–26.0)
CALCIUM: 8.8 mg/dL (ref 8.4–10.4)
CHLORIDE: 105 meq/L (ref 98–109)
CO2: 30 meq/L — AB (ref 22–29)
CREATININE: 0.9 mg/dL (ref 0.6–1.1)
GLUCOSE: 137 mg/dL (ref 70–140)
Potassium: 3.8 mEq/L (ref 3.5–5.1)
Sodium: 143 mEq/L (ref 136–145)
Total Bilirubin: 0.47 mg/dL (ref 0.20–1.20)
Total Protein: 6.1 g/dL — ABNORMAL LOW (ref 6.4–8.3)

## 2013-10-16 LAB — SEDIMENTATION RATE: Sed Rate: 5 mm/hr (ref 0–22)

## 2013-10-16 NOTE — Progress Notes (Signed)
Doris Bender [id number] 1960-11-14 53 y.o. 10/16/2013 3:52 PM  CC  Rory Percy, MD [address] [place] [address] Dr. Neldon Mc Dr. Gery Pray  Chief complaint: Follow up visit for breast cancer  Diagnosis:   Stage I left breast cancer.   STAGE:  Left breast T1 N0 ER positive, PR negative, Ki-6712%  REFERRING PHYSICIAN: Dr. Neldon Mc  Prior oncology history: As per previously dictated note:  Doris Bender is a 53 y.o. female.    #1. Earlier this year had a routine screening mammogram performed that showed a suspicious area in the upper outer aspect of the left breast. 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 Subsequent MRI confirmed a solitary lesion in the upper outer aspect of the left breast. Patient went on to have a partial mastectomy and sentinel lymph node biopsy on 12/21/2012. The final pathology revealed a 0.7 cm tumor all margins were negative. Tumor was low grade estrogen receptor 90% progesterone receptor negative HER-2/neu negative with a Ki-67 12%.   #2. S/p radiation therapy administered 02/21/13 - 04/07/13  #3 begin tamoxifen 20 mg daily in January 2015  Current Therapy: None  Interval History: Doris Bender 53 years old pleasant lady is here for followup visit for her breast cancer. Since the time of last visit she was seen by the orthopedic and was given 2 injections for her back pain and last injection was couple of weeks ago and was  started on exercises. She feels much improvement in her back pain after the injections. Since tamoxifen aggravated her back pain she stopped taking tamoxifen from April 2015. She says her hot flashes are much better her fatigue is improved overall she says her quality of is life is much better since the time she is off of tamoxifen She has seen Orthopedics Dr.Dean for her spinal fracture who performed MRI lumbar spine in addition to plain  xrays in 11/2012. She has seen  Dr.Cain  at [facility] due to her osteoporosis reported and pseudogout.Bone density has been performed.Plan is to start Prolia in 08/2013.Have discussed Kyphoplasty. Patient does not take Calcium due to the pseudogout left knee.She does not take vit D either. She is overdue for pelvic exam.   ast Medical History: Past Medical History  Diagnosis Date  . No pertinent past medical history   . Contact lens/glasses fitting     wears contacts or glasses  . Wears partial dentures     partial lower  . Cancer     breast  . Compression fracture of L4 lumbar vertebra 11/04/2012    injury  . Raynaud's disease     bilateral hands and feet  . Arthritis     lt great toe (RA)  . Osteoporosis   . Pseudogout of joint of left foot 6 yrs ago  . Gout 8-9 yrs ago    Rt heel/foot  . Breast cancer 11/15/12    left  . History of radiation therapy 02/20/13-04/07/13    left breast 50.4 gray, upper outer quadrant boosted to 62.4 gray    Past Surgical History: Past Surgical History  Procedure Laterality Date  . Wrist ganglion excision      left x2  . Tonsillectomy    . Knee arthroscopy  2006    right  . Cervical conization w/bx  2012    benign  . Lateral epicondyle release  03/08/2012    Procedure: TENNIS ELBOW RELEASE;  Surgeon: Cammie Sickle.,  MD;  Location: [place];  Service: Orthopedics;  Laterality: Right;  RECONSTRUCTION OF RIGHT LATERAL ELBOW WITH TENDONS  . Mass excision  03/08/2012    Procedure: EXCISION MASS;  Surgeon: Cammie Sickle., MD;  Location: [facility];  Service: Orthopedics;  Laterality: Right;  EXCISION OF RIGHT WRIST DORSAL CYST  . Tonsillectomy      53yr old  . Breast surgery  age 53   left breast bx- benign  . Breast lumpectomy with needle localization and axillary sentinel lymph node bx Left 12/21/2012    Procedure: BREAST LUMPECTOMY WITH NEEDLE LOCALIZATION AND AXILLARY SENTINEL LYMPH NODE BX;  Surgeon: CHaywood Lasso MD;  Location[place]  [facility]  Service: General;  Laterality: Left;  needle local BCG      Family History: Family History  Problem Relation Age of Onset  . Hyperthyroidism Mother   . Cirrhosis Father   . Diabetes Brother   . Heart attack Maternal Grandmother   . Heart attack Maternal Grandfather     Social History History  Substance Use Topics  . Smoking status: Smoker, Current Status Unknown -- 1.00 packs/day for 20 years  . Smokeless tobacco: Never Used  . Alcohol Use: Yes     Comment: rare    Allergies: Allergies  Allergen Reactions  . Ibuprofen Hives  . Keflex [Cephalexin] Hives  . Ketorolac Hives  . Lodine [Etodolac] Hives  . Oxycodone Nausea And Vomiting  . Robaxin [Methocarbamol] Hives    Current Medications: Current Outpatient Prescriptions  Medication Sig Dispense Refill  . ALPRAZolam (XANAX) 0.25 MG tablet Take 0.25 mg by mouth at bedtime as needed for sleep.      . tamoxifen (NOLVADEX) 20 MG tablet Take 1 tablet (20 mg total) by mouth daily.  30 tablet  12   No current facility-administered medications for this visit.        Fertility Discussion:not applicable Prior History of Cancer: no  Health Maintenance:  Colonoscopy unknown Bone Density unknown Last PAP smearunknown  ECOG PERFORMANCE STATUS: 0 - Asymptomatic  Genetic Counseling/testing: no  REVIEW OF SYSTEMS:As above  Constitutional: negative Ears, nose, mouth, throat, and face: negative Respiratory: negative Cardiovascular: negative Gastrointestinal: negative Genitourinary:negative Integument/breast: positive for breast tenderness Hematologic/lymphatic: negative Musculoskeletal:positive for arthralgias, bone pain and stiff joints Neurological: negative  PHYSICAL EXAMINATION: Blood pressure 98/62, pulse 98, temperature 98.3 F (36.8 C), temperature source Oral, resp. rate 18, height 5' 5.5" (1.664 m), weight 135 lb 8 oz (61.462 kg).  RWPY:KDXIP healthy, no distress, well  nourished and well developed SKIN: skin color, texture, turgor are normal HEAD: Normocephalic EYES: PERRLA, EOMI, Conjunctiva are pink  EARS: External ears normal OROPHARYNX:no exudate, no erythema and lips, buccal mucosa, and tongue normal  NECK: supple, no adenopathy, thyroid normal size, non-tender, without nodularity LYMPH:  no palpable lymphadenopathy, no hepatosplenomegaly, no cervical supraclavicular or axillary adenopathy BREAST:right breast normal without mass, skin or nipple changes or axillary nodes, surgical scars noted in the left breast no evidence of infections no nodularity. No nipple discharge. LUNGS: clear to auscultation and percussion HEART: regular rate & rhythm, no murmurs and no gallops ABDOMEN:abdomen soft, non-tender, normal bowel sounds and no masses or organomegaly BACK: Back symmetric, no curvature.,  CVA tenderness lumbar spine EXTREMITIES:no edema, no clubbing, no cyanosis  NEURO: alert & oriented x 3 with fluent speech, no focal motor/sensory deficits Patient has trouble on and off the bed,rotating in bed.Low back pain on left leg raise/extension.  STUDIES/RESULTS: No results found.   LABS:    Chemistry      Component Value Date/Time   NA 143 10/16/2013 1431   NA 141 05/18/2013 1018   K 3.8 10/16/2013 1431   K 4.4 05/18/2013 1018   CL 103 05/18/2013 1018   CO2 30* 10/16/2013 1431   CO2 23 05/18/2013 1018   BUN 11.4 10/16/2013 1431   BUN 12 05/18/2013 1018   CREATININE 0.9 10/16/2013 1431   CREATININE 0.79 05/18/2013 1018      Component Value Date/Time   CALCIUM 8.8 10/16/2013 1431   CALCIUM 9.3 05/18/2013 1018   ALKPHOS 61 10/16/2013 1431   AST 14 10/16/2013 1431   ALT 18 10/16/2013 1431   BILITOT 0.47 10/16/2013 1431      Lab Results  Component Value Date   WBC 5.1 10/16/2013   HGB 12.9 10/16/2013   HCT 38.6 10/16/2013   MCV 92.3 10/16/2013   PLT 174 10/16/2013   PATHOLOGY: ADDITIONAL INFORMATION: 1. CHROMOGENIC IN-SITU  HYBRIDIZATION Results: HER-2/NEU BY CISH - NO AMPLIFICATION OF HER-2 DETECTED. RESULT RATIO OF HER2: CEP 17 SIGNALS 1.28 AVERAGE HER2 COPY NUMBER PER CELL 1.85 REFERENCE RANGE NEGATIVE HER2/Chr17 Ratio <2.0 and Average HER2 copy number <4.0 EQUIVOCAL HER2/Chr17 Ratio <2.0 and Average HER2 copy number 4.0 and <6.0 POSITIVE HER2/Chr17 Ratio >=2.0 and/or Average HER2 copy number >=6.0 Doris Cutter MD Pathologist, Electronic Signature ( Signed 12/28/2012) FINAL DIAGNOSIS Diagnosis 1. Breast, lumpectomy, left - INVASIVE DUCTAL CARCINOMA, SEE COMMENT. - INVASIVE TUMOR IS 0.3 CM FROM NEAREST MARGIN (POSTERIOR). - NO LYMPHOVASCULAR INVASION IDENTIFIED. - SEE TUMOR SYNOPTIC TEMPLATE BELOW. 2. Lymph node, sentinel, biopsy, left, axillary - ONE LYMPH NODE, NEGATIVE FOR TUMOR (0/1). 3. Lymph node, sentinel, biopsy, left, axilla - ONE LYMPH NODE, NEGATIVE FOR TUMOR (0/1). Microscopic Comment 1. BREAST, INVASIVE TUMOR, WITH LYMPH NODE SAMPLING 1 of 3 FINAL for RONISHA, HERRINGSHAW R [phone]) Microscopic Comment(continued) Specimen, including laterality: Left breast. Procedure: Lumpectomy. Grade: I of III Tubule formation: 1 Nuclear pleomorphism: 1 Mitotic:1 Tumor size (gross measurement): 0.7 cm Margins: Invasive, distance to closest margin: 0.3 cm In-situ, distance to closest margin: N/A If margin positive, focally or broadly: N/A Lymphovascular invasion: Absent. Ductal carcinoma in situ: Absent. Grade: N/A Extensive intraductal component: N/A Lobular neoplasia: Absent. Tumor focality: Unifocal Treatment effect: None If present, treatment effect in breast tissue, lymph nodes or both: N/A Extent of tumor: Skin: N/A Nipple: N/A Skeletal muscle: N/A Lymph nodes: # examined: 2 Lymph nodes with metastasis: 0 Isolated tumor cells (< 0.2 mm): N/A Micrometastasis: (> 0.2 mm and < 2.0 mm): N/A Macrometastasis: (> 2.0 mm): N/A Extracapsular extension: N/A Breast prognostic  profile: Estrogen receptor: Not repeated, previous biopsy demonstrated 90% positivity (outside report) Progesterone receptor: Not repeated, previous biopsy demonstrated 0% positivity (outside report) Her 2 neu: Repeated, previous study demonstrated no amplification (outside report). Ki-67: Not repeated, previous study demonstrated 12% proliferation rate (outside report) Non-neoplastic breast: Previous biopsy site, microcalcifications, and fibrocystic change. TNM: pT1b, pN0, pMX (CRR:gt, 12/22/12) Doris Bender Pathologist, Electronic Signature (Case signed 12/22/2012)   ASSESSMENT/PLAN:    53 year old female with  #1stage I (T1 N0) invasive ductal carcinoma she is status post lumpectomy and sentinel lymph node biopsy performed on 12/21/2012. Final pathology revealed a 0.7 cm low-grade invasive ductal carcinoma that was ER positive PR negative with a low Ki-67 of 12%.   #2  S/p radiation therapy administered 02/21/13 - 04/07/13  #3 begin tamoxifen 20 mg daily for planned for 10 years  #  4 tamoxifen was discontinued in view of severe side effects. Arimidex was started. After using few days Arimidex was discontinued  in view of severe side effects  #5 tamoxifen was restarted and patient stopped taking tamoxifen in April 2015 in view of side effects  #6 MRI Lumbar spine without contrast 11/19/2012 report Acute/subacute mild superior endplate compression fracture L4 most consistent with osteoporotic injury. She is actively Followed by orthopedics  #7 Bone density 11/17/12 Low bone mass AP spine T -1.6   PLAN: I have discussed in detail with the patient on importance of taking antiestrogen therapy. I have reiterated benefits and also side effects of antiestrogen therapy. Patient understood the same and she says since it is impacting her quality of life she does not want to restart antiestrogen therapy at this time.  I have encouraged her to perform monthly self breast exam  She scheduled to  have bilateral diagnostic mammogram and I have given prescription to have ultrasound of both the breasts that she scheduled in [place]  Follow up with Dr. Donzetta Matters as scheduled for prolia injections once every 6 months. Continue calcium with vitamin D supplementation  Will arrange follow up with surgery since Dr.Streck is retiring.  Follow up in 68month with CBC and differential and CMP on the day of next visit   All questions were answered. The patient knows to call the clinic with any problems, questions or concerns. We can certainly see the patient much sooner if necessary.  I spent  20 minutes counseling the patient face to face. The total time spent in the appointment was 30 minutes.  Doris Bender M.D. Oncology/Hematology [phone](Office) 10/16/2013

## 2013-10-16 NOTE — Telephone Encounter (Signed)
, °

## 2013-10-18 ENCOUNTER — Telehealth: Payer: Self-pay | Admitting: Hematology and Oncology

## 2013-10-18 NOTE — Telephone Encounter (Signed)
, °

## 2013-11-06 ENCOUNTER — Ambulatory Visit
Admission: RE | Admit: 2013-11-06 | Discharge: 2013-11-06 | Disposition: A | Discharge status: Home / Self Care | Payer: BC Managed Care – PPO | Source: Ambulatory Visit | Attending: Radiation Oncology | Admitting: Radiation Oncology

## 2013-11-06 ENCOUNTER — Encounter: Payer: Self-pay | Admitting: Radiation Oncology

## 2013-11-06 VITALS — BP 107/62 | HR 86 | Temp 98.2°F | Resp 20 | Ht 65.5 in | Wt 136.7 lb

## 2013-11-06 DIAGNOSIS — C50912 Malignant neoplasm of unspecified site of left female breast: Secondary | ICD-10-CM

## 2013-11-06 NOTE — Progress Notes (Signed)
Follow up left breast  Left breast  02/20/13-04/07/13, well healed , stopped Tamoxifen in March, stated too may side effects, (pain in her joints, sever back pain, extreme fatigue), tried Armidex and went to the ED severe pain in her compression fracture in her back, stopped that, only med taking is xanax prn, appetite good, energy level better, staying hydrated, stated last Mammogram, Bi/Lateral  was in the last 2 weeks at [facility] in [place], and U/S on right breast only stated was normal No nausea 3:47 PM

## 2013-11-06 NOTE — Progress Notes (Signed)
  Radiation Oncology         [phone] ________________________________  Name: Doris Bender MRN: [id number]  Date: 11/06/2013  DOB: 12-20-1960  Follow-Up Visit Note  CC: Rory Percy, MD  Rory Percy, MD  Diagnosis:   Stage I invasive ductal carcinoma of the left breast  Interval Since Last Radiation:  7  months  Narrative:  The patient returns today for routine follow-up.  She is doing reasonably well at this time. She has had some low back pain related to a benign compression fracture. She has recently received 2 injections which is helping her pain. She did take tamoxifen and another antiestrogen which she could not tolerate due to severe fatigue and muscle and joint aches.  She recently underwent imaging at the [facility] in [place] and I requested reports. According to the patient no problem areas in either breast. She has some mild pain in the left axillary area. She denies any nipple discharge or bleeding. She denies any problems with swelling in her left arm or hand.                           ALLERGIES:  is allergic to ibuprofen; keflex; ketorolac; lodine; oxycodone; and robaxin.  Meds: Current Outpatient Prescriptions  Medication Sig Dispense Refill  . ALPRAZolam (XANAX) 0.25 MG tablet Take 0.25 mg by mouth at bedtime as needed for sleep.       No current facility-administered medications for this encounter.    Physical Findings: The patient is in no acute distress. Patient is alert and oriented.  height is 5' 5.5" (1.664 m) and weight is 136 lb 11.2 oz (62.007 kg). Her oral temperature is 98.2 F (36.8 C). Her blood pressure is 107/62 and her pulse is 86. Her respiration is 20. Marland Kitchen No palpable supraclavicular or axillary adenopathy. The lungs are clear to auscultation. The heart has regular rhythm and rate. Examination of the right breast reveals no mass or nipple discharge. Examination of the left breast reveals some mild hyperpigmentation changes. She continues to be  some edema in the breast. No dominant masses appreciated breast nipple discharge or bleeding. She does have some scar tissue along her axillary scar.  Lab Findings: Lab Results  Component Value Date   WBC 5.1 10/16/2013   HGB 12.9 10/16/2013   HCT 38.6 10/16/2013   MCV 92.3 10/16/2013   PLT 174 10/16/2013      Radiographic Findings: No results found.  Impression:  No evidence of recurrence on clinical exam today. I have recommended gentle massaging of the left breast to help with edema and scar tissue issues.  Plan:  When necessary followup in radiation oncology. Patient will continue close followup in medical oncology and is scheduled for yearly followup with Gen. surgery in August.  ____________________________________ Blair Promise, MD

## 2014-01-28 ENCOUNTER — Emergency Department (HOSPITAL_COMMUNITY)
Admission: EM | Admit: 2014-01-28 | Discharge: 2014-01-28 | Disposition: A | Discharge status: Home / Self Care | Payer: BC Managed Care – PPO | Attending: Emergency Medicine | Admitting: Emergency Medicine

## 2014-01-28 ENCOUNTER — Encounter (HOSPITAL_COMMUNITY): Payer: Self-pay | Admitting: Emergency Medicine

## 2014-01-28 ENCOUNTER — Emergency Department (HOSPITAL_COMMUNITY): Payer: BC Managed Care – PPO

## 2014-01-28 DIAGNOSIS — Z79899 Other long term (current) drug therapy: Secondary | ICD-10-CM | POA: Diagnosis not present

## 2014-01-28 DIAGNOSIS — R748 Abnormal levels of other serum enzymes: Secondary | ICD-10-CM

## 2014-01-28 DIAGNOSIS — M810 Age-related osteoporosis without current pathological fracture: Secondary | ICD-10-CM | POA: Insufficient documentation

## 2014-01-28 DIAGNOSIS — Z791 Long term (current) use of non-steroidal anti-inflammatories (NSAID): Secondary | ICD-10-CM | POA: Diagnosis not present

## 2014-01-28 DIAGNOSIS — Z923 Personal history of irradiation: Secondary | ICD-10-CM | POA: Insufficient documentation

## 2014-01-28 DIAGNOSIS — Z853 Personal history of malignant neoplasm of breast: Secondary | ICD-10-CM | POA: Diagnosis not present

## 2014-01-28 DIAGNOSIS — Z8639 Personal history of other endocrine, nutritional and metabolic disease: Secondary | ICD-10-CM | POA: Insufficient documentation

## 2014-01-28 DIAGNOSIS — R109 Unspecified abdominal pain: Secondary | ICD-10-CM | POA: Insufficient documentation

## 2014-01-28 DIAGNOSIS — Z8679 Personal history of other diseases of the circulatory system: Secondary | ICD-10-CM | POA: Diagnosis not present

## 2014-01-28 DIAGNOSIS — Z862 Personal history of diseases of the blood and blood-forming organs and certain disorders involving the immune mechanism: Secondary | ICD-10-CM | POA: Diagnosis not present

## 2014-01-28 DIAGNOSIS — M129 Arthropathy, unspecified: Secondary | ICD-10-CM | POA: Insufficient documentation

## 2014-01-28 DIAGNOSIS — Z8781 Personal history of (healed) traumatic fracture: Secondary | ICD-10-CM | POA: Diagnosis not present

## 2014-01-28 LAB — HEPATIC FUNCTION PANEL
ALT: 12 U/L (ref 0–35)
AST: 15 U/L (ref 0–37)
Albumin: 3.9 g/dL (ref 3.5–5.2)
Alkaline Phosphatase: 69 U/L (ref 39–117)
Bilirubin, Direct: <0.2 mg/dL (ref 0.0–0.3)
TOTAL PROTEIN: 7 g/dL (ref 6.0–8.3)
Total Bilirubin: 0.4 mg/dL (ref 0.3–1.2)

## 2014-01-28 LAB — URINALYSIS, ROUTINE W REFLEX MICROSCOPIC
Bilirubin Urine: NEGATIVE
Glucose, UA: NEGATIVE mg/dL
HGB URINE DIPSTICK: NEGATIVE
Leukocytes, UA: NEGATIVE
Nitrite: NEGATIVE
PROTEIN: NEGATIVE mg/dL
Specific Gravity, Urine: 1.025 (ref 1.005–1.030)
UROBILINOGEN UA: 0.2 mg/dL (ref 0.0–1.0)
pH: 6 (ref 5.0–8.0)

## 2014-01-28 LAB — CBC WITH DIFFERENTIAL/PLATELET
BASOS ABS: 0 10*3/uL (ref 0.0–0.1)
Basophils Relative: 0 % (ref 0–1)
Eosinophils Absolute: 0.2 10*3/uL (ref 0.0–0.7)
Eosinophils Relative: 3 % (ref 0–5)
HCT: 41 % (ref 36.0–46.0)
Hemoglobin: 13.8 g/dL (ref 12.0–15.0)
Lymphocytes Relative: 16 % (ref 12–46)
Lymphs Abs: 1 10*3/uL (ref 0.7–4.0)
MCH: 30.5 pg (ref 26.0–34.0)
MCHC: 33.7 g/dL (ref 30.0–36.0)
MCV: 90.7 fL (ref 78.0–100.0)
Monocytes Absolute: 0.3 10*3/uL (ref 0.1–1.0)
Monocytes Relative: 6 % (ref 3–12)
Neutro Abs: 4.6 10*3/uL (ref 1.7–7.7)
Neutrophils Relative %: 75 % (ref 43–77)
Platelets: 180 10*3/uL (ref 150–400)
RBC: 4.52 MIL/uL (ref 3.87–5.11)
RDW: 12.7 % (ref 11.5–15.5)
WBC: 6.1 10*3/uL (ref 4.0–10.5)

## 2014-01-28 LAB — BASIC METABOLIC PANEL
Anion gap: 10 (ref 5–15)
BUN: 10 mg/dL (ref 6–23)
CALCIUM: 8.8 mg/dL (ref 8.4–10.5)
CO2: 28 mEq/L (ref 19–32)
Chloride: 100 mEq/L (ref 96–112)
Creatinine, Ser: 0.86 mg/dL (ref 0.50–1.10)
GFR calc Af Amer: 87 mL/min — ABNORMAL LOW (ref >90)
GFR calc non Af Amer: 75 mL/min — ABNORMAL LOW (ref >90)
GLUCOSE: 121 mg/dL — AB (ref 70–99)
Potassium: 3.9 mEq/L (ref 3.7–5.3)
Sodium: 138 mEq/L (ref 137–147)

## 2014-01-28 LAB — LIPASE, BLOOD: LIPASE: 88 U/L — AB (ref 11–59)

## 2014-01-28 MED ORDER — TRAMADOL HCL 50 MG PO TABS: 50.0000 mg | ORAL_TABLET | Freq: Four times a day (QID) | ORAL | Status: DC | PRN

## 2014-01-28 MED ORDER — PROMETHAZINE HCL 25 MG PO TABS: 25.0000 mg | ORAL_TABLET | Freq: Four times a day (QID) | ORAL | Status: DC | PRN

## 2014-01-28 MED ORDER — IOHEXOL 300 MG/ML  SOLN
100.0000 mL | Freq: Once | INTRAMUSCULAR | Status: AC | PRN
  Administered 2014-01-28: 100 mL via INTRAVENOUS

## 2014-01-28 MED ORDER — MORPHINE SULFATE 2 MG/ML IJ SOLN
INTRAMUSCULAR | Status: AC
  Filled 2014-01-28: qty 1

## 2014-01-28 MED ORDER — SODIUM CHLORIDE 0.9 % IV BOLUS (SEPSIS)
1000.0000 mL | Freq: Once | INTRAVENOUS | Status: AC
  Administered 2014-01-28: 1000 mL via INTRAVENOUS

## 2014-01-28 MED ORDER — IOHEXOL 300 MG/ML  SOLN
50.0000 mL | Freq: Once | INTRAMUSCULAR | Status: AC | PRN
  Administered 2014-01-28: 50 mL via ORAL

## 2014-01-28 MED ORDER — MORPHINE SULFATE 2 MG/ML IJ SOLN
2.0000 mg | Freq: Once | INTRAMUSCULAR | Status: AC
  Administered 2014-01-28: 2 mg via INTRAVENOUS

## 2014-01-28 NOTE — ED Notes (Signed)
Pt c/o lower abdomen that radiates around to her back that started around 10:30 am. Pt denies any urinary symptoms or n/v.

## 2014-01-28 NOTE — ED Provider Notes (Signed)
CSN: [id number]     Arrival date & time 01/28/14  4650 History  This chart was scribed for Nat Christen, MD, by Neta Ehlers, ED Scribe. This patient was seen in room APA03/APA03 and the patient's care was started at 8:40 PM.   First MD Initiated Contact with Patient 01/28/14 2025     Chief Complaint  Patient presents with  . Abdominal Pain    The history is provided by the patient and the spouse. No language interpreter was used.   HPI Comments: Doris Bender is a 53 y.o. female who presents to the Emergency Department complaining of generalized mid-abdominal pain which radiates to her left flank and which began ten hours ago. [name] rates the pain as 7/10 and she reports the pain is increased with standing. She denies a h/o similar symptoms. She also denies nausea, emesis, dysuria, vaginal bleeding, vaginal discharge, chills, or a fever; in the ED her temperature is 98.3 F. She endorses a  decreased appetite, but she is able to tolerate solids. Her last BM was yesterday. The pt denies a h/o abdominal surgeries. She endorses a h/o L4 fracture. She also endorses a h/o breast cancer which was diagnosed June 2014; it was treated with surgery and radiation; it is currently in remission. She denies HTN or DM.   Dr. Rory Percy in [place], [place] is her PCP.   Past Medical History  Diagnosis Date  . No pertinent past medical history   . Contact lens/glasses fitting     wears contacts or glasses  . Wears partial dentures     partial lower  . Cancer     breast  . Compression fracture of L4 lumbar vertebra 11/04/2012    injury  . Raynaud's disease     bilateral hands and feet  . Arthritis     lt great toe (RA)  . Osteoporosis   . Pseudogout of joint of left foot 6 yrs ago  . Gout 8-9 yrs ago    Rt heel/foot  . Breast cancer 11/15/12    left  . History of radiation therapy 02/20/13-04/07/13    left breast 50.4 gray, upper outer quadrant boosted to 62.4 gray   Past Surgical History   Procedure Laterality Date  . Wrist ganglion excision      left x2  . Tonsillectomy    . Knee arthroscopy  2006    right  . Cervical conization w/bx  2012    benign  . Lateral epicondyle release  03/08/2012    Procedure: TENNIS ELBOW RELEASE;  Surgeon: Cammie Sickle., MD;  Location: [facility];  Service: Orthopedics;  Laterality: Right;  RECONSTRUCTION OF RIGHT LATERAL ELBOW WITH TENDONS  . Mass excision  03/08/2012    Procedure: EXCISION MASS;  Surgeon: Cammie Sickle., MD;  Location: [facility];  Service: Orthopedics;  Laterality: Right;  EXCISION OF RIGHT WRIST DORSAL CYST  . Tonsillectomy      53yrs old  . Breast surgery  age 43    left breast bx- benign  . Breast lumpectomy with needle localization and axillary sentinel lymph node bx Left 12/21/2012    Procedure: BREAST LUMPECTOMY WITH NEEDLE LOCALIZATION AND AXILLARY SENTINEL LYMPH NODE BX;  Surgeon: Haywood Lasso, MD;  Location: [facility];  Service: General;  Laterality: Left;  needle local BCG     Family History  Problem Relation Age of Onset  . Hyperthyroidism Mother   . Cirrhosis Father   .  Diabetes Brother   . Heart attack Maternal Grandmother   . Heart attack Maternal Grandfather    History  Substance Use Topics  . Smoking status: Current Every Day Smoker -- 1.00 packs/day for 20 years  . Smokeless tobacco: Never Used     Comment: 1ppd or less  . Alcohol Use: Yes     Comment: rare   No OB history provided.   Review of Systems  A complete 10 system review of systems was obtained, and all systems were negative except where indicated in the HPI and PE.    Allergies  Ibuprofen; Keflex; Ketorolac; Lodine; Oxycodone; and Robaxin  Home Medications   Prior to Admission medications   Medication Sig Start Date End Date Taking? Authorizing Provider  ALPRAZolam Duanne Moron) 0.25 MG tablet Take 0.25 mg by mouth at bedtime as needed for sleep.   Yes Historical  Provider, MD  denosumab (PROLIA) 60 MG/ML SOLN injection Inject 60 mg into the skin every 6 (six) months. Administer in upper arm, thigh, or abdomen   Yes Historical Provider, MD  HYDROcodone-acetaminophen (NORCO/VICODIN) 5-325 MG per tablet Take 1 tablet by mouth every 6 (six) hours as needed for moderate pain.   Yes Historical Provider, MD  naproxen sodium (ALEVE) 220 MG tablet Take 220 mg by mouth 2 (two) times daily as needed (Pain).   Yes Historical Provider, MD  promethazine (PHENERGAN) 25 MG tablet Take 1 tablet (25 mg total) by mouth every 6 (six) hours as needed. 01/28/14   Nat Christen, MD  traMADol (ULTRAM) 50 MG tablet Take 1 tablet (50 mg total) by mouth every 6 (six) hours as needed. 01/28/14   Nat Christen, MD   Triage Vitals: BP 122/64  Pulse 85  Temp(Src) 98.3 F (36.8 C) (Oral)  Resp 20  Ht 5' 5.5" (1.664 m)  Wt 132 lb (59.875 kg)  BMI 21.62 kg/m2  SpO2 98%  Physical Exam  Nursing note and vitals reviewed. Constitutional: She is oriented to person, place, and time. She appears well-developed and well-nourished.  HENT:  Head: Normocephalic and atraumatic.  Eyes: Conjunctivae and EOM are normal. Pupils are equal, round, and reactive to light.  Neck: Normal range of motion. Neck supple.  Cardiovascular: Normal rate, regular rhythm and normal heart sounds.   Pulmonary/Chest: Effort normal and breath sounds normal.  Abdominal: Soft. Bowel sounds are normal. There is tenderness.  More tender to left side of abdomen. Minimally tender to left flank.   Musculoskeletal: Normal range of motion.  Neurological: She is alert and oriented to person, place, and time.  Skin: Skin is warm and dry.  Psychiatric: She has a normal mood and affect. Her behavior is normal.    ED Course  Procedures (including critical care time)  DIAGNOSTIC STUDIES: Oxygen Saturation is 98% on room air, normal by my interpretation.    COORDINATION OF CARE:  8:47 PM- Discussed treatment plan with  patient, and the patient agreed to the plan. The plan includes a CT scan.   Results for orders placed during the hospital encounter of 01/28/14  URINALYSIS, ROUTINE W REFLEX MICROSCOPIC      Result Value Ref Range   Color, Urine YELLOW  YELLOW   APPearance CLEAR  CLEAR   Specific Gravity, Urine 1.025  1.005 - 1.030   pH 6.0  5.0 - 8.0   Glucose, UA NEGATIVE  NEGATIVE mg/dL   Hgb urine dipstick NEGATIVE  NEGATIVE   Bilirubin Urine NEGATIVE  NEGATIVE   Ketones, ur TRACE (*) NEGATIVE  mg/dL   Protein, ur NEGATIVE  NEGATIVE mg/dL   Urobilinogen, UA 0.2  0.0 - 1.0 mg/dL   Nitrite NEGATIVE  NEGATIVE   Leukocytes, UA NEGATIVE  NEGATIVE  BASIC METABOLIC PANEL      Result Value Ref Range   Sodium 138  137 - 147 mEq/L   Potassium 3.9  3.7 - 5.3 mEq/L   Chloride 100  96 - 112 mEq/L   CO2 28  19 - 32 mEq/L   Glucose, Bld 121 (*) 70 - 99 mg/dL   BUN 10  6 - 23 mg/dL   Creatinine, Ser 0.86  0.50 - 1.10 mg/dL   Calcium 8.8  8.4 - 10.5 mg/dL   GFR calc non Af Amer 75 (*) >90 mL/min   GFR calc Af Amer 87 (*) >90 mL/min   Anion gap 10  5 - 15  CBC WITH DIFFERENTIAL      Result Value Ref Range   WBC 6.1  4.0 - 10.5 K/uL   RBC 4.52  3.87 - 5.11 MIL/uL   Hemoglobin 13.8  12.0 - 15.0 g/dL   HCT 41.0  36.0 - 46.0 %   MCV 90.7  78.0 - 100.0 fL   MCH 30.5  26.0 - 34.0 pg   MCHC 33.7  30.0 - 36.0 g/dL   RDW 12.7  11.5 - 15.5 %   Platelets 180  150 - 400 K/uL   Neutrophils Relative % 75  43 - 77 %   Neutro Abs 4.6  1.7 - 7.7 K/uL   Lymphocytes Relative 16  12 - 46 %   Lymphs Abs 1.0  0.7 - 4.0 K/uL   Monocytes Relative 6  3 - 12 %   Monocytes Absolute 0.3  0.1 - 1.0 K/uL   Eosinophils Relative 3  0 - 5 %   Eosinophils Absolute 0.2  0.0 - 0.7 K/uL   Basophils Relative 0  0 - 1 %   Basophils Absolute 0.0  0.0 - 0.1 K/uL  HEPATIC FUNCTION PANEL      Result Value Ref Range   Total Protein 7.0  6.0 - 8.3 g/dL   Albumin 3.9  3.5 - 5.2 g/dL   AST 15  0 - 37 U/L   ALT 12  0 - 35 U/L   Alkaline  Phosphatase 69  39 - 117 U/L   Total Bilirubin 0.4  0.3 - 1.2 mg/dL   Bilirubin, Direct <0.2  0.0 - 0.3 mg/dL   Indirect Bilirubin NOT CALCULATED  0.3 - 0.9 mg/dL  LIPASE, BLOOD      Result Value Ref Range   Lipase 88 (*) 11 - 59 U/L    Imaging Review Ct Abdomen Pelvis W Contrast  01/28/2014   CLINICAL DATA:  Bilateral mid abdominal pain radiating to the left side for 12 hours.  EXAM: CT ABDOMEN AND PELVIS WITH CONTRAST  TECHNIQUE: Multidetector CT imaging of the abdomen and pelvis was performed using the standard protocol following bolus administration of intravenous contrast.  CONTRAST:  149mL OMNIPAQUE IOHEXOL 300 MG/ML SOLN, 25mL OMNIPAQUE IOHEXOL 300 MG/ML SOLN  COMPARISON:  None.  FINDINGS: The lung bases are clear.  The liver, spleen, gallbladder, pancreas, adrenal glands, kidneys, abdominal aorta, inferior vena cava, and retroperitoneal lymph nodes are unremarkable. Retro aortic left renal vein. Small accessory spleen. Stomach and small bowel are not abnormally distended. Gas and stool within the colon. No colonic distention or wall thickening. Descending colon is decompressed. No free air or free fluid  in the abdomen. Abdominal wall musculature appears intact.  Pelvis: Uterus and ovaries are not enlarged. No pelvic mass or lymphadenopathy. Bladder wall is not thickened. No evidence of diverticulitis. Appendix is normal. No inflammatory changes in the right lower quadrant. Compression fracture of the L4 vertebrae superiorly. This was present on previous MRI from 08/17/2013. No destructive bone lesions.  IMPRESSION: No focal acute abnormality is identified to account for the patient's pain.   Electronically Signed   By: Lucienne Capers M.D.   On: 01/28/2014 22:16     EKG Interpretation None      MDM   Final diagnoses:  Elevated lipase  Abdominal pain, unspecified abdominal location    No acute abdomen. Lipase noted to be slightly elevated at 88. However patient is nontender over the  epigastrium. CT scan of abdomen and pelvis show no gross abnormalities. These findings were discussed with patient and her husband. Discharge medication tramadol and Phenergan 25 mg. She has primary care followup.  I personally performed the services described in this documentation, which was scribed in my presence. The recorded information has been reviewed and is accurate.    Nat Christen, MD 01/29/14 [phone]

## 2014-01-28 NOTE — Discharge Instructions (Signed)
Followup your primary care Dr. this week.   Liquids for next 12 hours.    Prescription for pain and nausea medicine.   Tests ordered today include a CBC, chemistry panel, urinalysis, CT scan of abdome/ pelvis. The only abnormality noted was a lipase of 88  . Be sure to share this information with your Dr.

## 2014-02-14 ENCOUNTER — Other Ambulatory Visit: Payer: Self-pay

## 2014-02-14 DIAGNOSIS — C50912 Malignant neoplasm of unspecified site of left female breast: Secondary | ICD-10-CM

## 2014-02-15 ENCOUNTER — Ambulatory Visit (HOSPITAL_BASED_OUTPATIENT_CLINIC_OR_DEPARTMENT_OTHER): Payer: BC Managed Care – PPO | Admitting: Hematology and Oncology

## 2014-02-15 ENCOUNTER — Other Ambulatory Visit (HOSPITAL_BASED_OUTPATIENT_CLINIC_OR_DEPARTMENT_OTHER): Payer: BC Managed Care – PPO

## 2014-02-15 VITALS — BP 108/57 | HR 88 | Temp 98.5°F | Resp 18 | Ht 65.5 in | Wt 131.4 lb

## 2014-02-15 DIAGNOSIS — C50912 Malignant neoplasm of unspecified site of left female breast: Secondary | ICD-10-CM

## 2014-02-15 DIAGNOSIS — M810 Age-related osteoporosis without current pathological fracture: Secondary | ICD-10-CM

## 2014-02-15 DIAGNOSIS — C50412 Malignant neoplasm of upper-outer quadrant of left female breast: Secondary | ICD-10-CM

## 2014-02-15 LAB — CBC WITH DIFFERENTIAL/PLATELET
BASO%: 1 % (ref 0.0–2.0)
BASOS ABS: 0 10*3/uL (ref 0.0–0.1)
EOS%: 4.1 % (ref 0.0–7.0)
Eosinophils Absolute: 0.2 10*3/uL (ref 0.0–0.5)
HCT: 40 % (ref 34.8–46.6)
HGB: 13.1 g/dL (ref 11.6–15.9)
LYMPH%: 22.9 % (ref 14.0–49.7)
MCH: 30 pg (ref 25.1–34.0)
MCHC: 32.8 g/dL (ref 31.5–36.0)
MCV: 91.5 fL (ref 79.5–101.0)
MONO#: 0.3 10*3/uL (ref 0.1–0.9)
MONO%: 5.5 % (ref 0.0–14.0)
NEUT%: 66.5 % (ref 38.4–76.8)
NEUTROS ABS: 3.2 10*3/uL (ref 1.5–6.5)
Platelets: 187 10*3/uL (ref 145–400)
RBC: 4.37 10*6/uL (ref 3.70–5.45)
RDW: 13.1 % (ref 11.2–14.5)
WBC: 4.8 10*3/uL (ref 3.9–10.3)
lymph#: 1.1 10*3/uL (ref 0.9–3.3)

## 2014-02-15 LAB — COMPREHENSIVE METABOLIC PANEL (CC13)
ALBUMIN: 3.7 g/dL (ref 3.5–5.0)
ALT: 11 U/L (ref 0–55)
ANION GAP: 8 meq/L (ref 3–11)
AST: 11 U/L (ref 5–34)
Alkaline Phosphatase: 68 U/L (ref 40–150)
BUN: 7.8 mg/dL (ref 7.0–26.0)
CALCIUM: 9 mg/dL (ref 8.4–10.4)
CHLORIDE: 107 meq/L (ref 98–109)
CO2: 28 meq/L (ref 22–29)
Creatinine: 1 mg/dL (ref 0.6–1.1)
GLUCOSE: 170 mg/dL — AB (ref 70–140)
POTASSIUM: 3.9 meq/L (ref 3.5–5.1)
SODIUM: 142 meq/L (ref 136–145)
TOTAL PROTEIN: 6.3 g/dL — AB (ref 6.4–8.3)
Total Bilirubin: 0.35 mg/dL (ref 0.20–1.20)

## 2014-02-15 NOTE — Assessment & Plan Note (Signed)
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 Final pathology revealed a 0.7 cm low-grade invasive ductal carcinoma that was ER positive PR negative with a low Ki-67 of 12%. Status post radiation therapy but could not tolerate antiestrogen therapy in spite of trying other medications.  Surveillance: Recent mammograms were apparently normal I do not have a copy of the report. Breast exams have been normal as well.  Survivorship: I instructed her that she needs to continue with her exercise regimen and stay active. I discussed her lab work with her and her sugars are high 170. Patient will watch her diet.  Return to clinic in 6 months for followup.

## 2014-02-15 NOTE — Progress Notes (Signed)
Patient Care Team: Rory Percy, MD as PCP - General (Family Medicine) Haywood Lasso, MD as Attending Physician (General Surgery) Gari Crown, MD (Gynecology)  DIAGNOSIS: Stage I left breast cancer.  STAGE: Left breast T1 N0 ER positive, PR negative, Ki-6712%  CHIEF COMPLIANT: Followup of breast cancer  INTERVAL HISTORY: Doris Bender is a 53 year old Caucasian with above-mentioned history of stage I breast cancer who was treated with lumpectomy radiation. She could not tolerate antiestrogen therapy. She is currently undergoing surveillance. She denies any problems or concerns. She has problems with rheumatoid arthritis. She received Prolia for osteoporosis and developed a rash on her belly. She does not want a Prolia anymore.  REVIEW OF SYSTEMS:   Constitutional: Denies fevers, chills or abnormal weight loss Eyes: Denies blurriness of vision Ears, nose, mouth, throat, and face: Denies mucositis or sore throat Respiratory: Denies cough, dyspnea or wheezes Cardiovascular: Denies palpitation, chest discomfort or lower extremity swelling Gastrointestinal:  Denies nausea, heartburn or change in bowel habits Skin: Rash on belly Lymphatics: Denies new lymphadenopathy or easy bruising Neurological:Denies numbness, tingling or new weaknesses Behavioral/Psych: Mood is stable, no new changes  Breast:  denies any pain or lumps or nodules in either breasts All other systems were reviewed with the patient and are negative.  I have reviewed the past medical history, past surgical history, social history and family history with the patient and they are unchanged from previous note.  ALLERGIES:  is allergic to ibuprofen; keflex; ketorolac; lodine; oxycodone; and robaxin.  MEDICATIONS:  Current Outpatient Prescriptions  Medication Sig Dispense Refill  . ALPRAZolam (XANAX) 0.25 MG tablet Take 0.25 mg by mouth at bedtime as needed for sleep.       No current facility-administered medications  for this visit.    PHYSICAL EXAMINATION: ECOG PERFORMANCE STATUS: 0 - Asymptomatic  Filed Vitals:   02/15/14 1554  BP: 108/57  Pulse: 88  Temp: 98.5 F (36.9 C)  Resp: 18   Filed Weights   02/15/14 1554  Weight: 131 lb 6.4 oz (59.603 kg)    GENERAL:alert, no distress and comfortable SKIN: skin color, texture, turgor are normal, no rashes or significant lesions EYES: normal, Conjunctiva are pink and non-injected, sclera clear OROPHARYNX:no exudate, no erythema and lips, buccal mucosa, and tongue normal  NECK: supple, thyroid normal size, non-tender, without nodularity LYMPH:  no palpable lymphadenopathy in the cervical, axillary or inguinal LUNGS: clear to auscultation and percussion with normal breathing effort HEART: regular rate & rhythm and no murmurs and no lower extremity edema ABDOMEN:abdomen soft, non-tender and normal bowel sounds Musculoskeletal:no cyanosis of digits and no clubbing  NEURO: alert & oriented x 3 with fluent speech, no focal motor/sensory deficits   LABORATORY DATA:  I have reviewed the data as listed   Chemistry      Component Value Date/Time   NA 142 02/15/2014 1545   NA 138 01/28/2014 2005   K 3.9 02/15/2014 1545   K 3.9 01/28/2014 2005   CL 100 01/28/2014 2005   CO2 28 02/15/2014 1545   CO2 28 01/28/2014 2005   BUN 7.8 02/15/2014 1545   BUN 10 01/28/2014 2005   CREATININE 1.0 02/15/2014 1545   CREATININE 0.86 01/28/2014 2005      Component Value Date/Time   CALCIUM 9.0 02/15/2014 1545   CALCIUM 8.8 01/28/2014 2005   ALKPHOS 68 02/15/2014 1545   ALKPHOS 69 01/28/2014 2005   AST 11 02/15/2014 1545   AST 15 01/28/2014 2005   ALT 11 02/15/2014 1545  ALT 12 01/28/2014 2005   BILITOT 0.35 02/15/2014 1545   BILITOT 0.4 01/28/2014 2005       Lab Results  Component Value Date   WBC 4.8 02/15/2014   HGB 13.1 02/15/2014   HCT 40.0 02/15/2014   MCV 91.5 02/15/2014   PLT 187 02/15/2014   NEUTROABS 3.2 02/15/2014     RADIOGRAPHIC  STUDIES: I have personally reviewed the radiology reports and agreed with their findings. No results found.   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 Final pathology revealed a 0.7 cm low-grade invasive ductal carcinoma that was ER positive PR negative with a low Ki-67 of 12%. Status post radiation therapy but could not tolerate antiestrogen therapy in spite of trying other medications.  Surveillance: Recent mammograms were apparently normal I do not have a copy of the report. Breast exams have been normal as well.  Survivorship: I instructed her that she needs to continue with her exercise regimen and stay active. I discussed her lab work with her and her sugars are high 170. Patient will watch her diet.  Return to clinic in 6 months for followup.    Orders Placed This Encounter  Procedures  . CBC with Differential    Standing Status: Future     Number of Occurrences:      Standing Expiration Date: 02/15/2015  . Comprehensive metabolic panel (Cmet) - [facility]    Standing Status: Future     Number of Occurrences:      Standing Expiration Date: 02/15/2015   The patient has a good understanding of the overall plan. she agrees with it. She will call with any problems that may develop before her next visit here.  I spent 15 minutes counseling the patient face to face. The total time spent in the appointment was 20 minutes and more than 50% was on counseling and review of test results    Rulon Eisenmenger, MD 02/15/2014 4:39 PM

## 2014-02-16 ENCOUNTER — Telehealth: Payer: Self-pay | Admitting: Hematology and Oncology

## 2014-02-16 NOTE — Telephone Encounter (Signed)
lvm for pt regarding to April 2016 appt....mailed pt appt sched/avs and letter °

## 2014-02-19 ENCOUNTER — Telehealth: Payer: Self-pay

## 2014-02-19 NOTE — Telephone Encounter (Signed)
Contacted [facility] - no mammo.  Per pt - mammogram done at [facility].  Copy of mammogram requested.

## 2014-02-19 NOTE — Telephone Encounter (Signed)
Mammogram rcvd from [facility].  Copy to Dr Lindi Adie.  Original to scan.

## 2014-08-08 ENCOUNTER — Telehealth: Payer: Self-pay | Admitting: Hematology and Oncology

## 2014-08-08 ENCOUNTER — Other Ambulatory Visit (HOSPITAL_BASED_OUTPATIENT_CLINIC_OR_DEPARTMENT_OTHER): Payer: BLUE CROSS/BLUE SHIELD

## 2014-08-08 ENCOUNTER — Ambulatory Visit (HOSPITAL_BASED_OUTPATIENT_CLINIC_OR_DEPARTMENT_OTHER): Payer: BLUE CROSS/BLUE SHIELD | Admitting: Hematology and Oncology

## 2014-08-08 VITALS — BP 105/72 | HR 84 | Temp 97.9°F | Resp 18 | Ht 65.5 in | Wt 131.6 lb

## 2014-08-08 DIAGNOSIS — Z853 Personal history of malignant neoplasm of breast: Secondary | ICD-10-CM

## 2014-08-08 DIAGNOSIS — C50912 Malignant neoplasm of unspecified site of left female breast: Secondary | ICD-10-CM

## 2014-08-08 DIAGNOSIS — R630 Anorexia: Secondary | ICD-10-CM | POA: Diagnosis not present

## 2014-08-08 DIAGNOSIS — R5383 Other fatigue: Secondary | ICD-10-CM

## 2014-08-08 LAB — COMPREHENSIVE METABOLIC PANEL (CC13)
ALT: 11 U/L (ref 0–55)
AST: 13 U/L (ref 5–34)
Albumin: 3.9 g/dL (ref 3.5–5.0)
Alkaline Phosphatase: 66 U/L (ref 40–150)
Anion Gap: 10 mEq/L (ref 3–11)
BILIRUBIN TOTAL: 0.45 mg/dL (ref 0.20–1.20)
BUN: 9.1 mg/dL (ref 7.0–26.0)
CHLORIDE: 106 meq/L (ref 98–109)
CO2: 27 meq/L (ref 22–29)
Calcium: 9.1 mg/dL (ref 8.4–10.4)
Creatinine: 0.9 mg/dL (ref 0.6–1.1)
EGFR: 75 mL/min/{1.73_m2} — ABNORMAL LOW (ref >90)
GLUCOSE: 143 mg/dL — AB (ref 70–140)
Potassium: 4.6 mEq/L (ref 3.5–5.1)
SODIUM: 143 meq/L (ref 136–145)
TOTAL PROTEIN: 6.3 g/dL — AB (ref 6.4–8.3)

## 2014-08-08 LAB — CBC WITH DIFFERENTIAL/PLATELET
BASO%: 0.6 % (ref 0.0–2.0)
BASOS ABS: 0 10*3/uL (ref 0.0–0.1)
EOS%: 3.1 % (ref 0.0–7.0)
Eosinophils Absolute: 0.1 10*3/uL (ref 0.0–0.5)
HCT: 39.3 % (ref 34.8–46.6)
HEMOGLOBIN: 12.8 g/dL (ref 11.6–15.9)
LYMPH#: 1 10*3/uL (ref 0.9–3.3)
LYMPH%: 21.1 % (ref 14.0–49.7)
MCH: 29.2 pg (ref 25.1–34.0)
MCHC: 32.7 g/dL (ref 31.5–36.0)
MCV: 89.4 fL (ref 79.5–101.0)
MONO#: 0.2 10*3/uL (ref 0.1–0.9)
MONO%: 4.9 % (ref 0.0–14.0)
NEUT#: 3.3 10*3/uL (ref 1.5–6.5)
NEUT%: 70.3 % (ref 38.4–76.8)
Platelets: 173 10*3/uL (ref 145–400)
RBC: 4.4 10*6/uL (ref 3.70–5.45)
RDW: 13 % (ref 11.2–14.5)
WBC: 4.7 10*3/uL (ref 3.9–10.3)

## 2014-08-08 NOTE — Telephone Encounter (Signed)
per pof to sch pt appt-gave pt copy of sch °

## 2014-08-08 NOTE — Assessment & Plan Note (Signed)
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 Final pathology revealed a 0.7 cm low-grade invasive ductal carcinoma that was ER positive PR negative with a low Ki-67 of 12%. Status post radiation therapy but could not tolerate antiestrogen therapy in spite of trying other medications.  Breast cancer surveillance: 1. Breast exam 08/08/2014 is normal 2. Mammogram 10/24/2013 is normal  Survivorship:Discussed the importance of physical exercise in decreasing the likelihood of breast cancer recurrence. Recommended 30 mins daily 6 days a week of either brisk walking or cycling or swimming. Encouraged patient to eat more fruits and vegetables and decrease red meat.   Return to clinic in 6 months for surveillance and follow-up.

## 2014-08-08 NOTE — Progress Notes (Signed)
Patient Care Team: Rory Percy, MD as PCP - General (Family Medicine) Neldon Mc, MD as Attending Physician (General Surgery) Gari Crown, MD (Gynecology)  DDIAGNOSIS: Stage I left breast cancer.  STAGE: Left breast T1 N0 ER positive, PR negative, Ki-6712%  CHIEF COMPLIANT: Followup of breast cancer  INTERVAL HISTORY: Doris Bender is a 54 year old with above-mentioned history of stage I left breast cancer currently on observation. She is does not have any new problems or concerns other than mild fatigue and decreased appetite. Some days she cannot sleep and she has to take a Xanax on for 5 days every week. Denies any lumps or nodules in the breasts.  REVIEW OF SYSTEMS:   Constitutional: Denies fevers, chills or abnormal weight loss Eyes: Denies blurriness of vision Ears, nose, mouth, throat, and face: Denies mucositis or sore throat Respiratory: Denies cough, dyspnea or wheezes Cardiovascular: Denies palpitation, chest discomfort or lower extremity swelling Gastrointestinal:  Denies nausea, heartburn or change in bowel habits Skin: Denies abnormal skin rashes. She is intake sheet okay she had that eye which he takes she is going to do some math( Lymphatics: Denies new lymphadenopathy or easy bruising Neurological:Denies numbness, tingling or new weaknesses Behavioral/Psych: Mood is stable, no new changes  Breast:  denies any pain or lumps or nodules in either breasts All other systems were reviewed with the patient and are negative.  I have reviewed the past medical history, past surgical history, social history and family history with the patient and they are unchanged from previous note.  ALLERGIES:  is allergic to ibuprofen; keflex; ketorolac; lodine; oxycodone; and robaxin.  MEDICATIONS:  Current Outpatient Prescriptions  Medication Sig Dispense Refill  . ALPRAZolam (XANAX) 0.25 MG tablet Take 0.25 mg by mouth at bedtime as needed for sleep.     No current  facility-administered medications for this visit.    PHYSICAL EXAMINATION: ECOG PERFORMANCE STATUS: 0 - Asymptomatic  Filed Vitals:   08/08/14 1537  BP: 105/72  Pulse: 84  Temp: 97.9 F (36.6 C)  Resp: 18   Filed Weights   08/08/14 1537  Weight: 131 lb 9.6 oz (59.693 kg)    GENERAL:alert, no distress and comfortable SKIN: skin color, texture, turgor are normal, no rashes or significant lesions EYES: normal, Conjunctiva are pink and non-injected, sclera clear OROPHARYNX:no exudate, no erythema and lips, buccal mucosa, and tongue normal  NECK: supple, thyroid normal size, non-tender, without nodularity LYMPH:  no palpable lymphadenopathy in the cervical, axillary or inguinal LUNGS: clear to auscultation and percussion with normal breathing effort HEART: regular rate & rhythm and no murmurs and no lower extremity edema ABDOMEN:abdomen soft, non-tender and normal bowel sounds Musculoskeletal:no cyanosis of digits and no clubbing  NEURO: alert & oriented x 3 with fluent speech, no focal motor/sensory deficits BREAST: No palpable masses or nodules in either right or left breasts. No palpable axillary supraclavicular or infraclavicular adenopathy no breast tenderness or nipple discharge. (exam performed in the presence of a chaperone)  LABORATORY DATA:  I have reviewed the data as listed   Chemistry      Component Value Date/Time   NA 143 08/08/2014 1450   NA 138 01/28/2014 2005   K 4.6 08/08/2014 1450   K 3.9 01/28/2014 2005   CL 100 01/28/2014 2005   CO2 27 08/08/2014 1450   CO2 28 01/28/2014 2005   BUN 9.1 08/08/2014 1450   BUN 10 01/28/2014 2005   CREATININE 0.9 08/08/2014 1450   CREATININE 0.86 01/28/2014 2005  Component Value Date/Time   CALCIUM 9.1 08/08/2014 1450   CALCIUM 8.8 01/28/2014 2005   ALKPHOS 66 08/08/2014 1450   ALKPHOS 69 01/28/2014 2005   AST 13 08/08/2014 1450   AST 15 01/28/2014 2005   ALT 11 08/08/2014 1450   ALT 12 01/28/2014 2005    BILITOT 0.45 08/08/2014 1450   BILITOT 0.4 01/28/2014 2005       Lab Results  Component Value Date   WBC 4.7 08/08/2014   HGB 12.8 08/08/2014   HCT 39.3 08/08/2014   MCV 89.4 08/08/2014   PLT 173 08/08/2014   NEUTROABS 3.3 08/08/2014     ASSESSMENT & PLAN:  Breast cancer, left breast 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 Final pathology revealed a 0.7 cm low-grade invasive ductal carcinoma that was ER positive PR negative with a low Ki-67 of 12%. Status post radiation therapy but could not tolerate antiestrogen therapy in spite of trying other medications.  Breast cancer surveillance: 1. Breast exam 08/08/2014 is normal 2. Mammogram 10/24/2013 is normal  Survivorship:Discussed the importance of physical exercise in decreasing the likelihood of breast cancer recurrence. Recommended 30 mins daily 6 days a week of either brisk walking or cycling or swimming. Encouraged patient to eat more fruits and vegetables and decrease red meat.   Fatigue and decreased appetite: I encouraged her to take vitamin D as well as staying active in outdoor activities. Return to clinic in 6 months for surveillance and follow-up.  I encouraged her to take vitamin D supplementation No orders of the defined types were placed in this encounter.   The patient has a good understanding of the overall plan. she agrees with it. She will call with any problems that may develop before her next visit here.   Rulon Eisenmenger, MD

## 2015-02-01 ENCOUNTER — Ambulatory Visit
Admission: RE | Admit: 2015-02-01 | Discharge: 2015-02-01 | Disposition: A | Discharge status: Home / Self Care | Payer: BLUE CROSS/BLUE SHIELD | Source: Ambulatory Visit | Attending: Orthopedic Surgery | Admitting: Orthopedic Surgery

## 2015-02-01 ENCOUNTER — Other Ambulatory Visit: Payer: Self-pay | Admitting: Orthopedic Surgery

## 2015-02-01 DIAGNOSIS — M25572 Pain in left ankle and joints of left foot: Secondary | ICD-10-CM

## 2015-02-10 NOTE — Assessment & Plan Note (Signed)
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 Final pathology revealed a 0.7 cm low-grade invasive ductal carcinoma that was ER positive PR negative with a low Ki-67 of 12%. Status post radiation therapy but could not tolerate antiestrogen therapy in spite of trying other medications.  Breast cancer surveillance: 1. Breast exam 02/11/2015 is normal 2. Mammogram 10/24/2013 is normal  Fatigue and decreased appetite: I encouraged her to take vitamin D as well as staying active in outdoor activities.  Return to clinic in 1 year for surveillance and follow-up.

## 2015-02-11 ENCOUNTER — Encounter: Payer: Self-pay | Admitting: Hematology and Oncology

## 2015-02-11 ENCOUNTER — Ambulatory Visit (HOSPITAL_BASED_OUTPATIENT_CLINIC_OR_DEPARTMENT_OTHER): Payer: BLUE CROSS/BLUE SHIELD | Admitting: Hematology and Oncology

## 2015-02-11 VITALS — BP 123/74 | HR 91 | Temp 98.2°F | Resp 20 | Ht 65.5 in | Wt 130.3 lb

## 2015-02-11 DIAGNOSIS — Z853 Personal history of malignant neoplasm of breast: Secondary | ICD-10-CM | POA: Diagnosis not present

## 2015-02-11 DIAGNOSIS — C50412 Malignant neoplasm of upper-outer quadrant of left female breast: Secondary | ICD-10-CM

## 2015-02-11 IMAGING — CR DG CHEST 2V
2 series · 2 of 2 positions shown · non-contrast
Comparison: None.

CLINICAL DATA: 51-year-old female preoperative study.  Left breast
cancer.

CHEST - 2 VIEW

[view not recorded (1 of 2)]
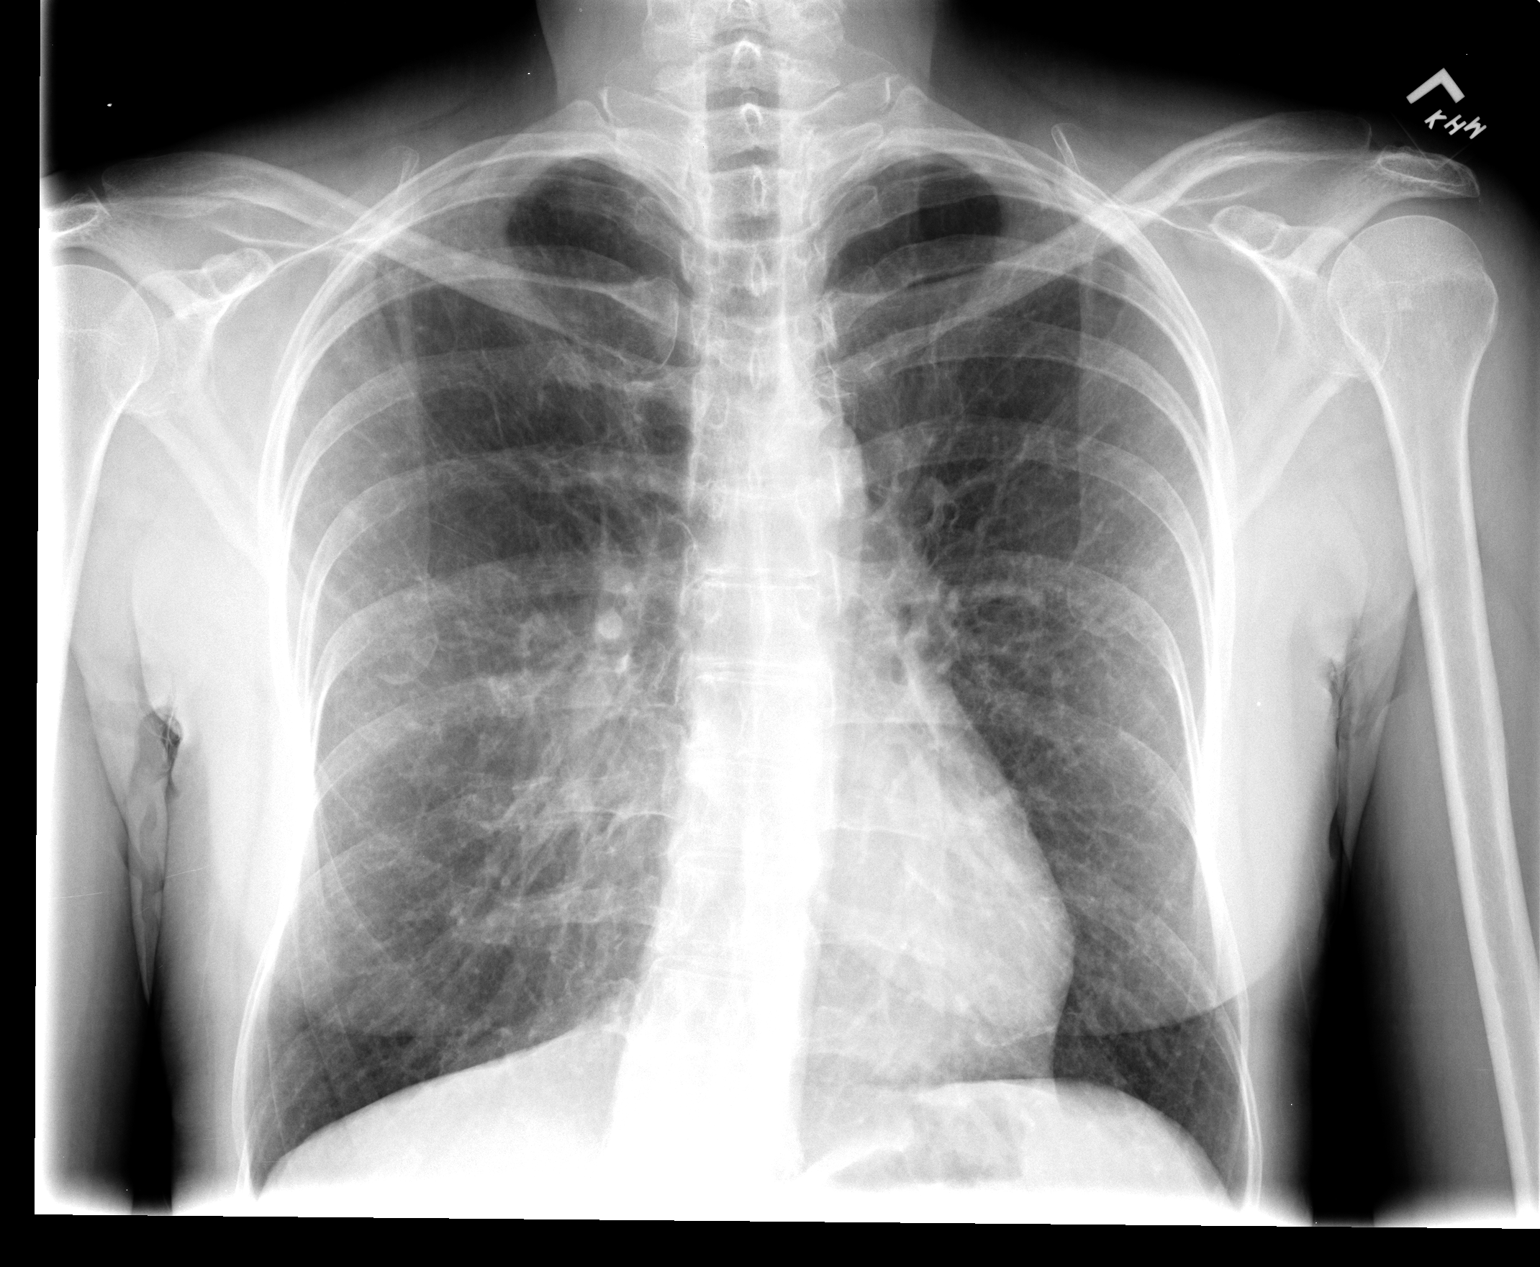

[view not recorded (2 of 2)]
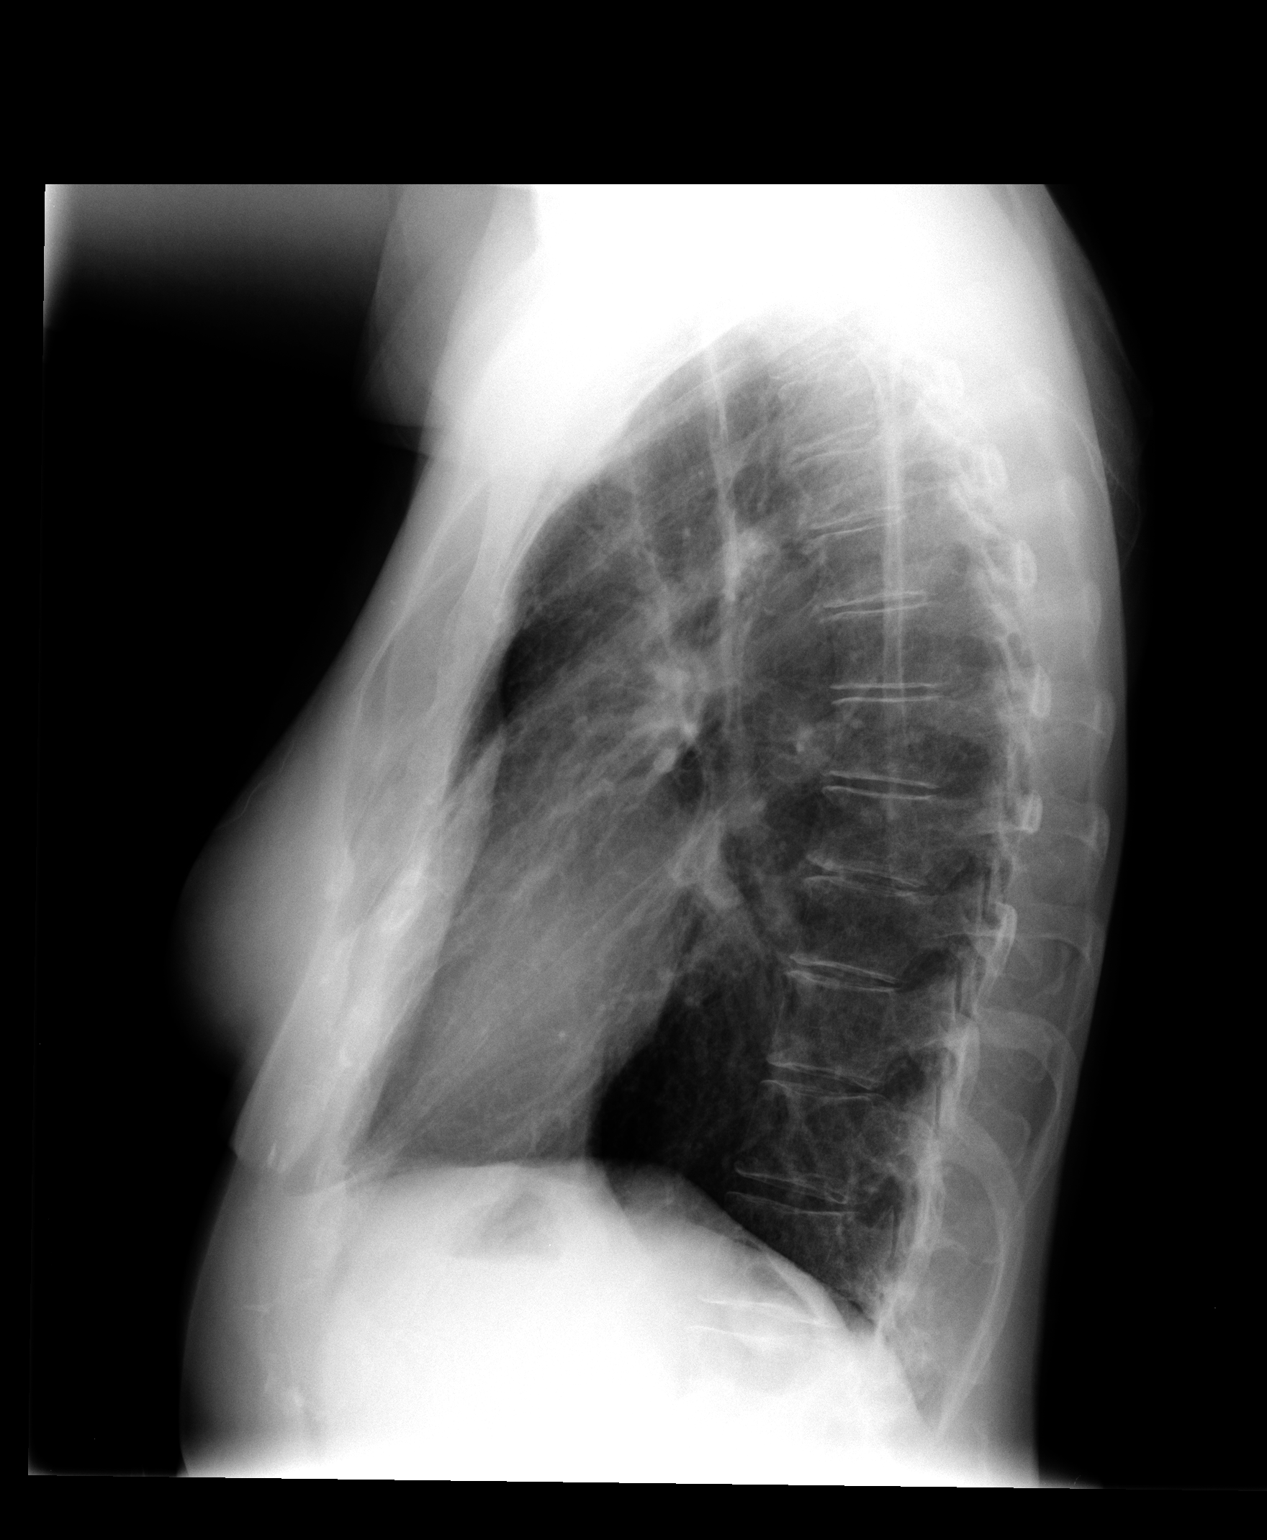

[2 of 2 positions shown; findings below may reference images not displayed]

FINDINGS: Lung volumes are at the upper limits of normal.  Cardiac
size and mediastinal contours are within normal limits.  Visualized
tracheal air column is within normal limits.  No pneumothorax or
pleural effusion.  Mild pectus exit bottom.  No consolidation or
confluent pulmonary opacity.  Mild increased interstitial markings
diffusely. No acute osseous abnormality identified.
IMPRESSION: No acute cardiopulmonary abnormality.

## 2015-02-11 NOTE — Progress Notes (Signed)
Patient Care Team: Rory Percy, MD as PCP - General (Family Medicine) Neldon Mc, MD as Attending Physician (General Surgery) Gari Crown, MD (Gynecology)  DIAGNOSIS: Stage I left breast cancer.  STAGE: Left breast T1 N0 ER positive, PR negative, Ki-6712%  CHIEF COMPLIANT: Followup of breast cancer  INTERVAL HISTORY: Doris Bender is a 54 year old with above-mentioned history left breast stage I cancer. She is here for a six-month follow-up. She reports no problems or concerns. Since she is not taking antiestrogen therapy, she no longer has complaints of fatigue. She has a problem with her foot which she is getting evaluated.  REVIEW OF SYSTEMS:   Constitutional: Denies fevers, chills or abnormal weight loss Eyes: Denies blurriness of vision Ears, nose, mouth, throat, and face: Denies mucositis or sore throat Respiratory: Denies cough, dyspnea or wheezes Cardiovascular: Denies palpitation, chest discomfort or lower extremity swelling Gastrointestinal:  Denies nausea, heartburn or change in bowel habits Skin: Denies abnormal skin rashes Lymphatics: Denies new lymphadenopathy or easy bruising Neurological:Denies numbness, tingling or new weaknesses Behavioral/Psych: Mood is stable, no new changes  Breast:  denies any pain or lumps or nodules in either breasts All other systems were reviewed with the patient and are negative.  I have reviewed the past medical history, past surgical history, social history and family history with the patient and they are unchanged from previous note.  ALLERGIES:  is allergic to ibuprofen; keflex; ketorolac; lodine; oxycodone; and robaxin.  MEDICATIONS:  Current Outpatient Prescriptions  Medication Sig Dispense Refill  . ALPRAZolam (XANAX) 0.25 MG tablet Take 0.25 mg by mouth at bedtime as needed for sleep.    Marland Kitchen loratadine (CLARITIN) 10 MG tablet Take 10 mg by mouth daily.     No current facility-administered medications for this visit.     PHYSICAL EXAMINATION: ECOG PERFORMANCE STATUS: 0 - Asymptomatic  Filed Vitals:   02/11/15 1457  BP: 123/74  Pulse: 91  Temp: 98.2 F (36.8 C)  Resp: 20   Filed Weights   02/11/15 1457  Weight: 130 lb 4.8 oz (59.104 kg)    GENERAL:alert, no distress and comfortable SKIN: skin color, texture, turgor are normal, no rashes or significant lesions EYES: normal, Conjunctiva are pink and non-injected, sclera clear OROPHARYNX:no exudate, no erythema and lips, buccal mucosa, and tongue normal  NECK: supple, thyroid normal size, non-tender, without nodularity LYMPH:  no palpable lymphadenopathy in the cervical, axillary or inguinal LUNGS: clear to auscultation and percussion with normal breathing effort HEART: regular rate & rhythm and no murmurs and no lower extremity edema ABDOMEN:abdomen soft, non-tender and normal bowel sounds Musculoskeletal:no cyanosis of digits and no clubbing  NEURO: alert & oriented x 3 with fluent speech, no focal motor/sensory deficits BREAST: No palpable masses or nodules in either right or left breasts. No palpable axillary supraclavicular or infraclavicular adenopathy no breast tenderness or nipple discharge. (exam performed in the presence of a chaperone)  LABORATORY DATA:  I have reviewed the data as listed   Chemistry      Component Value Date/Time   NA 143 08/08/2014 1450   NA 138 01/28/2014 2005   K 4.6 08/08/2014 1450   K 3.9 01/28/2014 2005   CL 100 01/28/2014 2005   CO2 27 08/08/2014 1450   CO2 28 01/28/2014 2005   BUN 9.1 08/08/2014 1450   BUN 10 01/28/2014 2005   CREATININE 0.9 08/08/2014 1450   CREATININE 0.86 01/28/2014 2005      Component Value Date/Time   CALCIUM 9.1 08/08/2014 1450  CALCIUM 8.8 01/28/2014 2005   ALKPHOS 66 08/08/2014 1450   ALKPHOS 69 01/28/2014 2005   AST 13 08/08/2014 1450   AST 15 01/28/2014 2005   ALT 11 08/08/2014 1450   ALT 12 01/28/2014 2005   BILITOT 0.45 08/08/2014 1450   BILITOT 0.4 01/28/2014  2005       Lab Results  Component Value Date   WBC 4.7 08/08/2014   HGB 12.8 08/08/2014   HCT 39.3 08/08/2014   MCV 89.4 08/08/2014   PLT 173 08/08/2014   NEUTROABS 3.3 08/08/2014   ASSESSMENT & PLAN:  Breast cancer of upper-outer quadrant of left female breast ([place]) 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 Final pathology revealed a 0.7 cm low-grade invasive ductal carcinoma that was ER positive PR negative with a low Ki-67 of 12%. Status post radiation therapy but could not tolerate antiestrogen therapy in spite of trying other medications.  Breast cancer surveillance: 1. Breast exam 02/11/2015 is normal 2. Mammogram 10/24/2013 is normal  Fatigue and decreased appetite: I encouraged her to take vitamin D as well as staying active in outdoor activities. Patient wishes to follow-up with her gynecologist for her annual breast exams and follow-ups. I believe that is perfectly reasonable and would be happy to see her on an as-needed basis.  No orders of the defined types were placed in this encounter.   The patient has a good understanding of the overall plan. she agrees with it. she will call with any problems that may develop before the next visit here.   Rulon Eisenmenger, MD

## 2015-06-16 ENCOUNTER — Emergency Department (HOSPITAL_COMMUNITY): Payer: BLUE CROSS/BLUE SHIELD

## 2015-06-16 ENCOUNTER — Emergency Department (HOSPITAL_COMMUNITY)
Admission: EM | Admit: 2015-06-16 | Discharge: 2015-06-16 | Disposition: A | Discharge status: Home / Self Care | Payer: BLUE CROSS/BLUE SHIELD | Attending: Emergency Medicine | Admitting: Emergency Medicine

## 2015-06-16 ENCOUNTER — Encounter (HOSPITAL_COMMUNITY): Payer: Self-pay

## 2015-06-16 DIAGNOSIS — F17210 Nicotine dependence, cigarettes, uncomplicated: Secondary | ICD-10-CM | POA: Diagnosis not present

## 2015-06-16 DIAGNOSIS — R1013 Epigastric pain: Secondary | ICD-10-CM | POA: Insufficient documentation

## 2015-06-16 DIAGNOSIS — R079 Chest pain, unspecified: Secondary | ICD-10-CM | POA: Diagnosis present

## 2015-06-16 LAB — LIPASE, BLOOD: LIPASE: 76 U/L — AB (ref 11–51)

## 2015-06-16 LAB — COMPREHENSIVE METABOLIC PANEL
ALT: 13 U/L — AB (ref 14–54)
ANION GAP: 7 (ref 5–15)
AST: 16 U/L (ref 15–41)
Albumin: 4.1 g/dL (ref 3.5–5.0)
Alkaline Phosphatase: 78 U/L (ref 38–126)
BUN: 11 mg/dL (ref 6–20)
CALCIUM: 9.3 mg/dL (ref 8.9–10.3)
CHLORIDE: 104 mmol/L (ref 101–111)
CO2: 30 mmol/L (ref 22–32)
CREATININE: 0.91 mg/dL (ref 0.44–1.00)
Glucose, Bld: 112 mg/dL — ABNORMAL HIGH (ref 65–99)
Potassium: 4.3 mmol/L (ref 3.5–5.1)
Sodium: 141 mmol/L (ref 135–145)
Total Bilirubin: 0.6 mg/dL (ref 0.3–1.2)
Total Protein: 6.5 g/dL (ref 6.5–8.1)

## 2015-06-16 LAB — CBC
HCT: 41.1 % (ref 36.0–46.0)
Hemoglobin: 13.9 g/dL (ref 12.0–15.0)
MCH: 31.4 pg (ref 26.0–34.0)
MCHC: 33.8 g/dL (ref 30.0–36.0)
MCV: 92.8 fL (ref 78.0–100.0)
PLATELETS: 171 10*3/uL (ref 150–400)
RBC: 4.43 MIL/uL (ref 3.87–5.11)
RDW: 12.7 % (ref 11.5–15.5)
WBC: 5.4 10*3/uL (ref 4.0–10.5)

## 2015-06-16 LAB — APTT: aPTT: 26 seconds (ref 24–37)

## 2015-06-16 LAB — I-STAT TROPONIN, ED: TROPONIN I, POC: 0 ng/mL (ref 0.00–0.08)

## 2015-06-16 LAB — PROTIME-INR
INR: 1.14 (ref 0.00–1.49)
PROTHROMBIN TIME: 14.8 s (ref 11.6–15.2)

## 2015-06-16 LAB — D-DIMER, QUANTITATIVE: D-Dimer, Quant: 0.35 ug/mL-FEU (ref 0.00–0.50)

## 2015-06-16 MED ORDER — TRAMADOL HCL 50 MG PO TABS: 50.0000 mg | ORAL_TABLET | Freq: Four times a day (QID) | ORAL | Status: DC | PRN

## 2015-06-16 MED ORDER — DIATRIZOATE MEGLUMINE & SODIUM 66-10 % PO SOLN
ORAL | Status: AC
  Administered 2015-06-16: 30 mL
  Filled 2015-06-16: qty 30

## 2015-06-16 MED ORDER — IOHEXOL 300 MG/ML  SOLN
100.0000 mL | Freq: Once | INTRAMUSCULAR | Status: AC | PRN
  Administered 2015-06-16: 100 mL via INTRAVENOUS

## 2015-06-16 NOTE — ED Notes (Signed)
12 lead EKG completed in triage and given to Dr. Tomi Bamberger

## 2015-06-16 NOTE — Discharge Instructions (Signed)

## 2015-06-16 NOTE — ED Notes (Signed)
Pt states while cooking this am she began experincing sharp mid chest pain with radiation into the right shoulder. States pain is now intermittent. Also c/o of constant dizziness since chest pain began

## 2015-06-16 NOTE — ED Notes (Signed)
Pt denies any further episodes of chest pain.

## 2015-06-16 NOTE — ED Provider Notes (Signed)
CSN: [id number]     Arrival date & time 06/16/15  1259 History   First MD Initiated Contact with Patient 06/16/15 1303     Chief Complaint  Patient presents with  . Chest Pain   HPI The patient presented to the emergency room with complaints of sharp pain in the middle of her chest. Started this morning while she was cooking. The pain radiated to the right shoulder. Associated with that pain she had discomfort and upper abdomen. She denies any trouble with nausea or vomiting. No fevers or coughing. She did have some lightheadedness associated with the chest pain. Symptoms were intermittent and have resolved. She denies any fevers or coughing. No diarrhea. No leg swelling. She does not have a history of heart disease or lung disease. No history of pulmonary embolism. She does smoke daily. No past medical history on file. Past Surgical History  Procedure Laterality Date  . Tonsillectomy     No family history on file. Social History  Substance Use Topics  . Smoking status: Current Every Day Smoker -- 1.00 packs/day    Types: Cigarettes  . Smokeless tobacco: Not on file  . Alcohol Use: No   OB History    No data available     Review of Systems  All other systems reviewed and are negative.     Allergies  Ibuprofen; Keflex; Ketorolac; Lodine; Methocarbamol; and Oxycodone  Home Medications   Prior to Admission medications   Medication Sig Start Date End Date Taking? Authorizing Provider  ALPRAZolam Duanne Moron) 0.25 MG tablet Take 1 tablet by mouth at bedtime. 06/05/15  Yes Historical Provider, MD  HYDROcodone-acetaminophen (NORCO/VICODIN) 5-325 MG tablet Take 1 tablet by mouth daily as needed (arthritis pain).   Yes Historical Provider, MD  naproxen sodium (ALEVE) 220 MG tablet Take 220 mg by mouth daily as needed (headache).   Yes Historical Provider, MD  traMADol (ULTRAM) 50 MG tablet Take 1 tablet (50 mg total) by mouth every 6 (six) hours as needed. 06/16/15   Dorie Rank, MD   BP  118/68 mmHg  Pulse 68  Temp(Src) 98.1 F (36.7 C) (Oral)  Resp 16  Ht 5\' 5"  (1.651 m)  Wt 58.06 kg  BMI 21.30 kg/m2  SpO2 100% Physical Exam  Constitutional: She appears well-developed and well-nourished. No distress.  HENT:  Head: Normocephalic and atraumatic.  Right Ear: External ear normal.  Left Ear: External ear normal.  Eyes: Conjunctivae are normal. Right eye exhibits no discharge. Left eye exhibits no discharge. No scleral icterus.  Neck: Neck supple. No tracheal deviation present.  Cardiovascular: Normal rate, regular rhythm and intact distal pulses.   Pulmonary/Chest: Effort normal and breath sounds normal. No stridor. No respiratory distress. She has no wheezes. She has no rales.  Abdominal: Soft. Bowel sounds are normal. She exhibits no distension. There is tenderness. There is no rebound and no guarding.  Mild and epigastric region  Musculoskeletal: She exhibits no edema or tenderness.  Neurological: She is alert. She has normal strength. No cranial nerve deficit (no facial droop, extraocular movements intact, no slurred speech) or sensory deficit. She exhibits normal muscle tone. She displays no seizure activity. Coordination normal.  Skin: Skin is warm and dry. No rash noted.  Psychiatric: She has a normal mood and affect.  Nursing note and vitals reviewed.   ED Course  Procedures (including critical care time) Labs Review Labs Reviewed  COMPREHENSIVE METABOLIC PANEL - Abnormal; Notable for the following:    Glucose, Bld 112 (*)  ALT 13 (*)    All other components within normal limits  LIPASE, BLOOD - Abnormal; Notable for the following:    Lipase 76 (*)    All other components within normal limits  APTT  CBC  PROTIME-INR  D-DIMER, QUANTITATIVE (NOT AT [facility])  Randolm Idol, ED    Imaging Review Dg Chest 2 View  06/16/2015  CLINICAL DATA:  Chest pain for 1 day EXAM: CHEST  2 VIEW COMPARISON:  January 18, 2015 FINDINGS: There is slight scarring in  the apices. There is no edema or consolidation. Heart size and pulmonary vascularity are normal. No adenopathy. No bone lesions. No pneumothorax. IMPRESSION: Mild scarring in the apices.  No edema or consolidation. Electronically Signed   By: Lowella Grip III M.D.   On: 06/16/2015 14:22   Ct Abdomen Pelvis W Contrast  06/16/2015  CLINICAL DATA:  Chest and epigastric abdominal pain. Elevated lipase levels. No history of abdominal surgery. EXAM: CT ABDOMEN AND PELVIS WITH CONTRAST TECHNIQUE: Multidetector CT imaging of the abdomen and pelvis was performed using the standard protocol following bolus administration of intravenous contrast. CONTRAST:  125mL OMNIPAQUE IOHEXOL 300 MG/ML  SOLN COMPARISON:  None. FINDINGS: Lower chest: Clear lung bases. No significant pleural or pericardial effusion. Hepatobiliary: The liver is normal in density without focal abnormality. No evidence of gallstones, gallbladder wall thickening or biliary dilatation. Pancreas: Unremarkable. No pancreatic ductal dilatation or surrounding inflammatory changes. Spleen: Normal in size without focal abnormality. Adrenals/Urinary Tract: Both adrenal glands appear normal. The kidneys appear normal without evidence of urinary tract calculus, suspicious lesion or hydronephrosis. No bladder abnormalities are seen. Stomach/Bowel: No evidence of bowel wall thickening, distention or surrounding inflammatory change. The appendix appears normal. There is mild transitory dilatation of the third portion of the duodenum proximal to the superior mesenteric artery on the portal phase images, less impressive on the delayed phase images and within physiologic limits. Vascular/Lymphatic: There are no enlarged abdominal or pelvic lymph nodes. Minimal atherosclerosis noted. The left renal vein is retro aortic. Reproductive: Unremarkable. Other: No evidence of abdominal wall mass or hernia. Musculoskeletal: Superior endplate compression deformity at L4  demonstrates no definite acute features. There is a convex right thoracolumbar scoliosis and mild spondylosis. There are bilateral L5 pars defects, but only mild foraminal narrowing at L5-S1. No definite acute osseous findings are seen. IMPRESSION: 1. No acute abdominal pelvic findings demonstrated. The pancreas appears within normal limits. The appendix appears normal. 2. Chronic appearing superior endplate compression deformity at L4. Bilateral L5 pars defects. Electronically Signed   By: Richardean Sale M.D.   On: 06/16/2015 16:43   I have personally reviewed and evaluated these images and lab results as part of my medical decision-making.   EKG Interpretation   Date/Time:  Sunday June 16 2015 13:05:32 EST Ventricular Rate:  92 PR Interval:  140 QRS Duration: 80 QT Interval:  366 QTC Calculation: 452 R Axis:   112 Text Interpretation:  Normal sinus rhythm Low voltage QRS Left posterior  fascicular block Abnormal ECG No old tracing to compare Confirmed by Sunshine Mackowski   MD-J, Adriane Gabbert [id number]) on 06/16/2015 1:07:53 PM      MDM   Final diagnoses:  Chest pain, unspecified chest pain type    Patient's laboratory tests are reassuring. D-dimer is negative. Troponin is negative. I doubt that her symptoms are related to acute coronary syndrome  Patient did have an elevated lipase with some upper abdominal discomfort. CT scan was performed. No evidence of any acute abnormality  including any inflammation of the pancreas.  At this time there does not appear to be any evidence of an acute emergency medical condition and the patient appears stable for discharge with appropriate outpatient follow up. Dc home with meds for pain.  Follow up with PCP     Dorie Rank, MD 06/16/15 [phone]

## 2015-07-24 DIAGNOSIS — M7712 Lateral epicondylitis, left elbow: Secondary | ICD-10-CM | POA: Insufficient documentation

## 2015-09-04 ENCOUNTER — Other Ambulatory Visit: Payer: Self-pay | Admitting: Orthopedic Surgery

## 2015-09-04 ENCOUNTER — Encounter (HOSPITAL_BASED_OUTPATIENT_CLINIC_OR_DEPARTMENT_OTHER): Payer: Self-pay | Admitting: *Deleted

## 2015-09-11 ENCOUNTER — Encounter (HOSPITAL_BASED_OUTPATIENT_CLINIC_OR_DEPARTMENT_OTHER): Payer: Self-pay | Admitting: *Deleted

## 2015-09-12 ENCOUNTER — Ambulatory Visit (HOSPITAL_BASED_OUTPATIENT_CLINIC_OR_DEPARTMENT_OTHER)
Admission: RE | Admit: 2015-09-12 | Discharge: 2015-09-12 | Disposition: A | Discharge status: Home / Self Care | Payer: BLUE CROSS/BLUE SHIELD | Source: Ambulatory Visit | Attending: Orthopedic Surgery | Admitting: Orthopedic Surgery

## 2015-09-12 ENCOUNTER — Encounter (HOSPITAL_BASED_OUTPATIENT_CLINIC_OR_DEPARTMENT_OTHER)
Admission: RE | Disposition: A | Discharge status: Home / Self Care | Payer: Self-pay | Source: Ambulatory Visit | Attending: Orthopedic Surgery

## 2015-09-12 ENCOUNTER — Encounter (HOSPITAL_BASED_OUTPATIENT_CLINIC_OR_DEPARTMENT_OTHER): Payer: Self-pay | Admitting: Orthopedic Surgery

## 2015-09-12 ENCOUNTER — Ambulatory Visit (HOSPITAL_BASED_OUTPATIENT_CLINIC_OR_DEPARTMENT_OTHER): Payer: BLUE CROSS/BLUE SHIELD | Admitting: Anesthesiology

## 2015-09-12 DIAGNOSIS — M7712 Lateral epicondylitis, left elbow: Secondary | ICD-10-CM | POA: Diagnosis not present

## 2015-09-12 DIAGNOSIS — Z853 Personal history of malignant neoplasm of breast: Secondary | ICD-10-CM | POA: Diagnosis not present

## 2015-09-12 DIAGNOSIS — Z923 Personal history of irradiation: Secondary | ICD-10-CM | POA: Insufficient documentation

## 2015-09-12 DIAGNOSIS — F17200 Nicotine dependence, unspecified, uncomplicated: Secondary | ICD-10-CM | POA: Insufficient documentation

## 2015-09-12 DIAGNOSIS — F419 Anxiety disorder, unspecified: Secondary | ICD-10-CM | POA: Insufficient documentation

## 2015-09-12 HISTORY — PX: TENNIS ELBOW RELEASE/NIRSCHEL PROCEDURE: SHX6651

## 2015-09-12 SURGERY — TENNIS ELBOW RELEASE/NIRSCHEL PROCEDURE
Anesthesia: General | Site: Elbow | Laterality: Left

## 2015-09-12 MED ORDER — MIDAZOLAM HCL 2 MG/2ML IJ SOLN
1.0000 mg | INTRAMUSCULAR | Status: DC | PRN
  Administered 2015-09-12 (×2): 1 mg via INTRAVENOUS

## 2015-09-12 MED ORDER — LACTATED RINGERS IV SOLN
INTRAVENOUS | Status: DC
  Administered 2015-09-12: 09:00:00 via INTRAVENOUS
  Administered 2015-09-12: 10 mL/h via INTRAVENOUS

## 2015-09-12 MED ORDER — MIDAZOLAM HCL 2 MG/2ML IJ SOLN
INTRAMUSCULAR | Status: AC
Start: 2015-09-12 — End: 2015-09-12
  Filled 2015-09-12: qty 2

## 2015-09-12 MED ORDER — VANCOMYCIN HCL IN DEXTROSE 1-5 GM/200ML-% IV SOLN
1000.0000 mg | INTRAVENOUS | Status: AC
  Administered 2015-09-12: 1000 mg via INTRAVENOUS

## 2015-09-12 MED ORDER — FENTANYL CITRATE (PF) 100 MCG/2ML IJ SOLN
INTRAMUSCULAR | Status: AC
  Filled 2015-09-12: qty 2

## 2015-09-12 MED ORDER — ONDANSETRON HCL 4 MG/2ML IJ SOLN: 4.0000 mg | Freq: Once | INTRAMUSCULAR | Status: DC | PRN

## 2015-09-12 MED ORDER — ONDANSETRON HCL 4 MG/2ML IJ SOLN
INTRAMUSCULAR | Status: DC | PRN
  Administered 2015-09-12: 4 mg via INTRAVENOUS

## 2015-09-12 MED ORDER — ONDANSETRON HCL 4 MG/2ML IJ SOLN
INTRAMUSCULAR | Status: AC
  Filled 2015-09-12: qty 2

## 2015-09-12 MED ORDER — VANCOMYCIN HCL IN DEXTROSE 1-5 GM/200ML-% IV SOLN
INTRAVENOUS | Status: AC
  Filled 2015-09-12: qty 200

## 2015-09-12 MED ORDER — BUPIVACAINE HCL (PF) 0.25 % IJ SOLN
INTRAMUSCULAR | Status: DC | PRN
  Administered 2015-09-12: 5 mL

## 2015-09-12 MED ORDER — HYDROCODONE-ACETAMINOPHEN 5-325 MG PO TABS: 1.0000 | ORAL_TABLET | Freq: Four times a day (QID) | ORAL | Status: DC | PRN

## 2015-09-12 MED ORDER — DEXAMETHASONE SODIUM PHOSPHATE 4 MG/ML IJ SOLN
INTRAMUSCULAR | Status: DC | PRN
Start: 2015-09-12 — End: 2015-09-12
  Administered 2015-09-12: 10 mg via INTRAVENOUS

## 2015-09-12 MED ORDER — DEXAMETHASONE SODIUM PHOSPHATE 10 MG/ML IJ SOLN
INTRAMUSCULAR | Status: AC
  Filled 2015-09-12: qty 1

## 2015-09-12 MED ORDER — LIDOCAINE 2% (20 MG/ML) 5 ML SYRINGE
INTRAMUSCULAR | Status: DC | PRN
  Administered 2015-09-12: 50 mg via INTRAVENOUS

## 2015-09-12 MED ORDER — SCOPOLAMINE 1 MG/3DAYS TD PT72: 1.0000 | MEDICATED_PATCH | Freq: Once | TRANSDERMAL | Status: DC | PRN

## 2015-09-12 MED ORDER — CHLORHEXIDINE GLUCONATE 4 % EX LIQD: 60.0000 mL | Freq: Once | CUTANEOUS | Status: DC

## 2015-09-12 MED ORDER — PROPOFOL 10 MG/ML IV BOLUS
INTRAVENOUS | Status: DC | PRN
  Administered 2015-09-12: 150 mg via INTRAVENOUS

## 2015-09-12 MED ORDER — FENTANYL CITRATE (PF) 100 MCG/2ML IJ SOLN
25.0000 ug | INTRAMUSCULAR | Status: DC | PRN
  Administered 2015-09-12: 25 ug via INTRAVENOUS
  Administered 2015-09-12: 50 ug via INTRAVENOUS
  Administered 2015-09-12: 25 ug via INTRAVENOUS

## 2015-09-12 MED ORDER — MIDAZOLAM HCL 2 MG/2ML IJ SOLN
INTRAMUSCULAR | Status: AC
  Filled 2015-09-12: qty 2

## 2015-09-12 MED ORDER — GLYCOPYRROLATE 0.2 MG/ML IJ SOLN: 0.2000 mg | Freq: Once | INTRAMUSCULAR | Status: DC | PRN

## 2015-09-12 MED ORDER — FENTANYL CITRATE (PF) 100 MCG/2ML IJ SOLN
50.0000 ug | INTRAMUSCULAR | Status: DC | PRN
  Administered 2015-09-12 (×2): 25 ug via INTRAVENOUS

## 2015-09-12 SURGICAL SUPPLY — 49 items
ANCHOR JUGGERKNOT 1.0 1DR 2-0 (Anchor) ×6 IMPLANT
ANCHOR SUT 1.45 SZ 1 SHORT (Anchor) ×6 IMPLANT
BLADE MINI RND TIP GREEN BEAV (BLADE) ×3 IMPLANT
BLADE SURG 15 STRL LF DISP TIS (BLADE) ×2 IMPLANT
BLADE SURG 15 STRL SS (BLADE) ×1
BNDG COHESIVE 3X5 TAN STRL LF (GAUZE/BANDAGES/DRESSINGS) ×6 IMPLANT
BNDG ESMARK 4X9 LF (GAUZE/BANDAGES/DRESSINGS) ×3 IMPLANT
BNDG GAUZE ELAST 4 BULKY (GAUZE/BANDAGES/DRESSINGS) ×3 IMPLANT
CHLORAPREP W/TINT 26ML (MISCELLANEOUS) ×3 IMPLANT
CORDS BIPOLAR (ELECTRODE) ×3 IMPLANT
COVER BACK TABLE 60X90IN (DRAPES) ×3 IMPLANT
COVER MAYO STAND STRL (DRAPES) ×3 IMPLANT
CUFF TOURN SGL LL 18 NRW (TOURNIQUET CUFF) ×3 IMPLANT
DECANTER SPIKE VIAL GLASS SM (MISCELLANEOUS) IMPLANT
DRAPE EXTREMITY T 121X128X90 (DRAPE) ×3 IMPLANT
DRAPE SURG 17X23 STRL (DRAPES) ×3 IMPLANT
DRSG PAD ABDOMINAL 8X10 ST (GAUZE/BANDAGES/DRESSINGS) ×3 IMPLANT
GAUZE SPONGE 4X4 12PLY STRL (GAUZE/BANDAGES/DRESSINGS) ×3 IMPLANT
GAUZE SPONGE 4X4 16PLY XRAY LF (GAUZE/BANDAGES/DRESSINGS) IMPLANT
GAUZE XEROFORM 1X8 LF (GAUZE/BANDAGES/DRESSINGS) ×3 IMPLANT
GLOVE BIO SURGEON STRL SZ7 (GLOVE) ×6 IMPLANT
GLOVE BIOGEL PI IND STRL 8.5 (GLOVE) ×2 IMPLANT
GLOVE BIOGEL PI INDICATOR 8.5 (GLOVE) ×1
GLOVE SURG ORTHO 8.0 STRL STRW (GLOVE) ×3 IMPLANT
GOWN STRL REUS W/ TWL LRG LVL3 (GOWN DISPOSABLE) ×2 IMPLANT
GOWN STRL REUS W/TWL LRG LVL3 (GOWN DISPOSABLE) ×1
GOWN STRL REUS W/TWL XL LVL3 (GOWN DISPOSABLE) ×3 IMPLANT
LOOP VESSEL MAXI BLUE (MISCELLANEOUS) IMPLANT
NEEDLE PRECISIONGLIDE 27X1.5 (NEEDLE) IMPLANT
NS IRRIG 1000ML POUR BTL (IV SOLUTION) ×3 IMPLANT
PACK BASIN DAY SURGERY FS (CUSTOM PROCEDURE TRAY) ×3 IMPLANT
PAD CAST 3X4 CTTN HI CHSV (CAST SUPPLIES) IMPLANT
PAD CAST 4YDX4 CTTN HI CHSV (CAST SUPPLIES) ×2 IMPLANT
PADDING CAST COTTON 3X4 STRL (CAST SUPPLIES)
PADDING CAST COTTON 4X4 STRL (CAST SUPPLIES) ×1
SLEEVE SCD COMPRESS KNEE MED (MISCELLANEOUS) ×3 IMPLANT
SLING ARM FOAM STRAP MED (SOFTGOODS) ×3 IMPLANT
SPLINT PLASTER CAST XFAST 3X15 (CAST SUPPLIES) IMPLANT
SPLINT PLASTER XTRA FASTSET 3X (CAST SUPPLIES)
STOCKINETTE 4X48 STRL (DRAPES) ×3 IMPLANT
SUT ETHILON 4 0 PS 2 18 (SUTURE) ×3 IMPLANT
SUT MERSILENE 2.0 SH NDLE (SUTURE) ×3 IMPLANT
SUT VIC AB 2-0 SH 27 (SUTURE) ×1
SUT VIC AB 2-0 SH 27XBRD (SUTURE) ×2 IMPLANT
SUT VICRYL 4-0 PS2 18IN ABS (SUTURE) ×3 IMPLANT
SYR BULB 3OZ (MISCELLANEOUS) ×3 IMPLANT
SYR CONTROL 10ML LL (SYRINGE) IMPLANT
TOWEL OR 17X24 6PK STRL BLUE (TOWEL DISPOSABLE) ×3 IMPLANT
UNDERPAD 30X30 (UNDERPADS AND DIAPERS) ×3 IMPLANT

## 2015-09-12 NOTE — Anesthesia Procedure Notes (Signed)
Procedure Name: LMA Insertion Date/Time: 09/12/2015 9:58 AM Performed by: Lyndee Leo Pre-anesthesia Checklist: Patient identified, Emergency Drugs available, Suction available and Patient being monitored Patient Re-evaluated:Patient Re-evaluated prior to inductionOxygen Delivery Method: Circle System Utilized Preoxygenation: Pre-oxygenation with 100% oxygen Intubation Type: IV induction Ventilation: Mask ventilation without difficulty LMA: LMA inserted LMA Size: 3.0 Number of attempts: 1 Airway Equipment and Method: Bite block Placement Confirmation: positive ETCO2 Tube secured with: Tape Dental Injury: Teeth and Oropharynx as per pre-operative assessment

## 2015-09-12 NOTE — H&P (Signed)
Doris Bender is an 55 y.o. female.   Chief Complaint: left elbow pain HPI:[name] is a 55yo female  withlateral epicondylitis of left elbow. She states this began in April 2016. She continues to complain of pain over the lateral epicondyle of her left elbow. She has had an MRI of the left side showing severe extensor origin disease. She continues to complain of pain, discomfort on the lateral aspect of her left elbow in the epicondyle. This has been going on for a year and has become chronic in nature and does not appear to have healed. She continues to complain of a chronic pain on the lateral side of her elbow. This has not abated with conservative treatment.    Past Medical History  Diagnosis Date  . Contact lens/glasses fitting     wears contacts or glasses  . Wears partial dentures     partial lower  . Cancer ([place])     breast  . Compression fracture of L4 lumbar vertebra ([place]) 11/04/2012    injury  . Raynaud's disease     bilateral hands and feet  . Arthritis     lt great toe ([place])  . Osteoporosis   . Pseudogout of joint of left foot 6 yrs ago  . Gout 8-9 yrs ago    Rt heel/foot  . Breast cancer ([place]) 11/15/12    left  . History of radiation therapy 02/20/13-04/07/13    left breast 50.4 gray, upper outer quadrant boosted to 62.4 gray    Past Surgical History  Procedure Laterality Date  . Wrist ganglion excision      left x2  . Tonsillectomy    . Knee arthroscopy  2006    right  . Cervical conization w/bx  2012    benign  . Lateral epicondyle release  03/08/2012    Procedure: TENNIS ELBOW RELEASE;  Surgeon: Cammie Sickle., MD;  Location: [facility];  Service: Orthopedics;  Laterality: Right;  RECONSTRUCTION OF RIGHT LATERAL ELBOW WITH TENDONS  . Mass excision  03/08/2012    Procedure: EXCISION MASS;  Surgeon: Cammie Sickle., MD;  Location: [facility];  Service: Orthopedics;  Laterality: Right;  EXCISION OF RIGHT WRIST DORSAL CYST   . Tonsillectomy      55yrs old  . Breast surgery  age 30    left breast bx- benign  . Breast lumpectomy with needle localization and axillary sentinel lymph node bx Left 12/21/2012    Procedure: BREAST LUMPECTOMY WITH NEEDLE LOCALIZATION AND AXILLARY SENTINEL LYMPH NODE BX;  Surgeon: Haywood Lasso, MD;  Location: [facility];  Service: General;  Laterality: Left;  needle local BCG      Family History  Problem Relation Age of Onset  . Hyperthyroidism Mother   . Cirrhosis Father   . Diabetes Brother   . Heart attack Maternal Grandmother   . Heart attack Maternal Grandfather    Social History:  reports that she has been smoking.  She has never used smokeless tobacco. She reports that she drinks alcohol. She reports that she does not use illicit drugs.  Allergies:  Allergies  Allergen Reactions  . Ibuprofen Hives  . Keflex [Cephalexin] Hives  . Ketorolac Hives  . Lodine [Etodolac] Hives  . Oxycodone Nausea And Vomiting  . Robaxin [Methocarbamol] Hives    Medications Prior to Admission  Medication Sig Dispense Refill  . ALPRAZolam (XANAX) 0.25 MG tablet Take 0.25 mg by mouth at bedtime  as needed for sleep.    Marland Kitchen loratadine (CLARITIN) 10 MG tablet Take 10 mg by mouth daily.      No results found for this or any previous visit (from the past 48 hour(s)).  No results found.   Pertinent items are noted in HPI.  Height 5' 5.5" (1.664 m), weight 57.607 kg (127 lb).  General appearance: alert, cooperative and appears stated age Head: Normocephalic, without obvious abnormality Neck: no JVD Resp: clear to auscultation bilaterally Cardio: regular rate and rhythm, S1, S2 normal, no murmur, click, rub or gallop GI: soft, non-tender; bowel sounds normal; no masses,  no organomegaly Extremities: left lateral elbow pain Pulses: 2+ and symmetric Skin: Skin color, texture, turgor normal. No rashes or lesions Neurologic: Grossly  normal Incision/Wound: na  Assessment/Plan DIAGNOSIS: Lateral epicondylitis left elbow.  Plan: Her MRI is reviewed revealing moderate to severe lateral epicondylitis of her left elbow. We have discussed the possibility of surgical exploration with repair, arthrotomy of the joint debridement as dictated by findings, removal of any plica, reattachment of the extensor origin. She is advised that there is no guarantee with the surgery, possibility of infection, recurrence, injury to arteries, nerves, tendons, symptoms distally very similar to the operation was done on her right side. She would like to proceed. This will be scheduled at her convenience for arthrotomy and debridement of her left elbow with reattachment extensor origin lateral epicondyle.    Doris Bender R 09/12/2015, 8:41 AM

## 2015-09-12 NOTE — Transfer of Care (Signed)
Immediate Anesthesia Transfer of Care Note  Patient: Doris Bender  Procedure(s) Performed: Procedure(s): ARTHROTOMY LEFT ELBOW, REPAIR EXTENSOR ORIGIN (Left)  Patient Location: PACU  Anesthesia Type:General  Level of Consciousness: sedated  Airway & Oxygen Therapy: Patient Spontanous Breathing and Patient connected to face mask oxygen  Post-op Assessment: Report given to RN and Post -op Vital signs reviewed and stable  Post vital signs: Reviewed and stable  Last Vitals:  Filed Vitals:   09/12/15 0856 09/12/15 1119  BP: 102/70 104/74  Pulse: 89 81  Temp: 36.7 C   Resp: 16 16    Last Pain: There were no vitals filed for this visit.    Patients Stated Pain Goal: 0 ([id number])  Complications: No apparent anesthesia complications

## 2015-09-12 NOTE — Brief Op Note (Signed)
09/12/2015  11:22 AM  PATIENT:  Doris Bender  55 y.o. female  PRE-OPERATIVE DIAGNOSIS:  lateral epicondylitis left elbow M77.12  POST-OPERATIVE DIAGNOSIS:  lateral epicondylitis left elbow M77.12  PROCEDURE:  Procedure(s): ARTHROTOMY LEFT ELBOW, REPAIR EXTENSOR ORIGIN (Left)  SURGEON:  Surgeon(s) and Role:    * Daryll Brod, MD - Primary  PHYSICIAN ASSISTANT:   ASSISTANTS: none   ANESTHESIA:   local and general  EBL:  Total I/O In: 900 [I.V.:900] Out: -   BLOOD ADMINISTERED:none  DRAINS: none   LOCAL MEDICATIONS USED:  BUPIVICAINE   SPECIMEN:  No Specimen  DISPOSITION OF SPECIMEN:  N/A  COUNTS:  YES  TOURNIQUET:   Total Tourniquet Time Documented: Upper Arm (Left) - -213063 minutes Total: Upper Arm (Left) - -213063 minutes   DICTATION: .Other Dictation: Dictation Number [id number]  PLAN OF CARE: Discharge to home after PACU  PATIENT DISPOSITION:  PACU - hemodynamically stable.

## 2015-09-12 NOTE — Op Note (Signed)
Dictation Number [id number]

## 2015-09-12 NOTE — Anesthesia Postprocedure Evaluation (Signed)
Anesthesia Post Note  Patient: Doris Bender  Procedure(s) Performed: Procedure(s) (LRB): ARTHROTOMY LEFT ELBOW, REPAIR EXTENSOR ORIGIN (Left)  Patient location during evaluation: PACU Anesthesia Type: General Level of consciousness: awake and alert Pain management: pain level controlled Vital Signs Assessment: post-procedure vital signs reviewed and stable Respiratory status: spontaneous breathing, nonlabored ventilation, respiratory function stable and patient connected to nasal cannula oxygen Cardiovascular status: blood pressure returned to baseline and stable Postop Assessment: no signs of nausea or vomiting Anesthetic complications: no    Last Vitals:  Filed Vitals:   09/12/15 1119 09/12/15 1130  BP: 104/74 110/74  Pulse: 81 83  Temp: 36.6 C   Resp: 16 19    Last Pain:  Filed Vitals:   09/12/15 1131  PainSc: 0-No pain                 Rucker Pridgeon JENNETTE

## 2015-09-12 NOTE — Anesthesia Preprocedure Evaluation (Addendum)
Anesthesia Evaluation  Patient identified by MRN, date of birth, ID band Patient awake    Reviewed: Allergy & Precautions, NPO status , Patient's Chart, lab work & pertinent test results  History of Anesthesia Complications Negative for: history of anesthetic complications  Airway Mallampati: II  TM Distance: >3 FB Neck ROM: Full    Dental no notable dental hx. (+) Partial Lower, Poor Dentition   Pulmonary Current Smoker,    Pulmonary exam normal breath sounds clear to auscultation       Cardiovascular + Peripheral Vascular Disease  negative cardio ROS Normal cardiovascular exam Rhythm:Regular Rate:Normal     Neuro/Psych PSYCHIATRIC DISORDERS Anxiety negative neurological ROS     GI/Hepatic negative GI ROS, Neg liver ROS,   Endo/Other  negative endocrine ROS  Renal/GU negative Renal ROS  negative genitourinary   Musculoskeletal  (+) Arthritis ,   Abdominal   Peds negative pediatric ROS (+)  Hematology negative hematology ROS (+)   Anesthesia Other Findings   Reproductive/Obstetrics negative OB ROS                            Anesthesia Physical Anesthesia Plan  ASA: II  Anesthesia Plan: General   Post-op Pain Management:    Induction: Intravenous  Airway Management Planned: LMA  Additional Equipment:   Intra-op Plan:   Post-operative Plan: Extubation in OR  Informed Consent: I have reviewed the patients History and Physical, chart, labs and discussed the procedure including the risks, benefits and alternatives for the proposed anesthesia with the patient or authorized representative who has indicated his/her understanding and acceptance.   Dental advisory given  Plan Discussed with: CRNA  Anesthesia Plan Comments:         Anesthesia Quick Evaluation

## 2015-09-12 NOTE — Discharge Instructions (Addendum)

## 2015-09-13 ENCOUNTER — Encounter (HOSPITAL_BASED_OUTPATIENT_CLINIC_OR_DEPARTMENT_OTHER): Payer: Self-pay | Admitting: Orthopedic Surgery

## 2015-09-13 NOTE — Op Note (Signed)
Doris Bender, Doris Bender NO.:  [id number]  MEDICAL RECORD NO.:  [id number]  LOCATION:                                 FACILITY:  PHYSICIAN:  Daryll Brod, M.D.            DATE OF BIRTH:  DATE OF PROCEDURE:  09/12/2015 DATE OF DISCHARGE:                              OPERATIVE REPORT   PREOPERATIVE DIAGNOSIS:  Lateral epicondylitis, left elbow.  POSTOPERATIVE DIAGNOSIS:  Lateral epicondylitis, left elbow.  OPERATION:  Reconstruction of extensor origin with debridement frond, partial synovectomy of radiocapitellar joint, left elbow.  SURGEON:  Daryll Brod, M.D.  ANESTHESIA:  General with local infiltration.  ANESTHESIOLOGIST:  Dr. Jillyn Hidden.  HISTORY:  The patient is a 55 year old female with a history of lateral epicondylitis, this has gone on for years.  She has had an MRI confirming loss of the origin of the extensor musculature, left elbow. She is desirous to proceeding to have this reconstructed after failure of conservative treatment.  Pre, peri, and postoperative diagnoses and procedures have been discussed.  She is aware that there was no guarantee with the surgery; the possibility of infection; recurrence of injury to arteries, nerves, tendons; incomplete relief of symptoms; and dystrophy.  All discussed with the patient.  In the preoperative area, the patient is seen, the extremity marked by both patient and surgeon and antibiotic given.  PROCEDURE IN DETAIL:  The patient was brought to the operating room, a general anesthetic was carried out without difficulty.  She was prepped using ChloraPrep, supine position with the left arm free.  A 3-minute dry time was allowed.  Time-out taken, confirmed the patient and procedure.  The limb was exsanguinated with an Esmarch bandage. Tourniquet placed high on the arm was inflated to 250 mmHg.  An incision was made longitudinally over the lateral epicondyle over the left elbow, carried down through the subcutaneous  tissue.  Bleeders were electrocauterized.  An incision was then made to allow opening of the joint, this was carried down with blunt and sharp dissection to the radiocarpal joint, which was opened.  A large frond of tissue was present posteriorly, this was excised with sharp dissection and a rongeur.  The radiocapitellar joint showed no significant degenerative changes otherwise.  This arthrotomy into the joint was then irrigated and closed with figure-of-eight 2-0 Vicryl sutures.  A separate incision in the lateral epicondyle musculature was made anterior to the axis of rotation to the joint, carried down through subcutaneous musculature.  A large area of degeneration was then immediately apparent with the central portion entirely avulsed from the lateral epicondyle bone.  The bone was roughened.  The area of granulation fibroadenomatous tissue was removed with a rongeur back to normal tendon origin.  The bone was roughened.  It was decided due to her small stature and minimal subcutaneous tissue, that we would place anchors, two 1.1-mm JuggerKnot anchors were attempted to be placed, both failed.  One bent, the other one broke.  X-rays were taken to confirm that there was no portion of the metal left in the bone or around the joint, this was confirmed on x- ray.  1.4-mm anchors  were then used to replace without difficulty into the lateral epicondyle.  The care was taken to aim the slightly posterior so as not to enter the joint anteriorly.  The two anchors were then placed.  The area was irrigated.  The sutures were then used to close the defect suturing it back down to the roughened area of the lateral epicondyle burying the knots within the extensor origin.  The remainder of the origin was then repaired with figure-of-eight 2-0 Vicryl sutures.  The subcutaneous tissue was closed with interrupted 2-0 Vicryl and the skin with interrupted 4-0 nylon sutures.  Local infiltration with 0.25%  bupivacaine without epinephrine was given.  A sterile compressive dressing, long-arm splint was then applied.  On deflation of the tourniquet, all fingers were immediately pinked.  She was taken to the recovery room for observation in satisfactory condition.  She will be discharged to home to return to the [facility] in 1 week, on hydrocodone.          ______________________________ Daryll Brod, M.D.     GK/MEDQ  D:  09/12/2015  T:  09/13/2015  Job:  [id number]

## 2015-10-21 ENCOUNTER — Encounter (HOSPITAL_BASED_OUTPATIENT_CLINIC_OR_DEPARTMENT_OTHER): Payer: Self-pay | Admitting: Orthopedic Surgery

## 2016-01-05 ENCOUNTER — Emergency Department (HOSPITAL_COMMUNITY): Payer: BLUE CROSS/BLUE SHIELD

## 2016-01-05 ENCOUNTER — Emergency Department (HOSPITAL_COMMUNITY)
Admission: EM | Admit: 2016-01-05 | Discharge: 2016-01-05 | Disposition: A | Discharge status: Home / Self Care | Payer: BLUE CROSS/BLUE SHIELD | Attending: Emergency Medicine | Admitting: Emergency Medicine

## 2016-01-05 ENCOUNTER — Encounter (HOSPITAL_COMMUNITY): Payer: Self-pay

## 2016-01-05 DIAGNOSIS — Z79899 Other long term (current) drug therapy: Secondary | ICD-10-CM | POA: Diagnosis not present

## 2016-01-05 DIAGNOSIS — R1032 Left lower quadrant pain: Secondary | ICD-10-CM | POA: Diagnosis present

## 2016-01-05 DIAGNOSIS — F17210 Nicotine dependence, cigarettes, uncomplicated: Secondary | ICD-10-CM | POA: Insufficient documentation

## 2016-01-05 DIAGNOSIS — R1084 Generalized abdominal pain: Secondary | ICD-10-CM | POA: Insufficient documentation

## 2016-01-05 DIAGNOSIS — Z853 Personal history of malignant neoplasm of breast: Secondary | ICD-10-CM | POA: Diagnosis not present

## 2016-01-05 DIAGNOSIS — R109 Unspecified abdominal pain: Secondary | ICD-10-CM

## 2016-01-05 LAB — CBC
HCT: 44.6 % (ref 36.0–46.0)
Hemoglobin: 14.1 g/dL (ref 12.0–15.0)
MCH: 29.4 pg (ref 26.0–34.0)
MCHC: 31.6 g/dL (ref 30.0–36.0)
MCV: 93.1 fL (ref 78.0–100.0)
PLATELETS: 231 10*3/uL (ref 150–400)
RBC: 4.79 MIL/uL (ref 3.87–5.11)
RDW: 12.9 % (ref 11.5–15.5)
WBC: 4.9 10*3/uL (ref 4.0–10.5)

## 2016-01-05 LAB — URINALYSIS, ROUTINE W REFLEX MICROSCOPIC
Bilirubin Urine: NEGATIVE
Glucose, UA: NEGATIVE mg/dL
Hgb urine dipstick: NEGATIVE
KETONES UR: NEGATIVE mg/dL
LEUKOCYTES UA: NEGATIVE
NITRITE: NEGATIVE
PROTEIN: NEGATIVE mg/dL
Specific Gravity, Urine: 1.008 (ref 1.005–1.030)
pH: 6 (ref 5.0–8.0)

## 2016-01-05 LAB — COMPREHENSIVE METABOLIC PANEL
ALK PHOS: 83 U/L (ref 38–126)
ALT: 12 U/L — AB (ref 14–54)
AST: 15 U/L (ref 15–41)
Albumin: 4.3 g/dL (ref 3.5–5.0)
Anion gap: 6 (ref 5–15)
BUN: 9 mg/dL (ref 6–20)
CALCIUM: 9.2 mg/dL (ref 8.9–10.3)
CO2: 28 mmol/L (ref 22–32)
CREATININE: 0.82 mg/dL (ref 0.44–1.00)
Chloride: 104 mmol/L (ref 101–111)
Glucose, Bld: 101 mg/dL — ABNORMAL HIGH (ref 65–99)
Potassium: 4.5 mmol/L (ref 3.5–5.1)
Sodium: 138 mmol/L (ref 135–145)
TOTAL PROTEIN: 6.8 g/dL (ref 6.5–8.1)
Total Bilirubin: 0.7 mg/dL (ref 0.3–1.2)

## 2016-01-05 LAB — LIPASE, BLOOD: Lipase: 66 U/L — ABNORMAL HIGH (ref 11–51)

## 2016-01-05 MED ORDER — HYDROCODONE-ACETAMINOPHEN 5-325 MG PO TABS
1.0000 | ORAL_TABLET | Freq: Once | ORAL | Status: AC
  Administered 2016-01-05: 1 via ORAL
  Filled 2016-01-05: qty 1

## 2016-01-05 MED ORDER — HYDROCODONE-ACETAMINOPHEN 5-325 MG PO TABS: 1.0000 | ORAL_TABLET | Freq: Four times a day (QID) | ORAL | 0 refills | Status: DC | PRN

## 2016-01-05 MED ORDER — FENTANYL CITRATE (PF) 100 MCG/2ML IJ SOLN
50.0000 ug | Freq: Once | INTRAMUSCULAR | Status: AC
  Administered 2016-01-05: 50 ug via INTRAVENOUS
  Filled 2016-01-05: qty 2

## 2016-01-05 MED ORDER — SODIUM CHLORIDE 0.9 % IV BOLUS (SEPSIS)
1000.0000 mL | Freq: Once | INTRAVENOUS | Status: AC
  Administered 2016-01-05: 1000 mL via INTRAVENOUS

## 2016-01-05 NOTE — Discharge Instructions (Signed)
Read the information below.   Use the prescribed medication as directed.  Please discuss all new medications with your pharmacist.  You may return to the Emergency Department at any time for worsening condition or any new symptoms that concern you. Results for orders placed or performed during the hospital encounter of 01/05/16  Lipase, blood  Result Value Ref Range   Lipase 66 (H) 11 - 51 U/L  Comprehensive metabolic panel  Result Value Ref Range   Sodium 138 135 - 145 mmol/L   Potassium 4.5 3.5 - 5.1 mmol/L   Chloride 104 101 - 111 mmol/L   CO2 28 22 - 32 mmol/L   Glucose, Bld 101 (H) 65 - 99 mg/dL   BUN 9 6 - 20 mg/dL   Creatinine, Ser 0.82 0.44 - 1.00 mg/dL   Calcium 9.2 8.9 - 10.3 mg/dL   Total Protein 6.8 6.5 - 8.1 g/dL   Albumin 4.3 3.5 - 5.0 g/dL   AST 15 15 - 41 U/L   ALT 12 (L) 14 - 54 U/L   Alkaline Phosphatase 83 38 - 126 U/L   Total Bilirubin 0.7 0.3 - 1.2 mg/dL   GFR calc non Af Amer >60 >60 mL/min   GFR calc Af Amer >60 >60 mL/min   Anion gap 6 5 - 15  CBC  Result Value Ref Range   WBC 4.9 4.0 - 10.5 K/uL   RBC 4.79 3.87 - 5.11 MIL/uL   Hemoglobin 14.1 12.0 - 15.0 g/dL   HCT 44.6 36.0 - 46.0 %   MCV 93.1 78.0 - 100.0 fL   MCH 29.4 26.0 - 34.0 pg   MCHC 31.6 30.0 - 36.0 g/dL   RDW 12.9 11.5 - 15.5 %   Platelets 231 150 - 400 K/uL  Urinalysis, Routine w reflex microscopic  Result Value Ref Range   Color, Urine YELLOW YELLOW   APPearance CLEAR CLEAR   Specific Gravity, Urine 1.008 1.005 - 1.030   pH 6.0 5.0 - 8.0   Glucose, UA NEGATIVE NEGATIVE mg/dL   Hgb urine dipstick NEGATIVE NEGATIVE   Bilirubin Urine NEGATIVE NEGATIVE   Ketones, ur NEGATIVE NEGATIVE mg/dL   Protein, ur NEGATIVE NEGATIVE mg/dL   Nitrite NEGATIVE NEGATIVE   Leukocytes, UA NEGATIVE NEGATIVE   Ct Renal Stone Study  Result Date: 01/05/2016 CLINICAL DATA:  Left-sided abdominal pain, history of kidney stones EXAM: CT ABDOMEN AND PELVIS WITHOUT CONTRAST TECHNIQUE: Multidetector CT imaging  of the abdomen and pelvis was performed following the standard protocol without IV contrast. COMPARISON:  07/05/2015 FINDINGS: Lower chest:  Lung bases are clear. Hepatobiliary: Unenhanced liver is unremarkable. Gallbladder is unremarkable. No intrahepatic or extrahepatic ductal dilatation. Pancreas: Within normal limits. Spleen: Within normal limits. Adrenals/Urinary Tract: Adrenal glands within normal limits. Kidneys are within normal limits. No renal, ureteral, or bladder calculi.  No hydronephrosis. Bladder is within normal limits. Stomach/Bowel: Stomach is within normal limits. No evidence of bowel obstruction. Normal appendix (series 2/image 58). Vascular/Lymphatic: No evidence of abdominal aortic aneurysm. Atherosclerotic calcifications of the abdominal aorta and branch vessels. No suspicious abdominopelvic lymphadenopathy. Reproductive: Uterus is within normal limits. No adnexal masses. Other: No abdominopelvic ascites. Musculoskeletal: Mild superior endplate compression fracture deformity at L4, unchanged. IMPRESSION: No renal, ureteral, or bladder calculi.  No hydronephrosis. No evidence of bowel obstruction.  Normal appendix. No CT findings to account for the patient's left flank pain. Electronically Signed   By: Julian Hy M.D.   On: 01/05/2016 13:31

## 2016-01-05 NOTE — ED Provider Notes (Signed)
[facility] DEPT Provider Note   CSN: [id number] Arrival date & time: 01/05/16  1008     History   Chief Complaint Chief Complaint  Patient presents with  . Abdominal Pain    HPI Doris Bender is a 55 y.o. female.  Doris Bender is a 55 y.o. female with history of breast cancer, arthritis, osteoporosis, and raynaud's presents to the emergency department with abdominal pain. Patient states she started having left lower quadrant abdominal pain this morning and has progressively worsened. She describes as a constant aching sensation with radiation into her left flank and back. Movement makes the pain worse. She denies fever, chills, diaphoresis, nausea, vomiting, constipation, diarrhea, blood in stool, dysuria, hematuria, vaginal pain, vaginal bleeding, vaginal discharge. No loss of bowel or bladder function. She has not taken anything for her symptoms. She denies any history of kidney stones or abdominal conditions. She has never had a colonoscopy.      Past Medical History:  Diagnosis Date  . Arthritis    lt great toe (RA)  . Breast cancer ([place]) 11/15/12   left  . Cancer ([place])    breast  . Compression fracture of L4 lumbar vertebra ([place]) 11/04/2012   injury  . Contact lens/glasses fitting    wears contacts or glasses  . Gout 8-9 yrs ago   Rt heel/foot  . History of radiation therapy 02/20/13-04/07/13   left breast 50.4 gray, upper outer quadrant boosted to 62.4 gray  . Osteoporosis   . Pseudogout of joint of left foot 6 yrs ago  . Raynaud's disease    bilateral hands and feet  . Wears partial dentures    partial lower    Patient Active Problem List   Diagnosis Date Noted  . Back pain 07/24/2013  . Compression fracture of L4 lumbar vertebra ([place]) 07/24/2013  . Breast cancer of upper-outer quadrant of left female breast ([place]) 10/18/2012    Past Surgical History:  Procedure Laterality Date  . BREAST LUMPECTOMY WITH NEEDLE LOCALIZATION AND AXILLARY SENTINEL LYMPH  NODE BX Left 12/21/2012   Procedure: BREAST LUMPECTOMY WITH NEEDLE LOCALIZATION AND AXILLARY SENTINEL LYMPH NODE BX;  Surgeon: Haywood Lasso, MD;  Location: [facility];  Service: General;  Laterality: Left;  needle local BCG    . BREAST SURGERY  age 34   left breast bx- benign  . CERVICAL CONIZATION W/BX  2012   benign  . KNEE ARTHROSCOPY  2006   right  . LATERAL EPICONDYLE RELEASE  03/08/2012   Procedure: TENNIS ELBOW RELEASE;  Surgeon: Cammie Sickle., MD;  Location: [facility];  Service: Orthopedics;  Laterality: Right;  RECONSTRUCTION OF RIGHT LATERAL ELBOW WITH TENDONS  . MASS EXCISION  03/08/2012   Procedure: EXCISION MASS;  Surgeon: Cammie Sickle., MD;  Location: [facility];  Service: Orthopedics;  Laterality: Right;  EXCISION OF RIGHT WRIST DORSAL CYST  . TENNIS ELBOW RELEASE/NIRSCHEL PROCEDURE Left 09/12/2015   Procedure: ARTHROTOMY LEFT ELBOW, REPAIR EXTENSOR ORIGIN;  Surgeon: Daryll Brod, MD;  Location: [facility];  Service: Orthopedics;  Laterality: Left;  . TONSILLECTOMY    . TONSILLECTOMY     55yrs old  . WRIST GANGLION EXCISION     left x2    OB History    Gravida Para Term Preterm AB Living   0 0 0 0 0     SAB TAB Ectopic Multiple Live Births   0 0 0  Home Medications    Prior to Admission medications   Medication Sig Start Date End Date Taking? Authorizing Provider  ALPRAZolam (XANAX) 0.25 MG tablet Take 0.25 mg by mouth at bedtime.  06/05/15  Yes Historical Provider, MD  HYDROcodone-acetaminophen (NORCO) 5-325 MG tablet Take 1 tablet by mouth every 6 (six) hours as needed for moderate pain. 01/05/16   Roxanna Mew, PA-C  naproxen sodium (ALEVE) 220 MG tablet Take 220 mg by mouth daily as needed (headache).    Historical Provider, MD  traMADol (ULTRAM) 50 MG tablet Take 1 tablet (50 mg total) by mouth every 6 (six) hours as needed. Patient not taking: Reported on 01/05/2016 06/16/15    Dorie Rank, MD    Family History Family History  Problem Relation Age of Onset  . Hyperthyroidism Mother   . Cirrhosis Father   . Diabetes Brother   . Heart attack Maternal Grandmother   . Heart attack Maternal Grandfather     Social History Social History  Substance Use Topics  . Smoking status: Current Every Day Smoker    Packs/day: 1.00    Years: 20.00    Types: Cigarettes  . Smokeless tobacco: Never Used     Comment: 1ppd or less  . Alcohol use Yes     Comment: rare     Allergies   Ibuprofen; Keflex [cephalexin]; Ketorolac; Lodine [etodolac]; Methocarbamol; Oxycodone; Oxycodone; and Robaxin [methocarbamol]   Review of Systems Review of Systems  Constitutional: Negative for chills, diaphoresis and fever.  HENT: Negative for trouble swallowing.   Eyes: Negative for visual disturbance.  Respiratory: Negative for shortness of breath.   Cardiovascular: Negative for chest pain.  Gastrointestinal: Positive for abdominal pain. Negative for blood in stool, constipation, diarrhea, nausea and vomiting.  Genitourinary: Positive for flank pain. Negative for dysuria, hematuria, vaginal bleeding, vaginal discharge and vaginal pain.  Musculoskeletal: Positive for back pain. Negative for neck pain.  Skin: Negative for rash.  Neurological: Negative for syncope.     Physical Exam Updated Vital Signs BP 124/74 (BP Location: Right Arm)   Pulse 62   Temp 98.1 F (36.7 C) (Oral)   Resp 18   Ht 5\' 5"  (1.651 m)   Wt 58.4 kg   SpO2 99%   BMI 21.42 kg/m   Physical Exam  Constitutional: She appears well-developed and well-nourished. She appears distressed.  HENT:  Head: Normocephalic and atraumatic.  Mouth/Throat: Uvula is midline and oropharynx is clear and moist. Mucous membranes are dry. No trismus in the jaw. No oropharyngeal exudate.  Eyes: Conjunctivae and EOM are normal. Pupils are equal, round, and reactive to light. Right eye exhibits no discharge. Left eye exhibits no  discharge. No scleral icterus.  Neck: Normal range of motion and phonation normal. Neck supple. No neck rigidity. Normal range of motion present.  Cardiovascular: Normal rate, regular rhythm, normal heart sounds and intact distal pulses.   No murmur heard. Pulmonary/Chest: Effort normal and breath sounds normal. No stridor. No respiratory distress. She has no wheezes. She has no rales.  Abdominal: Soft. Bowel sounds are normal. She exhibits no distension. There is tenderness in the suprapubic area, left upper quadrant and left lower quadrant. There is guarding. There is no rigidity, no rebound and no CVA tenderness.  TTP of LUQ and LLQ with mild guarding. No rigidity or peritoneal signs. No CVA tenderness.  Musculoskeletal: Normal range of motion.  No obvious deformity of spine. No TTP of C-, T-, L- spine. No TTP of paravertebral muscles. No  step off.   Lymphadenopathy:    She has no cervical adenopathy.  Neurological: She is alert. She has normal strength. She is not disoriented. No sensory deficit. Coordination and gait normal. GCS eye subscore is 4. GCS verbal subscore is 5. GCS motor subscore is 6.  Skin: Skin is warm and dry. No rash noted. She is not diaphoretic.  No rash noted.   Psychiatric: She has a normal mood and affect. Her behavior is normal.     ED Treatments / Results  Labs (all labs ordered are listed, but only abnormal results are displayed) Labs Reviewed  LIPASE, BLOOD - Abnormal; Notable for the following:       Result Value   Lipase 66 (*)    All other components within normal limits  COMPREHENSIVE METABOLIC PANEL - Abnormal; Notable for the following:    Glucose, Bld 101 (*)    ALT 12 (*)    All other components within normal limits  CBC  URINALYSIS, ROUTINE W REFLEX MICROSCOPIC (NOT AT [facility])    EKG  EKG Interpretation None       Radiology Ct Renal Stone Study  Result Date: 01/05/2016 CLINICAL DATA:  Left-sided abdominal pain, history of kidney stones  EXAM: CT ABDOMEN AND PELVIS WITHOUT CONTRAST TECHNIQUE: Multidetector CT imaging of the abdomen and pelvis was performed following the standard protocol without IV contrast. COMPARISON:  07/05/2015 FINDINGS: Lower chest:  Lung bases are clear. Hepatobiliary: Unenhanced liver is unremarkable. Gallbladder is unremarkable. No intrahepatic or extrahepatic ductal dilatation. Pancreas: Within normal limits. Spleen: Within normal limits. Adrenals/Urinary Tract: Adrenal glands within normal limits. Kidneys are within normal limits. No renal, ureteral, or bladder calculi.  No hydronephrosis. Bladder is within normal limits. Stomach/Bowel: Stomach is within normal limits. No evidence of bowel obstruction. Normal appendix (series 2/image 58). Vascular/Lymphatic: No evidence of abdominal aortic aneurysm. Atherosclerotic calcifications of the abdominal aorta and branch vessels. No suspicious abdominopelvic lymphadenopathy. Reproductive: Uterus is within normal limits. No adnexal masses. Other: No abdominopelvic ascites. Musculoskeletal: Mild superior endplate compression fracture deformity at L4, unchanged. IMPRESSION: No renal, ureteral, or bladder calculi.  No hydronephrosis. No evidence of bowel obstruction.  Normal appendix. No CT findings to account for the patient's left flank pain. Electronically Signed   By: Julian Hy M.D.   On: 01/05/2016 13:31    Procedures Procedures (including critical care time)  Medications Ordered in ED Medications  sodium chloride 0.9 % bolus 1,000 mL (0 mLs Intravenous Stopped 01/05/16 1300)  fentaNYL (SUBLIMAZE) injection 50 mcg (50 mcg Intravenous Given 01/05/16 1208)  fentaNYL (SUBLIMAZE) injection 50 mcg (50 mcg Intravenous Given 01/05/16 1424)  HYDROcodone-acetaminophen (NORCO/VICODIN) 5-325 MG per tablet 1 tablet (1 tablet Oral Given 01/05/16 1620)     Initial Impression / Assessment and Plan / ED Course  I have reviewed the triage vital signs and the nursing  notes.  Pertinent labs & imaging results that were available during my care of the patient were reviewed by me and considered in my medical decision making (see chart for details).  Clinical Course  Value Comment By Time  CT Renal Stone Study Reviewed Roxanna Mew, PA-C 09/03 1400    Patient presents to ED complaint of LLQ pain. Patient is afebrile and non-toxic appearing. She is constantly moving in the bed holding her left lower abdomen, unable to find a comfortable position. VSS. Physical exam remarkable for tenderness to palpation of her left lower quadrant and left upper quadrant with mild guarding. No peritoneal signs. Positive  bowel sounds. Abdomen soft. Patient has 2+ equal distal pulses b/l, vital signs are stable. No rashes noted. No TTP of spine or paravertebral muscles. Patient is ambulatory with steady gait. Concern for possible diverticulitis versus nephrolithiasis. Will check labs and possible CT of abdomen/pelvis. IVF and pain medication given.  CBC re-assuring. U/A negative for UTI, low suspicion for pyelonephritis. CMP reassuring - low suspicion for acute cholecystitis. Lipase mildly elevated; however, low suspicion for pancreatitis - afebrile, no leukocytosis, no TTP in epigastric, no N/V, patient able to eat and drink. CT abdomen/pelvis shows no acute abnormality - normal appearing pancreas/GB/spleen/appendix/stomach/bowel, no obstruction or perforation, no evidence of nephrolithiasis/hydronephrosis, no AAA, nml reproductive structures.  No acute abdomen. Unknown etiology of left sided abdominal/flank/back pain - ?possible MSK vs. ?nerve pain.   Discussed results and plan with patient. Symptomatic management discussed. Rx pain medication. Encouraged follow up with PCP next week. Strict return precautions given. Patient voiced understanding and is agreeable.   Final Clinical Impressions(s) / ED Diagnoses   Final diagnoses:  Abdominal pain, unspecified abdominal location     New Prescriptions Discharge Medication List as of 01/05/2016  4:29 PM       Roxanna Mew, PA-C 01/06/16 [place], MD 01/06/16 1743

## 2016-01-05 NOTE — ED Notes (Signed)
Patient returned from CT

## 2016-01-05 NOTE — ED Triage Notes (Signed)
Patient here with LLQ pain since awakening this am. States that her side is painful to touch. No nausea, no vomiting, normal BM this am

## 2016-01-05 NOTE — ED Notes (Signed)
Patient transported to CT 

## 2016-03-25 IMAGING — CT CT ABD-PELV W/ CM
2 of 5 series · 16 of 46 positions shown, 18 images · IV contrast (Omnipaque 300)
Comparison: None.

CLINICAL DATA: Bilateral mid abdominal pain radiating to the left
side for 12 hours.

EXAM:
CT ABDOMEN AND PELVIS WITH CONTRAST
TECHNIQUE: Multidetector CT imaging of the abdomen and pelvis was performed
using the standard protocol following bolus administration of
intravenous contrast.
CONTRAST:  100mL OMNIPAQUE IOHEXOL 300 MG/ML SOLN, 50mL OMNIPAQUE
IOHEXOL 300 MG/ML SOLN

[Series 2: abd_pel_with 5.0 b40f · axial · 0.63mm/px · z∈[-422,-42]mm · 13 of 86 slices shown, 15 images]
[im 5/86  soft-tissue]
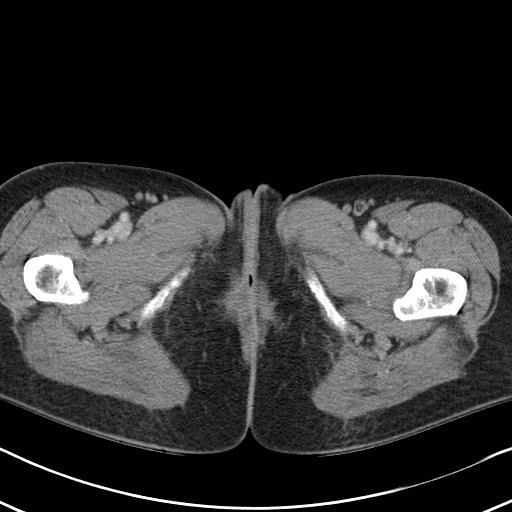
[im 5/86  bone]
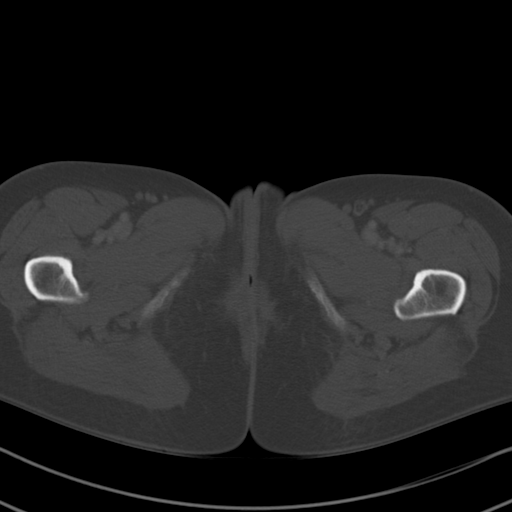
[im 14/86  soft-tissue]
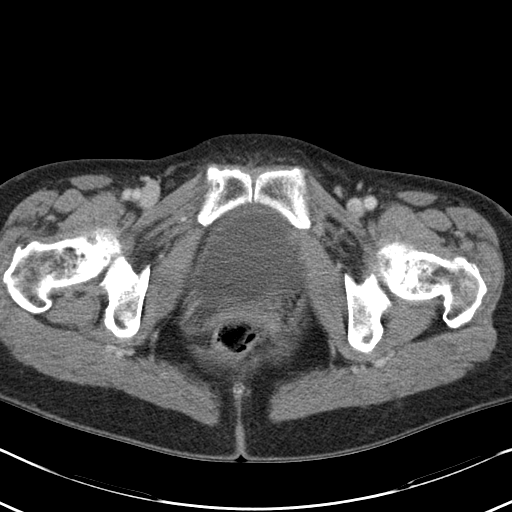
[im 18/86  soft-tissue]
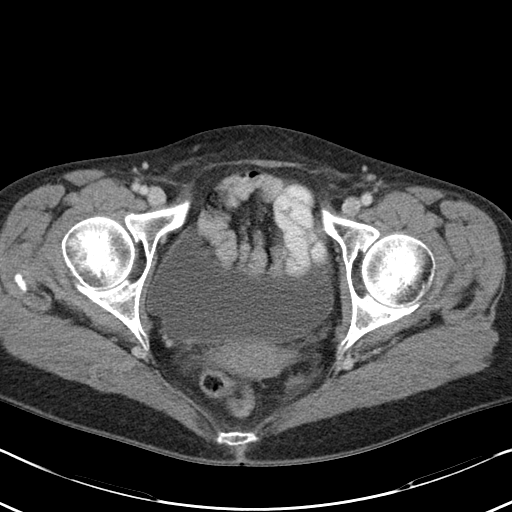
[im 23/86  soft-tissue]
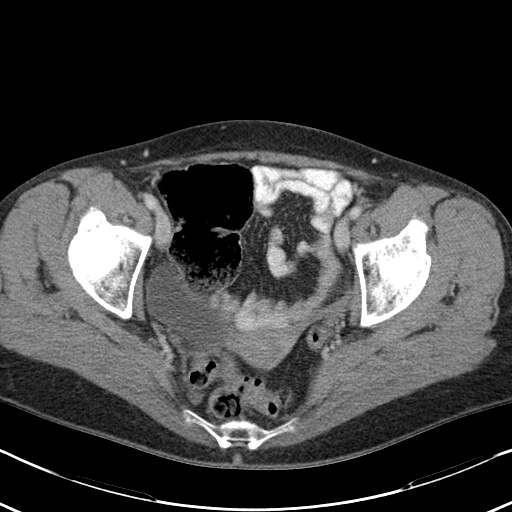
[im 32/86  soft-tissue]
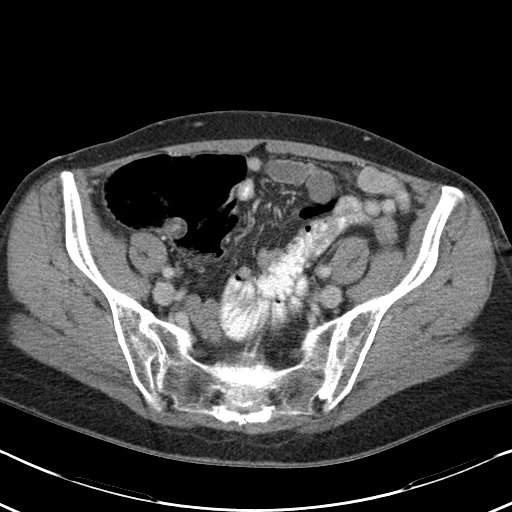
[im 36/86  soft-tissue]
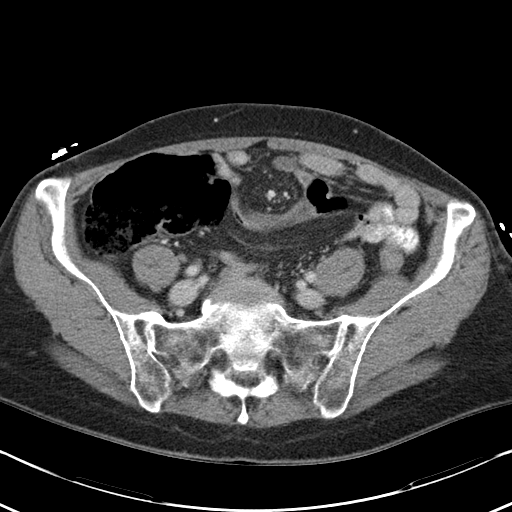
[im 45/86  soft-tissue]
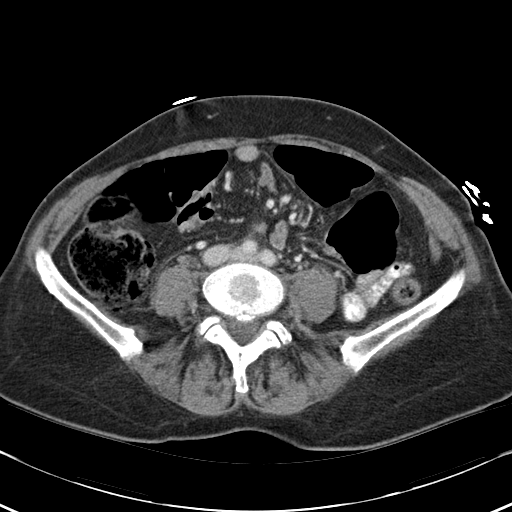
[im 50/86  soft-tissue]
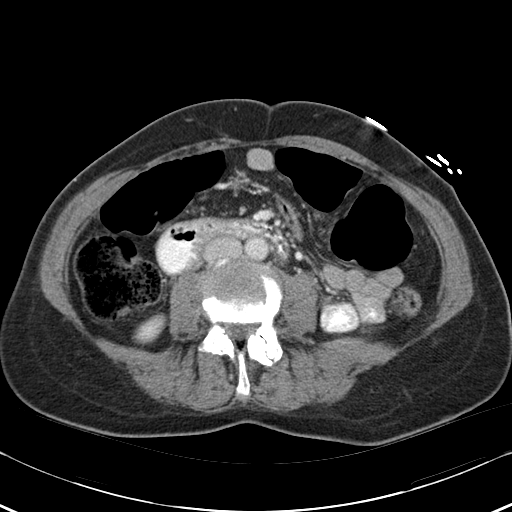
[im 54/86  soft-tissue]
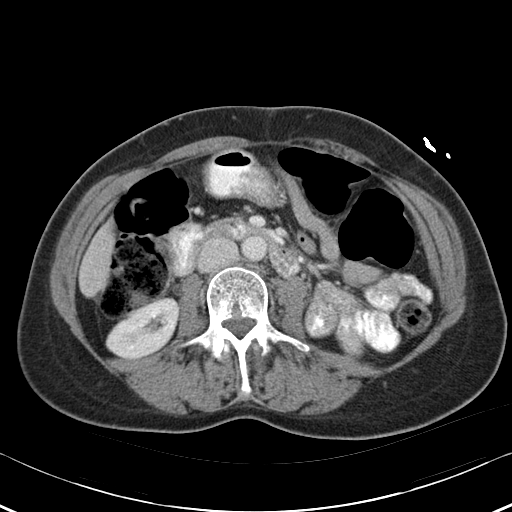
[im 54/86  bone]
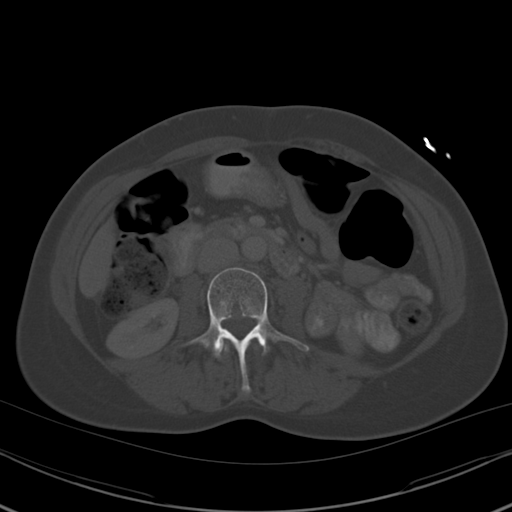
[im 63/86  soft-tissue]
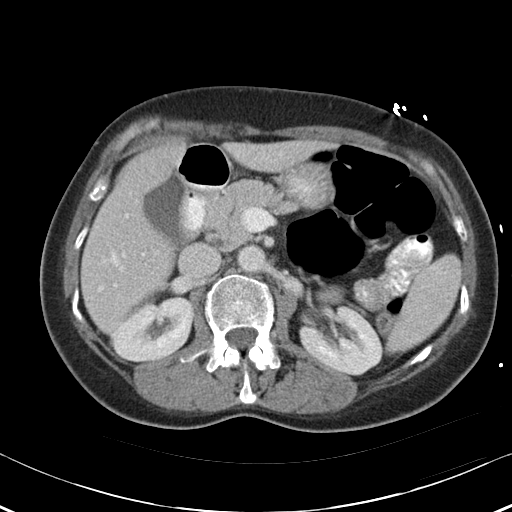
[im 68/86  soft-tissue]
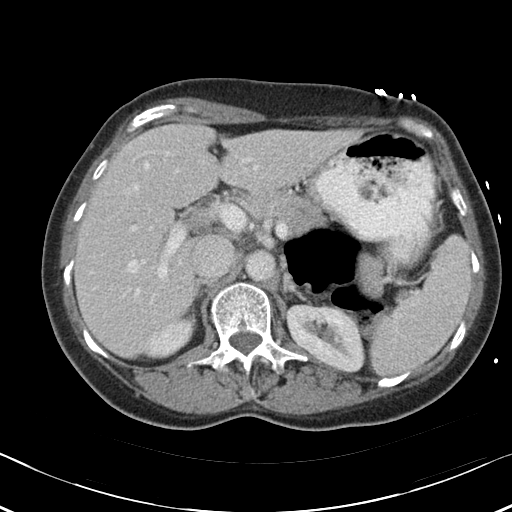
[im 72/86  soft-tissue]
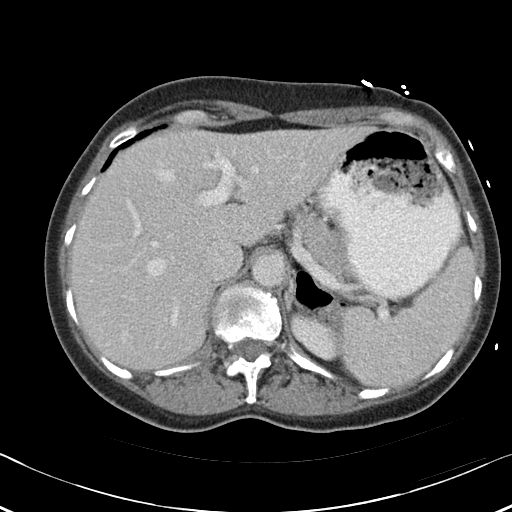
[im 81/86  soft-tissue]
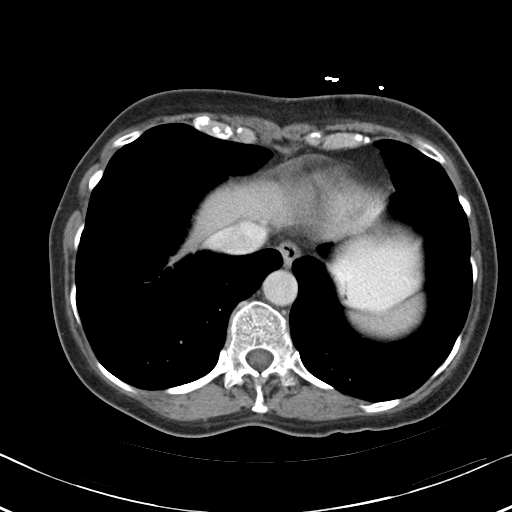

[Series 3: abd_pel_with 3.0 spo cor · coronal · 0.63mm/px · 3 of 74 slices shown]
[im 25/74  soft-tissue]
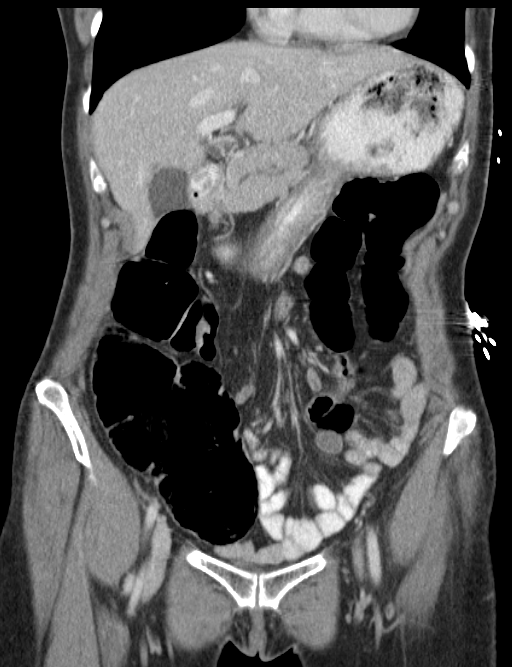
[im 33/74  soft-tissue]
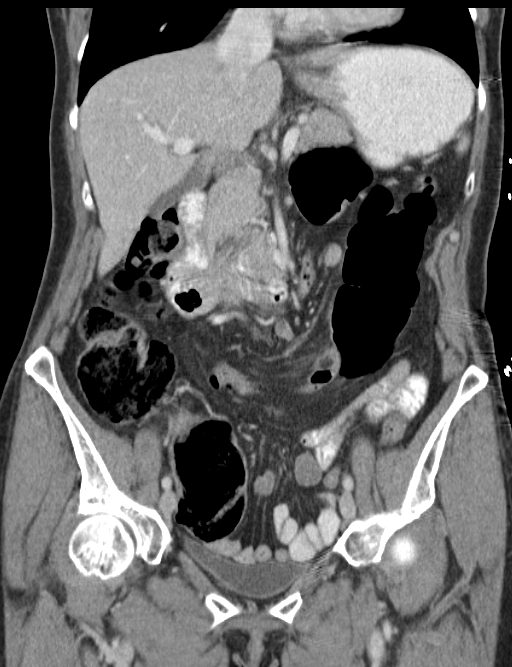
[im 41/74  soft-tissue]
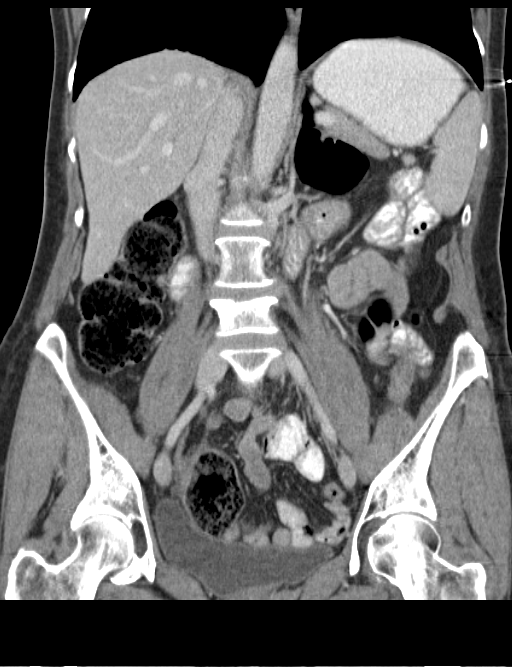

[16 of 46 positions shown; findings below may reference images not displayed]

FINDINGS: The lung bases are clear.

The liver, spleen, gallbladder, pancreas, adrenal glands, kidneys,
abdominal aorta, inferior vena cava, and retroperitoneal lymph nodes
are unremarkable. Retro aortic left renal vein. Small accessory
spleen. Stomach and small bowel are not abnormally distended. Gas
and stool within the colon. No colonic distention or wall
thickening. Descending colon is decompressed. No free air or free
fluid in the abdomen. Abdominal wall musculature appears intact.

Pelvis: Uterus and ovaries are not enlarged. No pelvic mass or
lymphadenopathy. Bladder wall is not thickened. No evidence of
diverticulitis. Appendix is normal. No inflammatory changes in the
right lower quadrant. Compression fracture of the L4 vertebrae
superiorly. This was present on previous MRI from 08/17/2013. No
destructive bone lesions.
IMPRESSION: No focal acute abnormality is identified to account for the
patient's pain.

## 2017-07-05 ENCOUNTER — Encounter (INDEPENDENT_AMBULATORY_CARE_PROVIDER_SITE_OTHER): Payer: Self-pay | Admitting: Orthopedic Surgery

## 2017-07-05 ENCOUNTER — Ambulatory Visit (INDEPENDENT_AMBULATORY_CARE_PROVIDER_SITE_OTHER): Payer: Commercial Managed Care - PPO | Admitting: Orthopedic Surgery

## 2017-07-05 ENCOUNTER — Ambulatory Visit (INDEPENDENT_AMBULATORY_CARE_PROVIDER_SITE_OTHER): Payer: Self-pay

## 2017-07-05 DIAGNOSIS — G8929 Other chronic pain: Secondary | ICD-10-CM

## 2017-07-05 DIAGNOSIS — M25562 Pain in left knee: Secondary | ICD-10-CM | POA: Diagnosis not present

## 2017-07-05 DIAGNOSIS — M25561 Pain in right knee: Secondary | ICD-10-CM | POA: Diagnosis not present

## 2017-07-05 DIAGNOSIS — M79672 Pain in left foot: Secondary | ICD-10-CM | POA: Diagnosis not present

## 2017-07-05 MED ORDER — BUPIVACAINE HCL 0.25 % IJ SOLN
4.0000 mL | INTRAMUSCULAR | Status: AC | PRN
  Administered 2017-07-05: 4 mL via INTRA_ARTICULAR

## 2017-07-05 MED ORDER — METHYLPREDNISOLONE ACETATE 40 MG/ML IJ SUSP
40.0000 mg | INTRAMUSCULAR | Status: AC | PRN
  Administered 2017-07-05: 40 mg via INTRA_ARTICULAR

## 2017-07-05 MED ORDER — LIDOCAINE HCL 1 % IJ SOLN
5.0000 mL | INTRAMUSCULAR | Status: AC | PRN
  Administered 2017-07-05: 5 mL

## 2017-07-05 NOTE — Progress Notes (Signed)
Office Visit Note   Patient: Doris Bender           Date of Birth: 11-Jan-1961           MRN: [id number] Visit Date: 07/05/2017 Requested by: Rory Percy, MD [place], [place] [address] PCP: Rory Percy, MD  Subjective: Chief Complaint  Patient presents with  . Right Knee - Pain  . Left Knee - Pain  . Left Foot - Pain    HPI: Doris Bender is a 57 year old patient with bilateral knee pain left worse than right along with left foot pain.  She states that 4 nights ago she was trying to get a bed and she had the left knee lock up to the point where she could hardly bend it.  She has a known history of rheumatoid arthritis but currently is not taking any medication.  She has a rheumatologist in [place] but has not seen him in a long time because he is too far away.  She does have an allergy to ibuprofen but can take Aleve.  She reports painful weightbearing on that left foot without any history of trauma.  She does not report any trauma affecting either knee.  She does have a history of right knee arthroscopy with what sounds like partial medial meniscectomy years ago.  Has not had left knee surgery.              ROS: All systems reviewed are negative as they relate to the chief complaint within the history of present illness.  Patient denies  fevers or chills.   Assessment & Plan: Visit Diagnoses:  1. Chronic pain of both knees   2. 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  We talked about a insert which would diminish the dorsiflexion moment at the MTP joint.  That would be somewhat expensive so she may want to hold off on that.  Otherwise this is just going to be waiting it out until she gets effusion.  I do not think MTP joint replacement is necessarily indicated at this time.  That would be a decision I would leave to our foot and ankle specialist Dr. Sharol Given.  In regards  to both knees I think that she has worse radiographic arthritis in the right knee compared to the left but her left knee is currently more symptomatic.  No effusion in the left knee today.  No definite mechanical symptoms.  I would favor 1 Aleve twice a day for 7 days along with cortisone injection which is performed today.  See her back as needed.  MRI scanning next step to look for arthroscopically treatable operative pathology.  Follow-Up Instructions: Return if symptoms worsen or fail to improve.   Orders:  Orders Placed This Encounter  Procedures  . XR Foot Complete Left  . XR KNEE 3 VIEW LEFT  . XR KNEE 3 VIEW RIGHT   No orders of the defined types were placed in this encounter.     Procedures: Large Joint Inj: L knee on 07/05/2017 5:32 PM Indications: diagnostic evaluation, joint swelling and pain Details: 18 G 1.5 in needle, superolateral approach  Arthrogram: No  Medications: 5 mL lidocaine 1 %; 40 mg methylPREDNISolone acetate 40 MG/ML; 4 mL bupivacaine 0.25 % Outcome: tolerated well, no immediate complications Procedure, treatment alternatives, risks and benefits explained, specific risks discussed. Consent was given  by the patient. Immediately prior to procedure a time out was called to verify the correct patient, procedure, equipment, support staff and site/side marked as required. Patient was prepped and draped in the usual sterile fashion.       Clinical Data: No additional findings.  Objective: Vital Signs: There were no vitals taken for this visit.  Physical Exam:   Constitutional: Patient appears well-developed HEENT:  Head: Normocephalic Eyes:EOM are normal Neck: Normal range of motion Cardiovascular: Normal rate Pulmonary/chest: Effort normal Neurologic: Patient is alert Skin: Skin is warm Psychiatric: Patient has normal mood and affect    Ortho Exam: Orthopedic exam demonstrates normal gait and alignment.  Pedal pulses palpable.  Left foot MTP  joint has significantly diminished dorsiflexion and plantarflexion range of motion to about 20% of the contralateral right side.  There is no real synovitis or warmth in any of the MTP joints or ankle joints on either foot.  Patient has very well-preserved tibiotalar subtalar and transverse tarsal motion.  Both knees are examined.  Extensor mechanism is intact.  Collateral and cruciate ligaments are stable.  Range of motion is full.  No other masses lymphadenopathy or skin changes noted in the bilateral knee region.  No real warmth or synovitis noted in the knees.  Specialty Comments:  No specialty comments available.  Imaging: Xr Foot Complete Left  Result Date: 07/05/2017 AP lateral oblique left foot reviewed.  Severe end-stage first MTP arthritis is present.  No erosive changes seen in the remaining MTP joints or in the midfoot.  No other fracture dislocation or malalignment is present  Xr Knee 3 View Left  Result Date: 07/05/2017 AP lateral merchant left knee reviewed.  Slight varus alignment present in medial joint space narrowing also noted.  Lateral and patellofemoral compartments appear relatively spared.  Xr Knee 3 View Right  Result Date: 07/05/2017 AP lateral merchant right knee reviewed.  Bone-on-bone arthritis present in the medial compartment.  Lateral and patellofemoral compartments appear relatively spared.  Mild varus alignment is present.    PMFS History: Patient Active Problem List   Diagnosis Date Noted  . Back pain 07/24/2013  . Compression fracture of L4 lumbar vertebra ([facility]) 07/24/2013  . Breast cancer of upper-outer quadrant of left female breast ([facility]) 10/18/2012   Past Medical History:  Diagnosis Date  . Arthritis    lt great toe (RA)  . Breast cancer ([facility]) 11/15/12   left  . Cancer ([facility])    breast  . Compression fracture of L4 lumbar vertebra ([facility]) 11/04/2012   injury  . Contact lens/glasses fitting    wears contacts or glasses  . Gout 8-9 yrs ago   Rt  heel/foot  . History of radiation therapy 02/20/13-04/07/13   left breast 50.4 gray, upper outer quadrant boosted to 62.4 gray  . Osteoporosis   . Pseudogout of joint of left foot 6 yrs ago  . Raynaud's disease    bilateral hands and feet  . Wears partial dentures    partial lower    Family History  Problem Relation Age of Onset  . Hyperthyroidism Mother   . Cirrhosis Father   . Diabetes Brother   . Heart attack Maternal Grandmother   . Heart attack Maternal Grandfather     Past Surgical History:  Procedure Laterality Date  . BREAST LUMPECTOMY WITH NEEDLE LOCALIZATION AND AXILLARY SENTINEL LYMPH NODE BX Left 12/21/2012   Procedure: BREAST LUMPECTOMY WITH NEEDLE LOCALIZATION AND AXILLARY SENTINEL LYMPH NODE BX;  Surgeon: Haywood Lasso,  MD;  Location: [facility];  Service: General;  Laterality: Left;  needle local BCG    . BREAST SURGERY  age 85   left breast bx- benign  . CERVICAL CONIZATION W/BX  2012   benign  . KNEE ARTHROSCOPY  2006   right  . LATERAL EPICONDYLE RELEASE  03/08/2012   Procedure: TENNIS ELBOW RELEASE;  Surgeon: Cammie Sickle., MD;  Location: [facility];  Service: Orthopedics;  Laterality: Right;  RECONSTRUCTION OF RIGHT LATERAL ELBOW WITH TENDONS  . MASS EXCISION  03/08/2012   Procedure: EXCISION MASS;  Surgeon: Cammie Sickle., MD;  Location: [facility];  Service: Orthopedics;  Laterality: Right;  EXCISION OF RIGHT WRIST DORSAL CYST  . TENNIS ELBOW RELEASE/NIRSCHEL PROCEDURE Left 09/12/2015   Procedure: ARTHROTOMY LEFT ELBOW, REPAIR EXTENSOR ORIGIN;  Surgeon: Daryll Brod, MD;  Location: [facility];  Service: Orthopedics;  Laterality: Left;  . TONSILLECTOMY    . TONSILLECTOMY     57yrs old  . WRIST GANGLION EXCISION     left x2   Social History   Occupational History  . Not on file  Tobacco Use  . Smoking status: Current Every Day Smoker    Packs/day: 1.00    Years: 20.00    Pack  years: 20.00    Types: Cigarettes  . Smokeless tobacco: Never Used  . Tobacco comment: 1ppd or less  Substance and Sexual Activity  . Alcohol use: Yes    Comment: rare  . Drug use: No  . Sexual activity: Not on file    Comment: menarche age 23, miscarriage x 1, P58,  age 6 w/birth of son, Depo-Provera x 20 yrs

## 2017-07-06 ENCOUNTER — Other Ambulatory Visit (INDEPENDENT_AMBULATORY_CARE_PROVIDER_SITE_OTHER): Payer: Self-pay

## 2017-07-06 DIAGNOSIS — G8929 Other chronic pain: Secondary | ICD-10-CM

## 2017-07-06 DIAGNOSIS — M25561 Pain in right knee: Principal | ICD-10-CM

## 2017-07-06 DIAGNOSIS — M25562 Pain in left knee: Principal | ICD-10-CM

## 2017-09-01 NOTE — Progress Notes (Signed)
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 female with history of rheumatoid arthritis, pseudogout and Raynauds phenomenon.  Patient states that she started having symptoms of joint swelling about 20 years ago with pain and swelling in her foot.  At the time she was seen by Dr. Jerene Pitch at [facility] who diagnosed her with pseudogout and rheumatoid arthritis.  She was tried on different medications which she could not take any medicines due to the side effects.  According to the records she was on prednisone and Plaquenil but it was discontinued due to nausea and vomiting.  She states she is only taken pain medications for treatment of rheumatoid arthritis.  She is also had history of Raynauds for the last 30 years which is getting worse.  She was also diagnosed with osteoporosis and L4 vertebral fracture.  She was a started on Prolia but it dropped her blood pressure and she discontinued the medication.  She continues to have pain and swelling in her bilateral hands and bilateral feet.  She also has severe Raynauds.  Patient reports that she breaks out in hives intermittently when she is under stress.  Activities of Daily Living:  Patient reports morning stiffness for 1 hour.   Patient Denies nocturnal pain.  Difficulty dressing/grooming: Denies Difficulty climbing stairs: Denies Difficulty getting out of chair: Denies Difficulty using hands for taps, buttons, cutlery, and/or writing: Reports   Review of Systems  Constitutional: Negative for activity change, fatigue, night sweats, weight gain and weight loss.    HENT: Negative for mouth sores, trouble swallowing, trouble swallowing, mouth dryness and nose dryness.   Eyes: Negative for pain, redness, visual disturbance and dryness.  Respiratory: Negative for cough, shortness of breath and difficulty breathing.   Cardiovascular: Negative for chest pain, palpitations, hypertension, irregular heartbeat and swelling in legs/feet.  Gastrointestinal: Positive for constipation. Negative for blood in stool and diarrhea.  Endocrine: Positive for cold intolerance and excessive thirst. Negative for increased urination.  Genitourinary: Negative for difficulty urinating and vaginal dryness.  Musculoskeletal: Positive for arthralgias, joint pain, joint swelling and morning stiffness. Negative for myalgias, muscle weakness, muscle tenderness and myalgias.  Skin: Positive for color change. Negative for rash, hair loss, skin tightness, ulcers and sensitivity to sunlight.  Allergic/Immunologic: Negative for susceptible to infections.  Neurological: Positive for numbness and weakness. Negative for dizziness, memory loss and night sweats.  Hematological: Negative for bruising/bleeding tendency and swollen glands.  Psychiatric/Behavioral: Positive for sleep disturbance. Negative for depressed mood. The patient is not nervous/anxious.     PMFS History:  Patient Active Problem List   Diagnosis Date Noted  . History of osteoporosis 09/13/2017  . History of vertebral compression fracture 09/13/2017  . Raynaud's syndrome without gangrene 09/13/2017  . Pseudogout 09/13/2017  . History of migraine 09/13/2017  . Back pain 07/24/2013  . Compression fracture of L4 lumbar vertebra 07/24/2013  . Breast cancer of upper-outer quadrant of left female breast ([facility]) 10/18/2012    Past Medical  History:  Diagnosis Date  . Arthritis    lt great toe (RA)  . Breast cancer ([facility]) 11/15/12   left  . Cancer ([facility])    breast  . Compression fracture of L4 lumbar vertebra 11/04/2012   injury   . Contact lens/glasses fitting    wears contacts or glasses  . Gout 8-9 yrs ago   Rt heel/foot  . History of radiation therapy 02/20/13-04/07/13   left breast 50.4 gray, upper outer quadrant boosted to 62.4 gray  . Osteoporosis   . Pseudogout of joint of left foot 6 yrs ago  . Raynaud's disease    bilateral hands and feet  . Wears partial dentures    partial lower    Family History  Problem Relation Age of Onset  . Hyperthyroidism Mother   . Cirrhosis Father   . Diabetes Brother   . Heart attack Maternal Grandmother   . Heart attack Maternal Grandfather    Past Surgical History:  Procedure Laterality Date  . BREAST LUMPECTOMY WITH NEEDLE LOCALIZATION AND AXILLARY SENTINEL LYMPH NODE BX Left 12/21/2012   Procedure: BREAST LUMPECTOMY WITH NEEDLE LOCALIZATION AND AXILLARY SENTINEL LYMPH NODE BX;  Surgeon: Haywood Lasso, MD;  Location: [facility];  Service: General;  Laterality: Left;  needle local BCG    . BREAST SURGERY  age 71   left breast bx- benign  . CERVICAL CONIZATION W/BX  2012   benign  . ELBOW SURGERY    . KNEE ARTHROPLASTY    . KNEE ARTHROSCOPY  2006   right  . LATERAL EPICONDYLE RELEASE  03/08/2012   Procedure: TENNIS ELBOW RELEASE;  Surgeon: Cammie Sickle., MD;  Location: [facility];  Service: Orthopedics;  Laterality: Right;  RECONSTRUCTION OF RIGHT LATERAL ELBOW WITH TENDONS  . MASS EXCISION  03/08/2012   Procedure: EXCISION MASS;  Surgeon: Cammie Sickle., MD;  Location: [facility];  Service: Orthopedics;  Laterality: Right;  EXCISION OF RIGHT WRIST DORSAL CYST  . TENNIS ELBOW RELEASE/NIRSCHEL PROCEDURE Left 09/12/2015   Procedure: ARTHROTOMY LEFT ELBOW, REPAIR EXTENSOR ORIGIN;  Surgeon: Daryll Brod, MD;  Location: [facility];  Service: Orthopedics;  Laterality: Left;  . TONSILLECTOMY    . TONSILLECTOMY     57yrs old  . WRIST GANGLION EXCISION     left x2   Social History   Social  History Narrative   ** Merged History Encounter **         Objective: Vital Signs: BP 115/64 (BP Location: Right Arm, Patient Position: Sitting, Cuff Size: Normal)   Pulse 76   Resp 16   Ht 5\' 1"  (1.549 m)   Wt 121 lb (54.9 kg)   BMI 22.86 kg/m    Physical Exam  Constitutional: She is oriented to person, place, and time. She appears well-developed and well-nourished.  HENT:  Head: Normocephalic and atraumatic.  Eyes: Conjunctivae and EOM are normal.  Neck: Normal range of motion.  Cardiovascular: Normal rate, regular rhythm, normal heart sounds and intact distal pulses.  Pulmonary/Chest: Effort normal and breath sounds normal.  Abdominal: Soft. Bowel sounds are normal.  Lymphadenopathy:    She has no cervical adenopathy.  Neurological: She is alert and oriented to person, place, and time.  Skin: Skin is warm and dry. Capillary refill takes 2 to 3 seconds.  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  Some telangiectasias were noted on her neck.  Psychiatric: She has  a normal mood and affect. Her behavior is normal.  Nursing note and vitals reviewed.    Musculoskeletal Exam: C-spine thoracic lumbar spine good range of motion.  Shoulder joints elbow joints wrist joints are good range of motion.  She has limited range of motion of the DIP PIP with incomplete fist formation.  Hip joints, knee joints, ankles MTPs PIPs were in good range of motion.  CDAI Exam: CDAI Homunculus Exam:   Joint Counts:  CDAI Tender Joint count: 0 CDAI Swollen Joint count: 0  Global Assessments:  Patient Global Assessment: 8 Provider Global Assessment: 8  CDAI Calculated Score: 16    Investigation: No additional findings. CBC Latest Ref Rng & Units 01/05/2016 06/16/2015 08/08/2014  WBC 4.0 - 10.5 K/uL 4.9 5.4 4.7  Hemoglobin 12.0 - 15.0 g/dL 14.1 13.9 12.8  Hematocrit 36.0 - 46.0 % 44.6 41.1 39.3  Platelets 150 - 400 K/uL 231 171 173   CMP Latest Ref  Rng & Units 01/05/2016 06/16/2015 08/08/2014  Glucose 65 - 99 mg/dL 101(H) 112(H) 143(H)  BUN 6 - 20 mg/dL 9 11 9.1  Creatinine 0.44 - 1.00 mg/dL 0.82 0.91 0.9  Sodium 135 - 145 mmol/L 138 141 143  Potassium 3.5 - 5.1 mmol/L 4.5 4.3 4.6  Chloride 101 - 111 mmol/L 104 104 -  CO2 22 - 32 mmol/L 28 30 27   Calcium 8.9 - 10.3 mg/dL 9.2 9.3 9.1  Total Protein 6.5 - 8.1 g/dL 6.8 6.5 6.3(L)  Total Bilirubin 0.3 - 1.2 mg/dL 0.7 0.6 0.45  Alkaline Phos 38 - 126 U/L 83 78 66  AST 15 - 41 U/L 15 16 13   ALT 14 - 54 U/L 12(L) 13(L) 11    Imaging: No results found.  Speciality Comments: No specialty comments available.    Procedures:  No procedures performed Allergies: Hydroxychloroquine; Prednisone; Doxycycline; Ibuprofen; Keflex [cephalexin]; Ketorolac; Lodine [etodolac]; Methocarbamol; Oxycodone; Oxycodone; and Robaxin [methocarbamol]   Assessment / Plan:     Visit Diagnoses: Rheumatoid arthritis with rheumatoid factor of multiple sites without organ or systems involvement ([facility]) - treated by Dr. Jerene Pitch in [facility].  I do not see any synovitis on examination today.  I also reviewed her x-rays done by Dr. Jerene Pitch in the past which do not show any osteopenia per report and erosive changes.  Patient reports that she has tried prednisone and Plaquenil in the past but could not tolerate it.  She was in agreement with Dr. Jerene Pitch to not to try any more disease modifying agents.  She has known Dr. Jerene Pitch for multiple years and would resume care with him.   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  She has some nailbed capillary changes.  She also has telangiectasias.  Blood work today and I have advised patient to discuss this further with Dr. Jerene Pitch.  High risk medication use - d/c PLQ 200 mg BID due to GI SE - Plan: CBC with Differential/Platelet, COMPLETE METABOLIC PANEL WITH GFR, Urinalysis, Routine w reflex  microscopic  Pain in both hands - Plan: Cyclic citrul peptide antibody, IgG, 14-3-3 eta Protein  Pseudogout-patient gives history of pseudogout for multiple years.  She is currently not taking any treatment.  Her x-ray findings show some osteoarthritic changes and calcification in her left first MTP.  Raynaud's syndrome without gangrene -she is a smoker as well.  Plan: ANA, Anti-scleroderma antibody, RNP Antibody, Anti-Smith antibody, Sjogrens syndrome-A extractable nuclear antibody,  Sjogrens syndrome-B extractable nuclear antibody, Anti-DNA antibody, double-stranded, C3 and C4, Beta-2 glycoprotein antibodies, Cardiolipin antibodies, IgG, IgM, IgA, Lupus Anticoagulant Eval w/Reflex  Smoker-smoking cessation was discussed.  Other fatigue  Chronic pain of both knees-do not have any x-rays to review.  Currently she is not having much discomfort in her knees.  Malignant neoplasm of upper-outer quadrant of left female breast, unspecified estrogen receptor status ([facility])  History of osteoporosis-states that she has tried medications and did not like any of the side effects.  She only likes to take medications when she hurts.  History of vertebral compression fracture - L4  History of migraine   Chronic pain-she is on tramadol and hydrocodone on as needed basis.   Orders: Orders Placed This Encounter  Procedures  . CBC with Differential/Platelet  . COMPLETE METABOLIC PANEL WITH GFR  . Urinalysis, Routine w reflex microscopic  . Cyclic citrul peptide antibody, IgG  . 14-3-3 eta Protein  . ANA  . Anti-scleroderma antibody  . RNP Antibody  . Anti-Smith antibody  . Sjogrens syndrome-A extractable nuclear antibody  . Sjogrens syndrome-B extractable nuclear antibody  . Anti-DNA antibody, double-stranded  . C3 and C4  . Beta-2 glycoprotein antibodies  . Cardiolipin antibodies, IgG, IgM, IgA  . Lupus Anticoagulant Eval w/Reflex   No orders of the defined types were placed in this  encounter.   Face-to-face time spent with patient was 50 minutes.> 50% of time was spent in counseling and coordination of care.  I will contact her once the lab results are available.  I will forward her lab work to Dr. Jerene Pitch.  Follow-Up Instructions: Return if symptoms worsen or fail to improve, for Inflammatory arthritis and rainouts.   Bo Merino, MD  Note - This record has been created using Editor, commissioning.  Chart creation errors have been sought, but may not always  have been located. Such creation errors do not reflect on  the standard of medical care.

## 2017-09-13 ENCOUNTER — Encounter: Payer: Self-pay | Admitting: Rheumatology

## 2017-09-13 ENCOUNTER — Encounter: Payer: Self-pay | Admitting: *Deleted

## 2017-09-13 ENCOUNTER — Ambulatory Visit (INDEPENDENT_AMBULATORY_CARE_PROVIDER_SITE_OTHER): Payer: Commercial Managed Care - PPO | Admitting: Rheumatology

## 2017-09-13 VITALS — BP 115/64 | HR 76 | Resp 16 | Ht 61.0 in | Wt 121.0 lb

## 2017-09-13 DIAGNOSIS — M25561 Pain in right knee: Secondary | ICD-10-CM

## 2017-09-13 DIAGNOSIS — F17200 Nicotine dependence, unspecified, uncomplicated: Secondary | ICD-10-CM

## 2017-09-13 DIAGNOSIS — I7300 Raynaud's syndrome without gangrene: Secondary | ICD-10-CM | POA: Diagnosis not present

## 2017-09-13 DIAGNOSIS — R5383 Other fatigue: Secondary | ICD-10-CM

## 2017-09-13 DIAGNOSIS — C50412 Malignant neoplasm of upper-outer quadrant of left female breast: Secondary | ICD-10-CM | POA: Diagnosis not present

## 2017-09-13 DIAGNOSIS — Z8781 Personal history of (healed) traumatic fracture: Secondary | ICD-10-CM | POA: Diagnosis not present

## 2017-09-13 DIAGNOSIS — Z8669 Personal history of other diseases of the nervous system and sense organs: Secondary | ICD-10-CM

## 2017-09-13 DIAGNOSIS — M25562 Pain in left knee: Secondary | ICD-10-CM

## 2017-09-13 DIAGNOSIS — M1120 Other chondrocalcinosis, unspecified site: Secondary | ICD-10-CM | POA: Insufficient documentation

## 2017-09-13 DIAGNOSIS — G8929 Other chronic pain: Secondary | ICD-10-CM

## 2017-09-13 DIAGNOSIS — Z79899 Other long term (current) drug therapy: Secondary | ICD-10-CM

## 2017-09-13 DIAGNOSIS — M79642 Pain in left hand: Secondary | ICD-10-CM

## 2017-09-13 DIAGNOSIS — Z8739 Personal history of other diseases of the musculoskeletal system and connective tissue: Secondary | ICD-10-CM

## 2017-09-13 DIAGNOSIS — M0579 Rheumatoid arthritis with rheumatoid factor of multiple sites without organ or systems involvement: Secondary | ICD-10-CM | POA: Diagnosis not present

## 2017-09-13 DIAGNOSIS — M79641 Pain in right hand: Secondary | ICD-10-CM

## 2017-09-16 LAB — COMPLETE METABOLIC PANEL WITH GFR
AG RATIO: 2.4 (calc) (ref 1.0–2.5)
ALT: 9 U/L (ref 6–29)
AST: 13 U/L (ref 10–35)
Albumin: 4.7 g/dL (ref 3.6–5.1)
Alkaline phosphatase (APISO): 75 U/L (ref 33–130)
BUN: 11 mg/dL (ref 7–25)
CALCIUM: 9.3 mg/dL (ref 8.6–10.4)
CO2: 31 mmol/L (ref 20–32)
CREATININE: 0.77 mg/dL (ref 0.50–1.05)
Chloride: 100 mmol/L (ref 98–110)
GFR, EST NON AFRICAN AMERICAN: 85 mL/min/{1.73_m2} (ref >=60)
GFR, Est African American: 99 mL/min/{1.73_m2} (ref >=60)
GLOBULIN: 2 g/dL (ref 1.9–3.7)
Glucose, Bld: 91 mg/dL (ref 65–99)
Potassium: 4.1 mmol/L (ref 3.5–5.3)
SODIUM: 140 mmol/L (ref 135–146)
Total Bilirubin: 0.6 mg/dL (ref 0.2–1.2)
Total Protein: 6.7 g/dL (ref 6.1–8.1)

## 2017-09-16 LAB — CBC WITH DIFFERENTIAL/PLATELET
BASOS PCT: 0.6 %
Basophils Absolute: 31 cells/uL (ref 0–200)
EOS PCT: 2.9 %
Eosinophils Absolute: 148 cells/uL (ref 15–500)
HCT: 41.6 % (ref 35.0–45.0)
HEMOGLOBIN: 14.3 g/dL (ref 11.7–15.5)
Lymphs Abs: 1270 cells/uL (ref 850–3900)
MCH: 30.4 pg (ref 27.0–33.0)
MCHC: 34.4 g/dL (ref 32.0–36.0)
MCV: 88.5 fL (ref 80.0–100.0)
MPV: 9.5 fL (ref 7.5–12.5)
Monocytes Relative: 6.3 %
NEUTROS ABS: 3330 {cells}/uL (ref 1500–7800)
Neutrophils Relative %: 65.3 %
Platelets: 200 10*3/uL (ref 140–400)
RBC: 4.7 10*6/uL (ref 3.80–5.10)
RDW: 12.1 % (ref 11.0–15.0)
Total Lymphocyte: 24.9 %
WBC mixed population: 321 cells/uL (ref 200–950)
WBC: 5.1 10*3/uL (ref 3.8–10.8)

## 2017-09-16 LAB — ANTI-NUCLEAR AB-TITER (ANA TITER)

## 2017-09-16 LAB — LUPUS ANTICOAGULANT EVAL W/ REFLEX
PTT-LA Screen: 34 s (ref <=40)
dRVVT: 36 s (ref <=45)

## 2017-09-16 LAB — BETA-2 GLYCOPROTEIN ANTIBODIES
BETA 2 GLYCO 1 IGM: 10 SMU (ref <=20)
Beta-2 Glyco 1 IgA: <9 SAU (ref <=20)
Beta-2 Glyco I IgG: <9 SGU (ref <=20)

## 2017-09-16 LAB — URINALYSIS, ROUTINE W REFLEX MICROSCOPIC
Bilirubin Urine: NEGATIVE
GLUCOSE, UA: NEGATIVE
HGB URINE DIPSTICK: NEGATIVE
KETONES UR: NEGATIVE
LEUKOCYTES UA: NEGATIVE
NITRITE: NEGATIVE
PROTEIN: NEGATIVE
Specific Gravity, Urine: 1.005 (ref 1.001–1.03)
pH: 6.5 (ref 5.0–8.0)

## 2017-09-16 LAB — ANA: ANA: POSITIVE — AB

## 2017-09-16 LAB — C3 AND C4
C3 Complement: 84 mg/dL (ref 83–193)
C4 COMPLEMENT: 11 mg/dL — AB (ref 15–57)

## 2017-09-16 LAB — 14-3-3 ETA PROTEIN: 14-3-3 ETA PROTEIN: 0.3 ng/mL — AB (ref <0.2)

## 2017-09-16 LAB — CARDIOLIPIN ANTIBODIES, IGG, IGM, IGA
Anticardiolipin IgA: <11 [APL'U]
Anticardiolipin IgM: 29 [MPL'U] — ABNORMAL HIGH

## 2017-09-16 LAB — ANTI-SCLERODERMA ANTIBODY: SCLERODERMA (SCL-70) (ENA) ANTIBODY, IGG: NEGATIVE AI

## 2017-09-16 LAB — ANTI-DNA ANTIBODY, DOUBLE-STRANDED: DS DNA AB: 1 [IU]/mL

## 2017-09-16 LAB — RNP ANTIBODY: RIBONUCLEIC PROTEIN(ENA) ANTIBODY, IGG: NEGATIVE AI

## 2017-09-16 LAB — SJOGRENS SYNDROME-A EXTRACTABLE NUCLEAR ANTIBODY: SSA (Ro) (ENA) Antibody, IgG: <1 AI

## 2017-09-16 LAB — CYCLIC CITRUL PEPTIDE ANTIBODY, IGG: Cyclic Citrullin Peptide Ab: <16 UNITS

## 2017-09-16 LAB — ANTI-SMITH ANTIBODY: ENA SM Ab Ser-aCnc: <1 AI

## 2017-09-16 LAB — SJOGRENS SYNDROME-B EXTRACTABLE NUCLEAR ANTIBODY: SSB (LA) (ENA) ANTIBODY, IGG: NEGATIVE AI

## 2017-09-20 ENCOUNTER — Telehealth: Payer: Self-pay | Admitting: Rheumatology

## 2017-09-20 NOTE — Telephone Encounter (Signed)
Patient would like to know if she can come in to see you this week to get her lab results and then return to Dr. Jerene Pitch after that. Please advise.

## 2017-09-20 NOTE — Telephone Encounter (Signed)
Patient called requesting the results from her labwork from 09/13/17.  Patient has a NPT FU appointment scheduled for 10/21/17, but doesn't want to wait until appointment to receive results.

## 2017-09-21 NOTE — Telephone Encounter (Signed)
Ok to schedule a sooner NP FU visit to discuss labs.

## 2017-09-24 ENCOUNTER — Ambulatory Visit (INDEPENDENT_AMBULATORY_CARE_PROVIDER_SITE_OTHER): Payer: Commercial Managed Care - PPO | Admitting: Rheumatology

## 2017-09-24 ENCOUNTER — Encounter: Payer: Self-pay | Admitting: Rheumatology

## 2017-09-24 VITALS — BP 104/64 | HR 79 | Resp 14 | Ht 65.0 in | Wt 121.0 lb

## 2017-09-24 DIAGNOSIS — I7300 Raynaud's syndrome without gangrene: Secondary | ICD-10-CM | POA: Diagnosis not present

## 2017-09-24 DIAGNOSIS — M1120 Other chondrocalcinosis, unspecified site: Secondary | ICD-10-CM | POA: Diagnosis not present

## 2017-09-24 DIAGNOSIS — M349 Systemic sclerosis, unspecified: Secondary | ICD-10-CM | POA: Diagnosis not present

## 2017-09-24 DIAGNOSIS — M19042 Primary osteoarthritis, left hand: Secondary | ICD-10-CM

## 2017-09-24 DIAGNOSIS — M19041 Primary osteoarthritis, right hand: Secondary | ICD-10-CM

## 2017-09-24 DIAGNOSIS — Z8739 Personal history of other diseases of the musculoskeletal system and connective tissue: Secondary | ICD-10-CM | POA: Diagnosis not present

## 2017-09-24 DIAGNOSIS — Z8781 Personal history of (healed) traumatic fracture: Secondary | ICD-10-CM | POA: Diagnosis not present

## 2017-09-24 DIAGNOSIS — C50412 Malignant neoplasm of upper-outer quadrant of left female breast: Secondary | ICD-10-CM

## 2017-09-24 DIAGNOSIS — Z8669 Personal history of other diseases of the nervous system and sense organs: Secondary | ICD-10-CM

## 2017-09-24 DIAGNOSIS — F17200 Nicotine dependence, unspecified, uncomplicated: Secondary | ICD-10-CM | POA: Diagnosis not present

## 2017-09-24 DIAGNOSIS — M359 Systemic involvement of connective tissue, unspecified: Secondary | ICD-10-CM | POA: Insufficient documentation

## 2017-09-24 NOTE — Progress Notes (Signed)
Office Visit Note  Patient: Doris Bender             Date of Birth: Mar 20, 1961           MRN: [id number]             PCP: Rory Percy, MD Referring: Rory Percy, MD Visit Date: 09/24/2017 Occupation: @GUAROCC @    Subjective:  Follow-up (Doing fair)   History of Present Illness: Doris Bender Doris Bender is a 57 y.o. female with history of autoimmune disease.  She states she continues to have some stiffness in her joints.  She states she had recent discomfort in her feet.  Her Raynauds is not as severe.  She feels tightness in her skin.  She states she would prefer to stay here with our practice and does not want to go to [facility] as a severity long drive for her.  Activities of Daily Living:  Patient reports morning stiffness for 1 hour.   Patient Denies nocturnal pain.  Difficulty dressing/grooming: Denies Difficulty climbing stairs: Reports Difficulty getting out of chair: Reports Difficulty using hands for taps, buttons, cutlery, and/or writing: Reports   Review of Systems  Constitutional: Positive for activity change and fatigue.  HENT: Positive for trouble swallowing, trouble swallowing and mouth dryness.   Eyes: Negative for dryness.  Respiratory: Negative for shortness of breath.   Cardiovascular: Positive for swelling in legs/feet.  Gastrointestinal: Negative for constipation.  Endocrine: Positive for cold intolerance.  Genitourinary: Negative for difficulty urinating.  Musculoskeletal: Positive for arthralgias, joint pain, joint swelling, muscle weakness, morning stiffness and muscle tenderness.  Skin: Positive for rash.  Neurological: Positive for numbness and weakness.  Hematological: Negative for bruising/bleeding tendency.  Psychiatric/Behavioral: Positive for sleep disturbance.    PMFS History:  Patient Active Problem List   Diagnosis Date Noted  . Smoker 09/24/2017  . Primary osteoarthritis of both hands 09/24/2017  . Autoimmune disease ([place])  09/24/2017  . History of osteoporosis 09/13/2017  . History of vertebral compression fracture 09/13/2017  . Raynaud's syndrome without gangrene 09/13/2017  . Pseudogout 09/13/2017  . History of migraine 09/13/2017  . Back pain 07/24/2013  . Compression fracture of L4 vertebra ([facility]) 07/24/2013  . Breast cancer of upper-outer quadrant of left female breast ([place]) 10/18/2012    Past Medical History:  Diagnosis Date  . Arthritis    lt great toe (RA)  . Breast cancer ([place]) 11/15/12   left  . Cancer ([place])    breast  . Compression fracture of L4 lumbar vertebra 11/04/2012   injury  . Contact lens/glasses fitting    wears contacts or glasses  . Gout 8-9 yrs ago   Rt heel/foot  . History of radiation therapy 02/20/13-04/07/13   left breast 50.4 gray, upper outer quadrant boosted to 62.4 gray  . Osteoporosis   . Pseudogout of joint of left foot 6 yrs ago  . Raynaud's disease    bilateral hands and feet  . Wears partial dentures    partial lower    Family History  Problem Relation Age of Onset  . Hyperthyroidism Mother   . Cirrhosis Father   . Diabetes Brother   . Heart attack Maternal Grandmother   . Heart attack Maternal Grandfather    Past Surgical History:  Procedure Laterality Date  . BREAST LUMPECTOMY WITH NEEDLE LOCALIZATION AND AXILLARY SENTINEL LYMPH NODE BX Left 12/21/2012   Procedure: BREAST LUMPECTOMY WITH NEEDLE LOCALIZATION AND AXILLARY SENTINEL LYMPH NODE BX;  Surgeon: Haywood Lasso, MD;  Location: [place];  Service: General;  Laterality: Left;  needle local BCG    . BREAST SURGERY  age 60   left breast bx- benign  . CERVICAL CONIZATION W/BX  2012   benign  . ELBOW SURGERY    . KNEE ARTHROPLASTY    . KNEE ARTHROSCOPY  2006   right  . LATERAL EPICONDYLE RELEASE  03/08/2012   Procedure: TENNIS ELBOW RELEASE;  Surgeon: Cammie Sickle., MD;  Location: [facility];  Service: Orthopedics;  Laterality: Right;  RECONSTRUCTION OF  RIGHT LATERAL ELBOW WITH TENDONS  . MASS EXCISION  03/08/2012   Procedure: EXCISION MASS;  Surgeon: Cammie Sickle., MD;  Location: [place];  Service: Orthopedics;  Laterality: Right;  EXCISION OF RIGHT WRIST DORSAL CYST  . TENNIS ELBOW RELEASE/NIRSCHEL PROCEDURE Left 09/12/2015   Procedure: ARTHROTOMY LEFT ELBOW, REPAIR EXTENSOR ORIGIN;  Surgeon: Daryll Brod, MD;  Location: [place];  Service: Orthopedics;  Laterality: Left;  . TONSILLECTOMY    . TONSILLECTOMY     57yrs old  . WRIST GANGLION EXCISION     left x2   Social History   Social History Narrative   ** Merged History Encounter **         Objective: Vital Signs: BP 104/64 (BP Location: Right Arm, Patient Position: Sitting, Cuff Size: Normal)   Pulse 79   Resp 14   Ht 5\' 5"  (1.651 m)   Wt 121 lb (54.9 kg)   BMI 20.14 kg/m    Physical Exam  Constitutional: She is oriented to person, place, and time. She appears well-developed and well-nourished.  HENT:  Head: Normocephalic and atraumatic.  Eyes: Conjunctivae and EOM are normal.  Neck: Normal range of motion.  Cardiovascular: Normal rate, regular rhythm, normal heart sounds and intact distal pulses.  Pulmonary/Chest: Effort normal and breath sounds normal.  Abdominal: Soft. Bowel sounds are normal.  Lymphadenopathy:    She has no cervical adenopathy.  Neurological: She is alert and oriented to person, place, and time.  Skin: Skin is warm and dry. Capillary refill takes 2 to 3 seconds.  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  She had periungual erythema and capillary dropout.  Psychiatric: She has a normal mood and affect. Her behavior is normal.  Nursing note and vitals reviewed.    Musculoskeletal Exam: C-spine, thoracic ,lumbar spine good range of motion.  Shoulder joints elbow joints wrist joints are in good range of motion.  She has DIP and PIP thickening.  Hip joints knee joints ankles MTPs PIPs  were in good range of motion.  She is some crepitus in her knee joints.  CDAI Exam: No CDAI exam completed.    Investigation: No additional findings.   Component     Latest Ref Rng & Units 09/13/2017  Lupus Anticoagulant      see note  PTT-LA Screen     <=40 sec 34  DRVVT     <=45 sec 36  PT, Mixing Interp      Not Indicated  Beta-2 Glycoprotein I Ab, IgG     < OR = 20 SGU <9  Beta-2 Glyco 1 IgM     < OR = 20 SMU 10  Beta-2 Glyco 1 IgA     < OR = 20 SAU <9  Anticardiolipin Ab,IgA,Qn     APL <11  Anticardiolipin Ab,IgG,Qn     GPL <14  Anticardiolipin Ab,IgM,Qn  MPL 29 (H)  C3 Complement     83 - 193 mg/dL 84  C4 Complement     15 - 57 mg/dL 11 (L)  ANA Pattern 1      CENTROMERE (A)  ANA Titer 1     titer > OR = 5:0354 (A)  Cyclic Citrullin Peptide Ab     UNITS <16  14-3-3 eta Protein     <0.2 ng/mL 0.3 (H)  Anit Nuclear Antibody(ANA)     NEGATIVE POSITIVE (A)  Scleroderma (Scl-70) (ENA) Antibody, IgG     <1.0 NEG AI <1.0 NEG  Ribonucleic Protein(ENA) Antibody, IgG     <1.0 NEG AI <1.0 NEG  ENA SM Ab Ser-aCnc     <1.0 NEG AI <1.0 NEG  SSA (Ro) (ENA) Antibody, IgG     <1.0 NEG AI <1.0 NEG  SSB (La) (ENA) Antibody, IgG     <1.0 NEG AI <1.0 NEG  ds DNA Ab     IU/mL 1   CBC Latest Ref Rng & Units 09/13/2017 01/05/2016 06/16/2015  WBC 3.8 - 10.8 Thousand/uL 5.1 4.9 5.4  Hemoglobin 11.7 - 15.5 g/dL 14.3 14.1 13.9  Hematocrit 35.0 - 45.0 % 41.6 44.6 41.1  Platelets 140 - 400 Thousand/uL 200 231 171   CMP Latest Ref Rng & Units 09/13/2017 01/05/2016 06/16/2015  Glucose 65 - 99 mg/dL 91 101(H) 112(H)  BUN 7 - 25 mg/dL 11 9 11   Creatinine 0.50 - 1.05 mg/dL 0.77 0.82 0.91  Sodium 135 - 146 mmol/L 140 138 141  Potassium 3.5 - 5.3 mmol/L 4.1 4.5 4.3  Chloride 98 - 110 mmol/L 100 104 104  CO2 20 - 32 mmol/L 31 28 30   Calcium 8.6 - 10.4 mg/dL 9.3 9.2 9.3  Total Protein 6.1 - 8.1 g/dL 6.7 6.8 6.5  Total Bilirubin 0.2 - 1.2 mg/dL 0.6 0.7 0.6  Alkaline Phos 38 - 126  U/L - 83 78  AST 10 - 35 U/L 13 15 16   ALT 6 - 29 U/L 9 12(L) 13(L)     Imaging: No results found.  Speciality Comments: No specialty comments available.    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  Alert edema.  Her most recent labs showed positive ANA 1: 1280 centromere, anticardiolipin IgM 29,  (SCL 70, RNP, Smith, SSA, SSB, dsDNA negative, lupus anticoagulant negative, beta-2 neg).  I will refer her to pulmonologist and cardiologist to get baseline PFTs and echocardiogram.  Raynaud's disease without gangrene - Nailbed capillary changes, Telengectesia's  Primary osteoarthritis of both hands-joint protection muscle strengthening discussed.  Pseudogout-patient gives history of pseudogout diagnosed by Dr. Jerene Pitch in the past.  Patient states her crystals were positive.  She has had no recent flare of pseudogout.  Smoker-smoking cessation was discussed at length.  She has a history of Raynauds.  Malignant neoplasm of upper-outer quadrant of left female breast, unspecified estrogen receptor status ([place])  History of osteoporosis-I do not have a DEXA available to review.  She is not taking any medications for osteoporosis.  History of migraine  History of vertebral compression fracture    Orders: Orders Placed This Encounter  Procedures  . Ambulatory referral to Pulmonology  . Ambulatory referral to Cardiology   No orders of the defined types were placed  in this encounter.   Face-to-face time spent with patient was 30 minutes. >50% of time was spent in counseling and coordination of care.  Follow-Up Instructions: Return in about 6  months (around 03/27/2018) for Autoimmune disease, Osteoarthritis.   Bo Merino, MD  Note - This record has been created using Editor, commissioning.  Chart creation errors have been sought, but may not always  have been located. Such creation errors do not reflect on  the standard of medical care.

## 2017-09-24 NOTE — Telephone Encounter (Signed)
Patient scheduled for 09/24/17.

## 2017-10-20 ENCOUNTER — Telehealth (HOSPITAL_COMMUNITY): Payer: Self-pay | Admitting: *Deleted

## 2017-10-20 DIAGNOSIS — M349 Systemic sclerosis, unspecified: Secondary | ICD-10-CM

## 2017-10-20 NOTE — Telephone Encounter (Signed)
Received referral from Dr Estanislado Pandy, pt has scleroderma and needs appt w/Dr Bensimhon along w/echo and pfts.  Orders placed will schedule

## 2017-10-21 ENCOUNTER — Ambulatory Visit: Payer: Commercial Managed Care - PPO | Admitting: Rheumatology

## 2017-11-15 ENCOUNTER — Institutional Professional Consult (permissible substitution): Payer: Self-pay | Admitting: Pulmonary Disease

## 2017-11-16 ENCOUNTER — Encounter: Payer: Self-pay | Admitting: Pulmonary Disease

## 2017-11-16 ENCOUNTER — Ambulatory Visit (INDEPENDENT_AMBULATORY_CARE_PROVIDER_SITE_OTHER)
Admission: RE | Admit: 2017-11-16 | Discharge: 2017-11-16 | Disposition: A | Discharge status: Home / Self Care | Payer: Commercial Managed Care - PPO | Source: Ambulatory Visit | Attending: Pulmonary Disease | Admitting: Pulmonary Disease

## 2017-11-16 ENCOUNTER — Ambulatory Visit (INDEPENDENT_AMBULATORY_CARE_PROVIDER_SITE_OTHER): Payer: Commercial Managed Care - PPO | Admitting: Pulmonary Disease

## 2017-11-16 VITALS — BP 112/68 | HR 89 | Ht 65.0 in | Wt 124.0 lb

## 2017-11-16 DIAGNOSIS — F17200 Nicotine dependence, unspecified, uncomplicated: Secondary | ICD-10-CM | POA: Diagnosis not present

## 2017-11-16 DIAGNOSIS — M349 Systemic sclerosis, unspecified: Secondary | ICD-10-CM

## 2017-11-16 DIAGNOSIS — R059 Cough, unspecified: Secondary | ICD-10-CM

## 2017-11-16 DIAGNOSIS — R05 Cough: Secondary | ICD-10-CM

## 2017-11-16 LAB — PULMONARY FUNCTION TEST
DL/VA % pred: 79 %
DL/VA: 3.92 ml/min/mmHg/L
DLCO UNC % PRED: 57 %
DLCO unc: 14.62 ml/min/mmHg
FEF 25-75 PRE: 0.74 L/s
FEF 25-75 Post: 0.83 L/sec
FEF2575-%Change-Post: 13 %
FEF2575-%PRED-POST: 33 %
FEF2575-%Pred-Pre: 29 %
FEV1-%CHANGE-POST: 6 %
FEV1-%Pred-Post: 58 %
FEV1-%Pred-Pre: 55 %
FEV1-POST: 1.57 L
FEV1-PRE: 1.48 L
FEV1FVC-%Change-Post: 5 %
FEV1FVC-%Pred-Pre: 76 %
FEV6-%CHANGE-POST: 0 %
FEV6-%PRED-PRE: 73 %
FEV6-%Pred-Post: 74 %
FEV6-PRE: 2.46 L
FEV6-Post: 2.47 L
FEV6FVC-%Change-Post: 0 %
FEV6FVC-%PRED-PRE: 102 %
FEV6FVC-%Pred-Post: 101 %
FVC-%CHANGE-POST: 1 %
FVC-%PRED-POST: 72 %
FVC-%PRED-PRE: 71 %
FVC-POST: 2.51 L
FVC-PRE: 2.48 L
POST FEV1/FVC RATIO: 63 %
Post FEV6/FVC ratio: 99 %
Pre FEV1/FVC ratio: 59 %
Pre FEV6/FVC Ratio: 99 %
RV % PRED: 137 %
RV: 2.76 L
TLC % pred: 101 %
TLC: 5.28 L

## 2017-11-16 NOTE — Progress Notes (Signed)
Synopsis: Referred in July 2019 for wheezing, has scleroderma.  Had previously been diagnosed with rheumatoid arthritis but did not have erosive joint abnormalities.  Subjective:   PATIENT ID: Doris Bender GENDER: female DOB: 07-Feb-1961, MRN: [id number]   HPI  Chief Complaint  Patient presents with  . New Consult    Scleroderma, wheezing at night    This is a pleasant 57 year old female who was recently diagnosed with rheumatoid arthritis and scleroderma who comes to my clinic today for evaluation of possible underlying lung involvement.  She says that she had a normal childhood without respiratory illnesses and she denies a family history in first-degree relatives of lung problems.  She has smoked 1/2 to 1 pack daily for most of her adult life, started smoking in her teenage years.  She quit briefly for 7 years when she had a child.  Her total amount of smoking is greater than 35 pack years.  She tells me that she does not have any respiratory problems.  She stays quite active and works more than 9 hours a day without problems with shortness of breath.  She recently tore down a deck in her backyard and did not have any problems with shortness of breath with that.  She denies problems with persistent daily cough or mucus production.  She does have a little bit of a cough right now but she says she is getting over a case of bronchitis.  She denies having recurrent episodes of bronchitis.  However, approximately a year ago she did have a dry cough after some mold developed in her office at work.   Breast cancer: treated with lumpectomy, had 6.5 weeks radiation, tried tamoxifen but couldn't handle it after 3-4 months of treatment, side effects were too much   Past Medical History:  Diagnosis Date  . Arthritis    lt great toe (RA)  . Breast cancer ([place]) 11/15/12   left  . Cancer ([place])    breast  . Compression fracture of L4 lumbar vertebra 11/04/2012   injury  . Contact lens/glasses  fitting    wears contacts or glasses  . Gout 8-9 yrs ago   Rt heel/foot  . History of radiation therapy 02/20/13-04/07/13   left breast 50.4 gray, upper outer quadrant boosted to 62.4 gray  . Osteoporosis   . Pseudogout of joint of left foot 6 yrs ago  . Raynaud's disease    bilateral hands and feet  . Wears partial dentures    partial lower     Family History  Problem Relation Age of Onset  . Hyperthyroidism Mother   . Cirrhosis Father   . Diabetes Brother   . Heart attack Maternal Grandmother   . Heart attack Maternal Grandfather      Social History   Socioeconomic History  . Marital status: Widowed    Spouse name: Not on file  . Number of children: Not on file  . Years of education: Not on file  . Highest education level: Not on file  Occupational History  . Not on file  Social Needs  . Financial resource strain: Not on file  . Food insecurity:    Worry: Not on file    Inability: Not on file  . Transportation needs:    Medical: Not on file    Non-medical: Not on file  Tobacco Use  . Smoking status: Current Every Day Smoker    Packs/day: 0.50    Years: 20.00    Pack years: 10.00  Types: Cigarettes  . Smokeless tobacco: Never Used  . Tobacco comment: 1ppd or less  Substance and Sexual Activity  . Alcohol use: Yes    Comment: rare 1 yearly  . Drug use: No  . Sexual activity: Not on file    Comment: menarche age 22, miscarriage x 1, P17,  age 61 w/birth of son, Depo-Provera x 20 yrs  Lifestyle  . Physical activity:    Days per week: Not on file    Minutes per session: Not on file  . Stress: Not on file  Relationships  . Social connections:    Talks on phone: Not on file    Gets together: Not on file    Attends religious service: Not on file    Active member of club or organization: Not on file    Attends meetings of clubs or organizations: Not on file    Relationship status: Not on file  . Intimate partner violence:    Fear of current or ex partner:  Not on file    Emotionally abused: Not on file    Physically abused: Not on file    Forced sexual activity: Not on file  Other Topics Concern  . Not on file  Social History Narrative   ** Merged History Encounter **         Allergies  Allergen Reactions  . Hydroxychloroquine Nausea And Vomiting and Other (See Comments)    Back, stomach/ abd pain.  . Prednisone Nausea And Vomiting    Other reaction(s): Abdominal Pain  . Doxycycline     dizzy  . Ibuprofen Hives  . Keflex [Cephalexin] Hives  . Ketorolac Hives  . Lodine [Etodolac] Hives  . Methocarbamol Nausea Only  . Oxycodone Nausea And Vomiting  . Oxycodone Nausea Only  . Robaxin [Methocarbamol] Hives     Outpatient Medications Prior to Visit  Medication Sig Dispense Refill  . ALPRAZolam (XANAX) 0.25 MG tablet Take 0.25 mg by mouth at bedtime.   0  . HYDROcodone-acetaminophen (NORCO) 5-325 MG tablet Take 1 tablet by mouth every 6 (six) hours as needed for moderate pain. 12 tablet 0  . naproxen sodium (ALEVE) 220 MG tablet Take 220 mg by mouth daily as needed (headache).     No facility-administered medications prior to visit.     Review of Systems  Constitutional: Negative for chills, fever, malaise/fatigue and weight loss.  HENT: Negative for congestion, nosebleeds, sinus pain and sore throat.   Eyes: Negative for photophobia, pain and discharge.  Respiratory: Negative for cough, hemoptysis, sputum production, shortness of breath and wheezing.   Cardiovascular: Negative for chest pain, palpitations, orthopnea and leg swelling.  Gastrointestinal: Negative for abdominal pain, constipation, diarrhea, nausea and vomiting.  Genitourinary: Negative for dysuria, frequency, hematuria and urgency.  Musculoskeletal: Negative for back pain, joint pain, myalgias and neck pain.  Skin: Negative for itching and rash.  Neurological: Negative for tingling, tremors, sensory change, speech change, focal weakness, seizures, weakness and  headaches.  Psychiatric/Behavioral: Negative for memory loss, substance abuse and suicidal ideas. The patient is not nervous/anxious.       Objective:  Physical Exam   Vitals:   11/16/17 1425  BP: 112/68  Pulse: 89  SpO2: 92%  Weight: 124 lb (56.2 kg)  Height: 5\' 5"  (1.651 m)    RA  Gen: well appearing, no acute distress HENT: NCAT, OP clear, neck supple without masses Eyes: PERRL, EOMi Lymph: no cervical lymphadenopathy PULM: CTA B CV: RRR, no mgr, no JVD  GI: BS+, soft, nontender, no hsm Derm: no rash or skin breakdown MSK: sclerodactily, normal bulk and tone Neuro: A&Ox4, CN II-XII intact, strength 5/5 in all 4 extremities Psyche: normal mood and affect   CBC    Component Value Date/Time   WBC 5.1 09/13/2017 1100   RBC 4.70 09/13/2017 1100   HGB 14.3 09/13/2017 1100   HGB 12.8 08/08/2014 1450   HCT 41.6 09/13/2017 1100   HCT 39.3 08/08/2014 1450   PLT 200 09/13/2017 1100   PLT 173 08/08/2014 1450   MCV 88.5 09/13/2017 1100   MCV 89.4 08/08/2014 1450   MCH 30.4 09/13/2017 1100   MCHC 34.4 09/13/2017 1100   RDW 12.1 09/13/2017 1100   RDW 13.0 08/08/2014 1450   LYMPHSABS 1,270 09/13/2017 1100   LYMPHSABS 1.0 08/08/2014 1450   MONOABS 0.2 08/08/2014 1450   EOSABS 148 09/13/2017 1100   EOSABS 0.1 08/08/2014 1450   BASOSABS 31 09/13/2017 1100   BASOSABS 0.0 08/08/2014 1450     Chest imaging:  PFT:  Labs: 2019 positive ANA 1: 1280 centromere, anticardiolipin IgM 29,  (SCL 70, RNP, Smith, SSA, SSB, dsDNA negative, lupus anticoagulant negative, beta-2 neg    Path:  Echo:  Heart Catheterization:  Records from her visit with Dr. Herminio Heads as you are in May 2019 reviewed where there was concern for scleroderma.  The patient had previously been treated for rheumatoid arthritis but apparently physical exam and hand imaging did not show evidence of erosive arthritis.  She was treated with Plaquenil.  A follow-up visit later in the month noted a diagnosis of  scleroderma.  It was described as limited systemic sclerosis.     Assessment & Plan:   Scleroderma ([place]) - Plan: Pulmonary function test, DG Chest 2 View  Cough - Plan: Pulmonary function test, DG Chest 2 View  Smoker - Plan: Ambulatory referral to Pulmonology  Discussion: This is a pleasant 57 year old female who comes to my clinic today for screening for underlying systemic sclerosis related lung disease.  Fortunately she has no symptoms to suggest this but I think it is a good idea to get chest imaging and a lung function test.  She is at increased risk for lung disease because of her tobacco use.  We talked about this at length today.  She is currently not willing to quit smoking cigarettes.  We did spend a significant amount of time talking about the downside of smoking and its risk in terms of causing long-term lung disease.  Plan: Systemic sclerosis: We will check a lung function test to make sure there is no evidence of underlying lung disease We will check a chest x-ray  Tobacco use: I strongly suggest that you quit smoking We will make referral to the lung cancer screening program  Follow-up with [place] if there is an abnormality on the lung function test or chest x-ray, otherwise follow-up can be on an as-needed basis    Current Outpatient Medications:  .  ALPRAZolam (XANAX) 0.25 MG tablet, Take 0.25 mg by mouth at bedtime. , Disp: , Rfl: 0

## 2017-11-16 NOTE — Patient Instructions (Signed)
Systemic sclerosis: We will check a lung function test to make sure there is no evidence of underlying lung disease We will check a chest x-ray  Tobacco use: I strongly suggest that you quit smoking We will make referral to the lung cancer screening program  Follow-up with [place] if there is an abnormality on the lung function test or chest x-ray, otherwise follow-up can be on an as-needed basis

## 2017-11-16 NOTE — Progress Notes (Signed)
PFT done today. 

## 2017-11-17 ENCOUNTER — Other Ambulatory Visit: Payer: Self-pay | Admitting: Pulmonary Disease

## 2017-11-17 DIAGNOSIS — J441 Chronic obstructive pulmonary disease with (acute) exacerbation: Secondary | ICD-10-CM

## 2017-11-17 NOTE — Progress Notes (Signed)
HRCT ordered per result note from Dr. Lake Bells.

## 2017-11-25 ENCOUNTER — Ambulatory Visit (INDEPENDENT_AMBULATORY_CARE_PROVIDER_SITE_OTHER)
Admission: RE | Admit: 2017-11-25 | Discharge: 2017-11-25 | Disposition: A | Discharge status: Home / Self Care | Payer: Commercial Managed Care - PPO | Source: Ambulatory Visit | Attending: Pulmonary Disease | Admitting: Pulmonary Disease

## 2017-11-25 DIAGNOSIS — J441 Chronic obstructive pulmonary disease with (acute) exacerbation: Secondary | ICD-10-CM | POA: Diagnosis not present

## 2017-11-29 ENCOUNTER — Telehealth: Payer: Self-pay | Admitting: Pulmonary Disease

## 2017-11-29 NOTE — Telephone Encounter (Signed)
Notes recorded by Juanito Doom, MD on 11/26/2017 at 5:06 PM EDT M, Please let the patient know this showed some scarring and emphysema, will discuss more on next visit. Thanks, B  Spoke with patient. She is aware of results. Nothing further needed at time of call.

## 2017-12-16 ENCOUNTER — Telehealth (HOSPITAL_COMMUNITY): Payer: Self-pay

## 2017-12-16 NOTE — Telephone Encounter (Signed)
Pt called and stated that Dr. Lake Bells ordered a PFT test on 12/02/2017. Test results are in chart. I will cancel the PFT test scheduled on 01/07/2018.

## 2017-12-28 ENCOUNTER — Ambulatory Visit: Payer: Self-pay | Admitting: Pulmonary Disease

## 2018-01-05 ENCOUNTER — Ambulatory Visit (INDEPENDENT_AMBULATORY_CARE_PROVIDER_SITE_OTHER): Payer: Commercial Managed Care - PPO | Admitting: Pulmonary Disease

## 2018-01-05 ENCOUNTER — Encounter: Payer: Self-pay | Admitting: Pulmonary Disease

## 2018-01-05 VITALS — BP 116/70 | HR 83 | Ht 65.75 in | Wt 125.4 lb

## 2018-01-05 DIAGNOSIS — J441 Chronic obstructive pulmonary disease with (acute) exacerbation: Secondary | ICD-10-CM | POA: Diagnosis not present

## 2018-01-05 DIAGNOSIS — M349 Systemic sclerosis, unspecified: Secondary | ICD-10-CM

## 2018-01-05 DIAGNOSIS — F17200 Nicotine dependence, unspecified, uncomplicated: Secondary | ICD-10-CM | POA: Diagnosis not present

## 2018-01-05 NOTE — Patient Instructions (Signed)
COPD: If you develop chest tightness, wheezing, or shortness of breath please call us and let us know so that we can write a prescription for an inhaler and see you in clinic Practice good hand hygiene If you change your min mind d about a flu shot please let us know Stay active Quit smoking  Tobacco abuse: Quit smoking  We will see you back on an as-needed basis

## 2018-01-05 NOTE — Progress Notes (Signed)
Synopsis: Referred in July 2019 for wheezing, has scleroderma, found to have emphysema.  Had previously been diagnosed with rheumatoid arthritis but did not have erosive joint abnormalities. Has a history of breast cancer.   Subjective:   PATIENT ID: Doris Bender GENDER: female DOB: 12-Jan-1961, MRN: [id number]   HPI  Chief Complaint  Patient presents with  . Follow-up    HRCT follow up    Timmothy Sours is here to see me today for follow-up from a recent CT scan of her chest which showed evidence of centrilobular emphysema and a lung function test that showed COPD.  She says that her breathing has been the same since the last visit.  She denies any problems with shortness of breath when she tries to exert herself.  She says that she has not had problems with bronchitis since about 8 months ago.  Unfortunately she continues to smoke cigarettes every day.  She is here today to follow-up with the results of these tests.   Past Medical History:  Diagnosis Date  . Arthritis    lt great toe (RA)  . Breast cancer ([place]) 11/15/12   left  . Cancer ([place])    breast  . Compression fracture of L4 lumbar vertebra 11/04/2012   injury  . Contact lens/glasses fitting    wears contacts or glasses  . Gout 8-9 yrs ago   Rt heel/foot  . History of radiation therapy 02/20/13-04/07/13   left breast 50.4 gray, upper outer quadrant boosted to 62.4 gray  . Osteoporosis   . Pseudogout of joint of left foot 6 yrs ago  . Raynaud's disease    bilateral hands and feet  . Wears partial dentures    partial lower      Review of Systems  Constitutional: Negative for chills, fever, malaise/fatigue and weight loss.  HENT: Negative for congestion, nosebleeds, sinus pain and sore throat.   Eyes: Negative for photophobia, pain and discharge.  Respiratory: Negative for cough, hemoptysis, sputum production, shortness of breath and wheezing.   Cardiovascular: Negative for chest pain, palpitations, orthopnea and leg  swelling.  Gastrointestinal: Negative for abdominal pain, constipation, diarrhea, nausea and vomiting.  Genitourinary: Negative for dysuria, frequency, hematuria and urgency.  Musculoskeletal: Negative for back pain, joint pain, myalgias and neck pain.  Skin: Negative for itching and rash.  Neurological: Negative for tingling, tremors, sensory change, speech change, focal weakness, seizures, weakness and headaches.  Psychiatric/Behavioral: Negative for memory loss, substance abuse and suicidal ideas. The patient is not nervous/anxious.       Objective:  Physical Exam   Vitals:   01/05/18 1620  BP: 116/70  Pulse: 83  SpO2: 100%  Weight: 125 lb 6.4 oz (56.9 kg)  Height: 5' 5.75" (1.67 m)    RA  Gen: well appearing HENT: OP clear, TM's clear, neck supple PULM: CTA B, normal percussion CV: RRR, no mgr, trace edema GI: BS+, soft, nontender Derm: no cyanosis or rash Psyche: normal mood and affect    CBC    Component Value Date/Time   WBC 5.1 09/13/2017 1100   RBC 4.70 09/13/2017 1100   HGB 14.3 09/13/2017 1100   HGB 12.8 08/08/2014 1450   HCT 41.6 09/13/2017 1100   HCT 39.3 08/08/2014 1450   PLT 200 09/13/2017 1100   PLT 173 08/08/2014 1450   MCV 88.5 09/13/2017 1100   MCV 89.4 08/08/2014 1450   MCH 30.4 09/13/2017 1100   MCHC 34.4 09/13/2017 1100   RDW 12.1 09/13/2017 1100  RDW 13.0 08/08/2014 1450   LYMPHSABS 1,270 09/13/2017 1100   LYMPHSABS 1.0 08/08/2014 1450   MONOABS 0.2 08/08/2014 1450   EOSABS 148 09/13/2017 1100   EOSABS 0.1 08/08/2014 1450   BASOSABS 31 09/13/2017 1100   BASOSABS 0.0 08/08/2014 1450     Chest imaging: 11/2017 HRCT> images personally reviewed showing upper lobe predominant mild centrilobular emphysema, no other pulmonary parenchymal abnormalities   PFT: July 2019 full pulmonary function testing showed a ratio of 59%, FEV1 1.48 L improved to 1.57 L 58% predicted, total lung capacity 5.28 L 101% predicted, residual volume 137%  predict, DLCO 14.6 mL 57% predicted  Labs: 2019 positive ANA 1: 1280 centromere, anticardiolipin IgM 29,  (SCL 70, RNP, Smith, SSA, SSB, dsDNA negative, lupus anticoagulant negative, beta-2 neg    Path:  Echo:  Heart Catheterization:       Assessment & Plan:   Chronic obstructive pulmonary disease with acute exacerbation (HCC)  Scleroderma ([place])  Smoker  Discussion: She returns to my clinic today to go over the findings from the CT scan and the lung function test which showed centrilobular emphysema and moderate airflow obstruction.  She does not have day-to-day symptoms of shortness of breath so she is considered gold grade A.  I explained to her today that her COPD will continue to worsen in the setting of ongoing tobacco use.  At this time she says that it is very difficult for her to quit due to severe anxiety.  I explained to her that we can help her with counseling and coping mechanisms to try to help get through this.  Right now she is not ready to quit smoking.  She refuses a flu shot.  Because she has no symptoms she does not need medical treatment.  Plan: COPD: If you develop chest tightness, wheezing, or shortness of breath please call us and let us know so that we can write a prescription for an inhaler and see you in clinic Practice good hand hygiene If you change your min mind d about a flu shot please let us know Stay active Quit smoking  Tobacco abuse: Quit smoking  We will see you back on an as-needed basis     Current Outpatient Medications:  .  ALPRAZolam (XANAX) 0.25 MG tablet, Take 0.25 mg by mouth at bedtime. , Disp: , Rfl: 0

## 2018-01-07 ENCOUNTER — Encounter (HOSPITAL_COMMUNITY): Payer: Self-pay | Admitting: Internal Medicine

## 2018-01-07 ENCOUNTER — Ambulatory Visit (HOSPITAL_COMMUNITY)
Admission: RE | Admit: 2018-01-07 | Discharge: 2018-01-07 | Disposition: A | Discharge status: Home / Self Care | Payer: Commercial Managed Care - PPO | Source: Ambulatory Visit | Attending: Internal Medicine | Admitting: Internal Medicine

## 2018-01-07 ENCOUNTER — Encounter (HOSPITAL_COMMUNITY): Payer: Self-pay

## 2018-01-07 ENCOUNTER — Ambulatory Visit (HOSPITAL_BASED_OUTPATIENT_CLINIC_OR_DEPARTMENT_OTHER)
Admission: RE | Admit: 2018-01-07 | Discharge: 2018-01-07 | Disposition: A | Discharge status: Home / Self Care | Payer: Commercial Managed Care - PPO | Source: Ambulatory Visit | Attending: Internal Medicine | Admitting: Internal Medicine

## 2018-01-07 ENCOUNTER — Other Ambulatory Visit: Payer: Self-pay

## 2018-01-07 VITALS — BP 110/65 | HR 80 | Wt 124.5 lb

## 2018-01-07 DIAGNOSIS — J449 Chronic obstructive pulmonary disease, unspecified: Secondary | ICD-10-CM | POA: Insufficient documentation

## 2018-01-07 DIAGNOSIS — R079 Chest pain, unspecified: Secondary | ICD-10-CM

## 2018-01-07 DIAGNOSIS — Z881 Allergy status to other antibiotic agents status: Secondary | ICD-10-CM | POA: Insufficient documentation

## 2018-01-07 DIAGNOSIS — I7300 Raynaud's syndrome without gangrene: Secondary | ICD-10-CM | POA: Insufficient documentation

## 2018-01-07 DIAGNOSIS — I445 Left posterior fascicular block: Secondary | ICD-10-CM | POA: Diagnosis not present

## 2018-01-07 DIAGNOSIS — F439 Reaction to severe stress, unspecified: Secondary | ICD-10-CM

## 2018-01-07 DIAGNOSIS — M069 Rheumatoid arthritis, unspecified: Secondary | ICD-10-CM | POA: Diagnosis not present

## 2018-01-07 DIAGNOSIS — Z885 Allergy status to narcotic agent status: Secondary | ICD-10-CM | POA: Insufficient documentation

## 2018-01-07 DIAGNOSIS — M810 Age-related osteoporosis without current pathological fracture: Secondary | ICD-10-CM | POA: Insufficient documentation

## 2018-01-07 DIAGNOSIS — M25561 Pain in right knee: Secondary | ICD-10-CM | POA: Diagnosis not present

## 2018-01-07 DIAGNOSIS — F17210 Nicotine dependence, cigarettes, uncomplicated: Secondary | ICD-10-CM | POA: Insufficient documentation

## 2018-01-07 DIAGNOSIS — M349 Systemic sclerosis, unspecified: Secondary | ICD-10-CM

## 2018-01-07 DIAGNOSIS — Z888 Allergy status to other drugs, medicaments and biological substances status: Secondary | ICD-10-CM | POA: Diagnosis not present

## 2018-01-07 DIAGNOSIS — Z853 Personal history of malignant neoplasm of breast: Secondary | ICD-10-CM | POA: Diagnosis not present

## 2018-01-07 DIAGNOSIS — Z886 Allergy status to analgesic agent status: Secondary | ICD-10-CM | POA: Diagnosis not present

## 2018-01-07 DIAGNOSIS — Z79899 Other long term (current) drug therapy: Secondary | ICD-10-CM | POA: Insufficient documentation

## 2018-01-07 DIAGNOSIS — M25562 Pain in left knee: Secondary | ICD-10-CM | POA: Insufficient documentation

## 2018-01-07 DIAGNOSIS — I517 Cardiomegaly: Secondary | ICD-10-CM | POA: Insufficient documentation

## 2018-01-07 NOTE — Addendum Note (Signed)
Encounter addended by: Harvie Junior, CMA on: 01/07/2018 4:03 PM  Actions taken: Sign clinical note, Order list changed, Diagnosis association updated

## 2018-01-07 NOTE — Progress Notes (Signed)
Advanced [facility] Consult Note   Referring Physician: PCP: Rory Percy, MD PCP-Cardiologist: No primary care provider on file.   Referring MD: Dr. Arlean Hopping   HPI:  Doris Bender is a 57 y.o. female with h/o systemic sclerosis, raynauds phenomenon, osteoporosis, rheumatoid arthritis, tobacco abuse, breast cancer (treated with lumpectomy but no chemo as she could not tolerate) and COPD/emphysema.  Seen early this 09/2017 with bilateral knee pain with chronic component. Labwork revealed RA and scleroderma. She stated she had previously been treated for pseudogout and RA "20 years ago", but had stopped her medicine.   Pt has known systemic sclerosis and was sent to Dr. Lake Bells for evaluation for lung involvement. PFTs and CT chest as below with emphysema/COPD. No evidence of ILD.   She presents today for pulmonary hypertension screening in the setting of systemic sclerosis at the request of her Rheumatologist, Dr. Estanislado Pandy.   Overall doing fairly well. Works [employer] for [employer] Feels well. Very mild SOB. Can do almost anything she wants to do. No edema, orthopnea or PND. Had CP in setting of panic attack about 30 years ago. Gets occasional CP in setting of anxiety but not with exertion.  Continues to smoke 1/2 ppd (cut down from 1PPD)  Echo today EF 65-65% Normal RV Normal diastolic function. IVC small Personally reviewed   PFTs 11/16/17 FVC 2.48 (71%)  FEV1 1.48 (55%) TLC 5.28 (101%) DLCO 14.62 (57%)  CT Chest High Res 11/25/17 1. No evidence of interstitial lung disease. 2. Mild radiation fibrosis in the anterior left upper lobe. 3. Moderate centrilobular and mild paraseptal emphysema with mild diffuse bronchial wall thickening, compatible with the provided history of COPD. 4. One vessel coronary atherosclerosis.  Pertinent Labs 09/2017 positive ANA 1: 1280 centromere 09/2017 anticardiolipin IgM 29 (negative) 09/2017 SCL 70, RNP, Smith, SSA, SSB,  dsDNA negative, lupus anticoagulant negative, beta-2 neg  Review of Systems: [y] = yes, [ ]  = no   General: Weight gain [ ] ; Weight loss [ ] ; Anorexia [ ] ; Fatigue [ ] ; Fever [ ] ; Chills [ ] ; Weakness [ ]   Cardiac: Chest pain/pressure y[ ] ; Resting SOB [ ] ; Exertional SOB [ y]; Orthopnea [ ] ; Pedal Edema [ ] ; Palpitations [ ] ; Syncope [ ] ; Presyncope [ ] ; Paroxysmal nocturnal dyspnea[ ]   Pulmonary: Cough [ ] ; Wheezing[ ] ; Hemoptysis[ ] ; Sputum [ ] ; Snoring [ ]   GI: Vomiting[ ] ; Dysphagia[ ] ; Melena[ ] ; Hematochezia [ ] ; Heartburn[ ] ; Abdominal pain [ ] ; Constipation [ ] ; Diarrhea [ ] ; BRBPR [ ]   GU: Hematuria[ ] ; Dysuria [ ] ; Nocturia[ ]   Vascular: Pain in legs with walking [ ] ; Pain in feet with lying flat [ ] ; Non-healing sores [ ] ; Stroke [ ] ; TIA [ ] ; Slurred speech [ ] ;  Neuro: Headaches[ ] ; Vertigo[ ] ; Seizures[ ] ; Paresthesias[ ] ;Blurred vision [ ] ; Diplopia [ ] ; Vision changes [ ]   Ortho/Skin: Arthritis [ y]; Joint pain [ y]; Muscle pain [ ] ; Joint swelling [ ] ; Back Pain [ ] ; Rash [ ]   Psych: Depression[ ] ; Anxiety[y ]  Heme: Bleeding problems [ ] ; Clotting disorders [ ] ; Anemia [ ]   Endocrine: Diabetes [ ] ; Thyroid dysfunction[ ]    Past Medical History:  Diagnosis Date  . Arthritis    lt great toe (RA)  . Breast cancer ([place]) 11/15/12   left  . Cancer ([place])    breast  . Compression fracture of L4 lumbar vertebra 11/04/2012   injury  . Contact lens/glasses  fitting    wears contacts or glasses  . Gout 8-9 yrs ago   Rt heel/foot  . History of radiation therapy 02/20/13-04/07/13   left breast 50.4 gray, upper outer quadrant boosted to 62.4 gray  . Osteoporosis   . Pseudogout of joint of left foot 6 yrs ago  . Raynaud's disease    bilateral hands and feet  . Wears partial dentures    partial lower    Current Outpatient Medications  Medication Sig Dispense Refill  . ALPRAZolam (XANAX) 0.25 MG tablet Take 0.25 mg by mouth at bedtime.   0   No current facility-administered  medications for this encounter.     Allergies  Allergen Reactions  . Hydroxychloroquine Nausea And Vomiting and Other (See Comments)    Back, stomach/ abd pain.  . Prednisone Nausea And Vomiting    Other reaction(s): Abdominal Pain  . Doxycycline     dizzy  . Ibuprofen Hives  . Keflex [Cephalexin] Hives  . Ketorolac Hives  . Lodine [Etodolac] Hives  . Methocarbamol Nausea Only  . Oxycodone Nausea And Vomiting  . Oxycodone Nausea Only  . Robaxin [Methocarbamol] Hives      Social History   Socioeconomic History  . Marital status: Widowed    Spouse name: Not on file  . Number of children: Not on file  . Years of education: Not on file  . Highest education level: Not on file  Occupational History  . Not on file  Social Needs  . Financial resource strain: Not on file  . Food insecurity:    Worry: Not on file    Inability: Not on file  . Transportation needs:    Medical: Not on file    Non-medical: Not on file  Tobacco Use  . Smoking status: Current Every Day Smoker    Packs/day: 0.50    Years: 20.00    Pack years: 10.00    Types: Cigarettes  . Smokeless tobacco: Never Used  . Tobacco comment: 1ppd or less  Substance and Sexual Activity  . Alcohol use: Yes    Comment: rare 1 yearly  . Drug use: No  . Sexual activity: Not on file    Comment: menarche age 7, miscarriage x 1, P85,  age 27 w/birth of son, Depo-Provera x 20 yrs  Lifestyle  . Physical activity:    Days per week: Not on file    Minutes per session: Not on file  . Stress: Not on file  Relationships  . Social connections:    Talks on phone: Not on file    Gets together: Not on file    Attends religious service: Not on file    Active member of club or organization: Not on file    Attends meetings of clubs or organizations: Not on file    Relationship status: Not on file  . Intimate partner violence:    Fear of current or ex partner: Not on file    Emotionally abused: Not on file    Physically  abused: Not on file    Forced sexual activity: Not on file  Other Topics Concern  . Not on file  Social History Narrative   ** Merged History Encounter **          Family History  Problem Relation Age of Onset  . Hyperthyroidism Mother   . Cirrhosis Father   . Diabetes Brother   . Heart attack Maternal Grandmother   . Heart attack Maternal Grandfather  Vitals:   01/07/18 1502  BP: 110/65  Pulse: 80  SpO2: 99%  Weight: 56.5 kg (124 lb 8 oz)    Wt Readings from Last 3 Encounters:  01/07/18 56.5 kg (124 lb 8 oz)  01/05/18 56.9 kg (125 lb 6.4 oz)  11/16/17 56.2 kg (124 lb)    PHYSICAL EXAM: General:  Well appearing. No respiratory difficulty HEENT: normal Neck: supple. no JVD. Carotids 2+ bilat; no bruits. No lymphadenopathy or thyromegaly appreciated. Cor: PMI nondisplaced. Regular rate & rhythm. No rubs, gallops or murmurs. Lungs: clear with decreased BS throughout No wheezingg Abdomen: soft, nontender, nondistended. No hepatosplenomegaly. No bruits or masses. Good bowel sounds. Extremities: no cyanosis, clubbing, rash, edema Neuro: alert & oriented x 3, cranial nerves grossly intact. moves all 4 extremities w/o difficulty. Affect pleasant.  ECG: NSR [id number].LPFB. No st t wave abnormalities. Personally reviewed   ASSESSMENT & PLAN:  1. Pulmonary HTN screening - Echo with no evidence of PAH or RV strain (Personally reviewed) - PFTs with mild to moderated decreased diffusion defect due to COPD. No evidence of ILD on hi-res CT of chst) - Will repeat screenig  in 1 year  2. COPD - Follows with Dr. Lake Bells - Gold Grade A, with no day to day symptoms.  - Working on quitting.   3. Tobacco Abuse - Encouraged complete cessation.   4. Chest pain - this seems to be related to anxiety. She is very active without anginal symptoms. She knows to contact me if she gets CP with exertion.    Glori Bickers, MD  3:59 PM

## 2018-01-07 NOTE — Progress Notes (Signed)
  Echocardiogram 2D Echocardiogram has been performed.  Merrie Roof F 01/07/2018, 3:10 PM

## 2018-01-07 NOTE — Patient Instructions (Addendum)
PFTs, Echocardiogram, and follow up in 12 months.

## 2018-03-02 ENCOUNTER — Ambulatory Visit (INDEPENDENT_AMBULATORY_CARE_PROVIDER_SITE_OTHER): Payer: Self-pay

## 2018-03-02 ENCOUNTER — Encounter (INDEPENDENT_AMBULATORY_CARE_PROVIDER_SITE_OTHER): Payer: Self-pay | Admitting: Orthopedic Surgery

## 2018-03-02 ENCOUNTER — Ambulatory Visit (INDEPENDENT_AMBULATORY_CARE_PROVIDER_SITE_OTHER): Payer: Commercial Managed Care - PPO | Admitting: Orthopedic Surgery

## 2018-03-02 DIAGNOSIS — M545 Low back pain, unspecified: Secondary | ICD-10-CM

## 2018-03-04 ENCOUNTER — Encounter (INDEPENDENT_AMBULATORY_CARE_PROVIDER_SITE_OTHER): Payer: Self-pay | Admitting: Orthopedic Surgery

## 2018-03-04 NOTE — Progress Notes (Signed)
Office Visit Note   Patient: Doris Bender           Date of Birth: 1960-06-14           MRN: [id number] Visit Date: 03/02/2018 Requested by: Rory Percy, MD [place], [place] [id number] PCP: Rory Percy, MD  Subjective: Chief Complaint  Patient presents with  . Lower Back - Pain    HPI: Fred is a patient with low back pain since 4 days ago.  Denies any history of injury.  The pain has become significantly worse.  She describes a "freezing" type pain.  Monday at work her symptoms were worse.  Yesterday she took a day off.  Denies any numbness and tingling in the legs.  Describes a catching type pain.  She describes a pain similar to the pain she had with her L4 compression fracture.  She has previously had 2 injections with Dr. Ernestina Patches.  She takes occasional one half Vicodin.              ROS: All systems reviewed are negative as they relate to the chief complaint within the history of present illness.  Patient denies  fevers or chills.   Assessment & Plan: Visit Diagnoses:  1. Acute midline low back pain without sciatica     Plan: Impression is acute low back pain with history of L4 compression fracture previously in a patient has osteoporosis.  No acute fracture seen today.  I think she may have impending stress fracture in 1 of these vertebral bodies or she may have a sacral insufficiency fracture that we can see on plain radiographs.  She needs MRI scan to evaluate for both.  I will see her back after that study.  Out of work until 03/21/2018 due to severity of pain and inability to really do much of anything in terms of physical activity.  No red flag symptoms of fevers and chills or bowel bladder symptoms today.  Follow-Up Instructions: Return for after MRI.   Orders:  Orders Placed This Encounter  Procedures  . XR Lumbar Spine 2-3 Views  . MR Lumbar Spine w/o contrast   No orders of the defined types were placed in this encounter.     Procedures: No  procedures performed   Clinical Data: No additional findings.  Objective: Vital Signs: There were no vitals taken for this visit.  Physical Exam:   Constitutional: Patient appears well-developed HEENT:  Head: Normocephalic Eyes:EOM are normal Neck: Normal range of motion Cardiovascular: Normal rate Pulmonary/chest: Effort normal Neurologic: Patient is alert Skin: Skin is warm Psychiatric: Patient has normal mood and affect    Ortho Exam: Ortho exam demonstrates full active and passive range of motion of knees ankles and hips.  She does have a lot of pain getting up from the chair.  Pain with extension and flexion.  Negative Babinski negative clonus reflexes symmetric she has normal muscle tone in the legs.  No masses lymphadenopathy or skin changes noted in the back region  Specialty Comments:  No specialty comments available.  Imaging: No results found.   PMFS History: Patient Active Problem List   Diagnosis Date Noted  . Smoker 09/24/2017  . Primary osteoarthritis of both hands 09/24/2017  . Autoimmune disease ([place]) 09/24/2017  . History of osteoporosis 09/13/2017  . History of vertebral compression fracture 09/13/2017  . Raynaud's syndrome without gangrene 09/13/2017  . Pseudogout 09/13/2017  . History of migraine 09/13/2017  . Back pain 07/24/2013  .  Compression fracture of L4 vertebra (HCC) 07/24/2013  . Breast cancer of upper-outer quadrant of left female breast ([place]) 10/18/2012   Past Medical History:  Diagnosis Date  . Arthritis    lt great toe (RA)  . Breast cancer ([place]) 11/15/12   left  . Cancer ([place])    breast  . Compression fracture of L4 lumbar vertebra 11/04/2012   injury  . Contact lens/glasses fitting    wears contacts or glasses  . Gout 8-9 yrs ago   Rt heel/foot  . History of radiation therapy 02/20/13-04/07/13   left breast 50.4 gray, upper outer quadrant boosted to 62.4 gray  . Osteoporosis   . Pseudogout of joint of left foot 6 yrs ago   . Raynaud's disease    bilateral hands and feet  . Wears partial dentures    partial lower    Family History  Problem Relation Age of Onset  . Hyperthyroidism Mother   . Cirrhosis Father   . Diabetes Brother   . Heart attack Maternal Grandmother   . Heart attack Maternal Grandfather     Past Surgical History:  Procedure Laterality Date  . BREAST LUMPECTOMY WITH NEEDLE LOCALIZATION AND AXILLARY SENTINEL LYMPH NODE BX Left 12/21/2012   Procedure: BREAST LUMPECTOMY WITH NEEDLE LOCALIZATION AND AXILLARY SENTINEL LYMPH NODE BX;  Surgeon: Haywood Lasso, MD;  Location: [facility];  Service: General;  Laterality: Left;  needle local BCG    . BREAST SURGERY  age 35   left breast bx- benign  . CERVICAL CONIZATION W/BX  2012   benign  . ELBOW SURGERY    . KNEE ARTHROPLASTY    . KNEE ARTHROSCOPY  2006   right  . LATERAL EPICONDYLE RELEASE  03/08/2012   Procedure: TENNIS ELBOW RELEASE;  Surgeon: Cammie Sickle., MD;  Location: [facility];  Service: Orthopedics;  Laterality: Right;  RECONSTRUCTION OF RIGHT LATERAL ELBOW WITH TENDONS  . MASS EXCISION  03/08/2012   Procedure: EXCISION MASS;  Surgeon: Cammie Sickle., MD;  Location: [facility];  Service: Orthopedics;  Laterality: Right;  EXCISION OF RIGHT WRIST DORSAL CYST  . TENNIS ELBOW RELEASE/NIRSCHEL PROCEDURE Left 09/12/2015   Procedure: ARTHROTOMY LEFT ELBOW, REPAIR EXTENSOR ORIGIN;  Surgeon: Daryll Brod, MD;  Location: [facility];  Service: Orthopedics;  Laterality: Left;  . TONSILLECTOMY    . TONSILLECTOMY     57yrs old  . WRIST GANGLION EXCISION     left x2   Social History   Occupational History  . Not on file  Tobacco Use  . Smoking status: Current Every Day Smoker    Packs/day: 0.50    Years: 20.00    Pack years: 10.00    Types: Cigarettes  . Smokeless tobacco: Never Used  . Tobacco comment: 1ppd or less  Substance and Sexual Activity  . Alcohol use:  Yes    Comment: rare 1 yearly  . Drug use: No  . Sexual activity: Not on file    Comment: menarche age 51, miscarriage x 1, P78,  age 80 w/birth of son, Depo-Provera x 20 yrs

## 2018-03-09 ENCOUNTER — Ambulatory Visit
Admission: RE | Admit: 2018-03-09 | Discharge: 2018-03-09 | Disposition: A | Discharge status: Home / Self Care | Payer: Commercial Managed Care - PPO | Source: Ambulatory Visit | Attending: Orthopedic Surgery | Admitting: Orthopedic Surgery

## 2018-03-09 DIAGNOSIS — M545 Low back pain, unspecified: Secondary | ICD-10-CM

## 2018-03-11 NOTE — Progress Notes (Signed)
Office Visit Note  Patient: Doris Bender             Date of Birth: June 30, 1960           MRN: [id number]             PCP: Rory Percy, MD Referring: Rory Percy, MD Visit Date: 03/24/2018 Occupation: @GUAROCC @  Subjective:  Lower back pain   History of Present Illness: Doris Bender is a 57 y.o. female with history of scleroderma, Raynaud's disease, osteoarthritis, pseudogout, and osteoporosis.  Patient reports that she was evaluated by Dr. Marlou Sa at the end of October for lower back pain.  She states that she has been wearing a back brace intermittently.  She states an x-ray as well as an MRI and will be following up with Dr. Ernestina Patches if she continues to have discomfort from.  Dr. Ernestina Patches has performed injections in the past.  She continues to have intermittent symptoms of right-sided sciatica.  Patient states that she has chronic pain in her left foot.  She reports that the pain is different than pseudogout pain.  She describes the pain as a sharp shooting pain.  She states that the pain has been more frequent than in the past.  She denies any injuries.  She denies any swelling or redness. Patient states that she continues to have intermittent symptoms of Raynaud's.  She uses hand warmers on a regular basis. She denies any digital ulcerations states that her hands are very dry.  She uses silicone glove lotion on a regular basis.  She states that she has skin dryness in her nose as well.  She states that she is using moisturizer as well as vitamin E oil and has noticed much improvement.  She states in the past she seen a dermatologist who biopsied and the findings were consistent with skin damage.  She has not followed up with a dermatologist recently.  She denies any other rashes.     Activities of Daily Living:  Patient reports morning stiffness for 1 hour.   Patient Reports nocturnal pain.  Difficulty dressing/grooming: Reports Difficulty climbing stairs: Reports Difficulty getting  out of chair: Reports Difficulty using hands for taps, buttons, cutlery, and/or writing: Reports  Review of Systems  Constitutional: Positive for fatigue.  HENT: Positive for mouth dryness. Negative for mouth sores, trouble swallowing, trouble swallowing and nose dryness.   Eyes: Positive for dryness. Negative for pain, redness, itching and visual disturbance.  Respiratory: Negative for cough, hemoptysis, shortness of breath, wheezing and difficulty breathing.   Cardiovascular: Negative for chest pain, palpitations, hypertension and swelling in legs/feet.  Gastrointestinal: Negative for abdominal pain, blood in stool, constipation, diarrhea, nausea and vomiting.  Endocrine: Negative for increased urination.  Genitourinary: Negative for painful urination, nocturia and pelvic pain.  Musculoskeletal: Positive for arthralgias, joint pain, joint swelling and morning stiffness. Negative for myalgias, muscle weakness, muscle tenderness and myalgias.  Skin: Positive for rash. Negative for color change, pallor, hair loss, nodules/bumps, skin tightness, ulcers and sensitivity to sunlight.  Allergic/Immunologic: Negative for susceptible to infections.  Neurological: Negative for dizziness, light-headedness, numbness, headaches and memory loss.  Hematological: Negative for bruising/bleeding tendency and swollen glands.  Psychiatric/Behavioral: Negative for depressed mood, confusion and sleep disturbance. The patient is not nervous/anxious.     PMFS History:  Patient Active Problem List   Diagnosis Date Noted  . Smoker 09/24/2017  . Primary osteoarthritis of both hands 09/24/2017  . Autoimmune disease ([place]) 09/24/2017  . History of  osteoporosis 09/13/2017  . History of vertebral compression fracture 09/13/2017  . Raynaud's syndrome without gangrene 09/13/2017  . Pseudogout 09/13/2017  . History of migraine 09/13/2017  . Back pain 07/24/2013  . Compression fracture of L4 vertebra ([facility]) 07/24/2013    . Breast cancer of upper-outer quadrant of left female breast ([facility]) 10/18/2012    Past Medical History:  Diagnosis Date  . Arthritis    lt great toe (RA)  . Breast cancer ([place]) 11/15/12   left  . Cancer ([place])    breast  . Compression fracture of L4 lumbar vertebra 11/04/2012   injury  . Contact lens/glasses fitting    wears contacts or glasses  . Gout 8-9 yrs ago   Rt heel/foot  . History of radiation therapy 02/20/13-04/07/13   left breast 50.4 gray, upper outer quadrant boosted to 62.4 gray  . Osteoporosis   . Pseudogout of joint of left foot 6 yrs ago  . Raynaud's disease    bilateral hands and feet  . Wears partial dentures    partial lower    Family History  Problem Relation Age of Onset  . Hyperthyroidism Mother   . Cirrhosis Father   . Diabetes Brother   . Heart attack Maternal Grandmother   . Heart attack Maternal Grandfather    Past Surgical History:  Procedure Laterality Date  . BREAST LUMPECTOMY WITH NEEDLE LOCALIZATION AND AXILLARY SENTINEL LYMPH NODE BX Left 12/21/2012   Procedure: BREAST LUMPECTOMY WITH NEEDLE LOCALIZATION AND AXILLARY SENTINEL LYMPH NODE BX;  Surgeon: Haywood Lasso, MD;  Location: [facility];  Service: General;  Laterality: Left;  needle local BCG    . BREAST SURGERY  age 61   left breast bx- benign  . CERVICAL CONIZATION W/BX  2012   benign  . ELBOW SURGERY    . KNEE ARTHROPLASTY    . KNEE ARTHROSCOPY  2006   right  . LATERAL EPICONDYLE RELEASE  03/08/2012   Procedure: TENNIS ELBOW RELEASE;  Surgeon: Cammie Sickle., MD;  Location: [facility];  Service: Orthopedics;  Laterality: Right;  RECONSTRUCTION OF RIGHT LATERAL ELBOW WITH TENDONS  . MASS EXCISION  03/08/2012   Procedure: EXCISION MASS;  Surgeon: Cammie Sickle., MD;  Location: [facility];  Service: Orthopedics;  Laterality: Right;  EXCISION OF RIGHT WRIST DORSAL CYST  . TENNIS ELBOW RELEASE/NIRSCHEL PROCEDURE Left 09/12/2015    Procedure: ARTHROTOMY LEFT ELBOW, REPAIR EXTENSOR ORIGIN;  Surgeon: Daryll Brod, MD;  Location: [facility];  Service: Orthopedics;  Laterality: Left;  . TONSILLECTOMY    . TONSILLECTOMY     57yrs old  . WRIST GANGLION EXCISION     left x2   Social History   Social History Narrative   ** Merged History Encounter **        Objective: Vital Signs: BP 108/69 (BP Location: Right Arm, Patient Position: Sitting, Cuff Size: Normal)   Pulse 81   Resp 14   Ht 5\' 5"  (1.651 m)   Wt 127 lb (57.6 kg)   BMI 21.13 kg/m    Physical Exam  Constitutional: She is oriented to person, place, and time. She appears well-developed and well-nourished.  HENT:  Head: Normocephalic and atraumatic.  Eyes: Conjunctivae and EOM are normal.  Neck: Normal range of motion.  Cardiovascular: Normal rate, regular rhythm, normal heart sounds and intact distal pulses.  Pulmonary/Chest: Effort normal and breath sounds normal.  Abdominal: Soft. Bowel sounds are normal.  Lymphadenopathy:  She has no cervical adenopathy.  Neurological: She is alert and oriented to person, place, and time.  Skin: Skin is warm and dry. Capillary refill takes 2 to 3 seconds.  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  She had periungual erythema and capillary dropout.   Psychiatric: She has a normal mood and affect. Her behavior is normal.  Nursing note and vitals reviewed.    Musculoskeletal Exam: C-spine good range of motion with no discomfort.  She has limited range of motion of lumbar spine with discomfort.  She has midline spinal tenderness in the lumbar region.  Shoulder joints, elbow joints, wrist joints, MCPs and PIPs, DIPs good range of motion with no synovitis.  PIP and DIP synovial thickening consistent with osteoarthritis.  Hip joints, knee joints, ankle joints, MTPs, PIPs, and DIPs good ROM with no synovitis.  No warmth or effusion of knee joints.  Bilateral knee joint crepitus.   No tenderness or swelling of ankle joints.  PIP and DIP synovial thickening of both feet.  Tenderness of the left 1st MTP joint.    CDAI Exam: CDAI Score: Not documented Patient Global Assessment: Not documented; Provider Global Assessment: Not documented Swollen: Not documented; Tender: Not documented Joint Exam   Not documented   There is currently no information documented on the homunculus. Go to the Rheumatology activity and complete the homunculus joint exam.  Investigation: No additional findings.  Imaging: Mr Lumbar Spine W/o Contrast  Result Date: 03/09/2018 CLINICAL DATA:  Acute low back pain. History of L4 compression fracture. EXAM: MRI LUMBAR SPINE WITHOUT CONTRAST TECHNIQUE: Multiplanar, multisequence MR imaging of the lumbar spine was performed. No intravenous contrast was administered. COMPARISON:  11/19/2012 FINDINGS: Segmentation:  Standard. Alignment: Mild lumbar dextroscoliosis. Bilateral L5 pars defects without significant listhesis. Vertebrae: Chronic L4 compression fracture with resolved marrow edema and mildly progressive vertebral body height loss, now 45% anteriorly. No new fracture or suspicious osseous lesion. Minimal left facet edema at L3-4. Conus medullaris and cauda equina: Conus extends to the upper L1 level. Conus and cauda equina appear normal. Paraspinal and other soft tissues: Unremarkable. Disc levels: Disc desiccation and mild disc space narrowing at L3-4 and L4-5. T12-L1: Small right paracentral disc protrusion without stenosis, unchanged when comparing to the prior study sagittal imaging. L1-2: Negative. L2-3: Mild facet hypertrophy without disc herniation or stenosis, unchanged. L3-4: Circumferential disc osteophyte complex and moderate right and severe left facet hypertrophy result in progressive, moderate right and mild-to-moderate left lateral recess stenosis and new mild right neural foraminal stenosis without significant generalized spinal stenosis.  L4-5: Central disc protrusion with annular fissure and mild facet arthrosis without stenosis, unchanged. L5-S1: Minimal disc bulging without significant stenosis. IMPRESSION: 1. Chronic L4 compression fracture with mildly progressive height loss. 2. No acute fracture. 3. Progressive moderate right greater than left lateral recess and mild right neural foraminal stenosis at L3-4. Electronically Signed   By: Logan Bores M.D.   On: 03/09/2018 23:06   Xr Lumbar Spine 2-3 Views  Result Date: 03/04/2018 AP lateral lumbar spine reviewed.  Old L4 compression fracture is again noted with no acute change.  Degenerative changes present throughout the facet joints and lumbar spine but no acute compression fracture is seen.   Recent Labs: Lab Results  Component Value Date   WBC 5.1 09/13/2017   HGB 14.3 09/13/2017   PLT 200 09/13/2017   NA 140 09/13/2017   K 4.1 09/13/2017   CL 100 09/13/2017   CO2 31 09/13/2017  GLUCOSE 91 09/13/2017   BUN 11 09/13/2017   CREATININE 0.77 09/13/2017   BILITOT 0.6 09/13/2017   ALKPHOS 83 01/05/2016   AST 13 09/13/2017   ALT 9 09/13/2017   PROT 6.7 09/13/2017   ALBUMIN 4.3 01/05/2016   CALCIUM 9.3 09/13/2017   GFRAA 99 09/13/2017    Speciality Comments: No specialty comments available.  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  She has capillary refill 2-3 seconds.  She continues to have intermittent symptoms of Raynaud's but no digital ulcerations  were noted. No synovitis noted.  She is not experiencing dysphagia, chest pain, or shortness of breath.  She was evaluated by Dr. Lake Bells. She had a PFT on 11/16/2017 that revealed findings consistent with COPD/emphysema.  COPD Gold grade a she was encouraged to try to quit smoking.  CT chest high resolution on 11/25/17 revealed mild radiation fibrosis of the anterior left upper lobe. No evidence of ILD.  She was evaluated by Dr. Haroldine Laws on 01/07/18.  Echo 01/07/18 did not reveal any evidence of pulmonary hypertension her RV strain.  He recommended repeat echo in 1 year. We will check routine CBC, CMP, and UA today.   - Plan: COMPLETE METABOLIC PANEL WITH GFR, CBC with Differential/Platelet, Urinalysis, Routine w reflex microscopic  Raynaud's disease without gangrene: She continues to have intermittent symptoms of Raynaud's.  She has no digital ulcerations on exam.  She has skin dryness in both hands and is encouraged to use Vaseline or Aquaphor on a regular basis.  She uses hand warmers and was encouraged to wear gloves and keep her core body temperature warm.  Primary osteoarthritis of both hands: She has PIP and DIP synovial thickening consistent with also arthritis of bilateral hands.  She has no discomfort in her hands at this time.  No synovitis noted.  Joint protection and muscle strengthening were discussed.  Pseudogout - Diagnosed by Dr. Jerene Pitch in the past.  Patient states her crystals were positive.  She has not had a gout flare for 20 years per patient.   Acute right-sided low back pain with right-sided sciatica:Patient was evaluated by Dr. Marlou Sa on 03/02/2018 for lower back pain.  X-ray of the lumbar spine was obtained and revealed an old L4 compression fracture and degenerative changes throughout the facet joints and lumbar spine.  MRI was ordered on 03/09/2018 that revealed a chronic L4 compression fracture with mildly progressive height loss no acute fracture was noted.  She has progressive  moderate right greater than left lateral recess and mild right foot narrowing foraminal stenosis at L3-L4.  She has been wearing a back brace intermittently.  Her pain has improved.  She continues to have intermittent right-sided sciatica.  She has return to work and was advised to let Dr. Marlou Sa no when she would like to move forward with injections.  She has had 2 injections performed by Dr. Ernestina Patches in the past.  Foot pain, left: She has intermittent left foot pain.  She describes the pain as sharp shooting pain.  She has tenderness of the left first MTP joint but no swelling or  erythema was noted.  She had a x-ray performed on 07/05/2017 that revealed severe end-stage first MTP arthritis but no erosions were noted.  We discussed the importance of wearing proper fitting shoes.    History of vertebral compression fracture: Old compression fracture at L4 noted on MRI on 03/09/18.  Other medical conditions are listed as follows:   Smoker  Malignant neoplasm of upper-outer quadrant of left female breast, unspecified estrogen receptor status ([place])  History of osteoporosis  History of migraine    Orders: Orders Placed This Encounter  Procedures  . COMPLETE METABOLIC PANEL WITH GFR  . CBC with Differential/Platelet  . Urinalysis, Routine w reflex microscopic   No orders of the defined types were placed in this encounter.   Face-to-face time spent with patient was 30 minutes. Greater than 50% of time was spent in counseling and coordination of care.  Follow-Up Instructions: Return in about 5 months (around 08/23/2018) for Scleroderma, Osteoarthritis, Raynaud's syndrome.   Ofilia Neas, PA-C   I examined and evaluated the patient with Hazel Sams PA.  Patient continues to have some Raynaud's symptoms.  Protective clothing was discussed.  She also has some DIP and PIP thickening due to underlying osteoarthritis.  Her feet symptoms are related to osteoarthritis as well.  The plan of care was  discussed as noted above.  Bo Merino, MD  Note - This record has been created using Editor, commissioning.  Chart creation errors have been sought, but may not always  have been located. Such creation errors do not reflect on  the standard of medical care.

## 2018-03-17 ENCOUNTER — Encounter (INDEPENDENT_AMBULATORY_CARE_PROVIDER_SITE_OTHER): Payer: Self-pay | Admitting: Orthopedic Surgery

## 2018-03-17 ENCOUNTER — Ambulatory Visit (INDEPENDENT_AMBULATORY_CARE_PROVIDER_SITE_OTHER): Payer: Commercial Managed Care - PPO | Admitting: Orthopedic Surgery

## 2018-03-17 DIAGNOSIS — M545 Low back pain, unspecified: Secondary | ICD-10-CM

## 2018-03-18 ENCOUNTER — Encounter (INDEPENDENT_AMBULATORY_CARE_PROVIDER_SITE_OTHER): Payer: Self-pay | Admitting: Orthopedic Surgery

## 2018-03-18 NOTE — Progress Notes (Signed)
Office Visit Note   Patient: Doris Bender           Date of Birth: 04-28-1961           MRN: [id number] Visit Date: 03/17/2018 Requested by: Rory Percy, MD [place], [place] [id number] PCP: Rory Percy, MD  Subjective: Chief Complaint  Patient presents with  . Lower Back - Follow-up    HPI: Patient presents for follow-up of MRI scan.  She is having some slight tingling down the right leg.  MRI scan shows chronic L4 compression fracture with no acute bony edema.  She also has fairly significant stenosis and facet hypertrophy at L3-4.  In general she states she is getting better.  Husband just had knee surgery.  She wants to get back to work.  She is using a brace.              ROS: All systems reviewed are negative as they relate to the chief complaint within the history of present illness.  Patient denies  fevers or chills.   Assessment & Plan: Visit Diagnoses:  1. Acute midline low back pain without sciatica     Plan: Impression is L4-L3 facet hypertrophy with some stenosis at that level.  I thought she may have a new compression fracture but that is not the case.  She is using the brace and improving.  Letter go back to work on Monday and see how she does.  No further indication at this time for any intervention unless she remains symptomatic at which time I would recommend that she consider getting injections with Dr. Ernestina Patches.  Follow-up with me as needed.  Follow-Up Instructions: Return if symptoms worsen or fail to improve.   Orders:  No orders of the defined types were placed in this encounter.  No orders of the defined types were placed in this encounter.     Procedures: No procedures performed   Clinical Data: No additional findings.  Objective: Vital Signs: There were no vitals taken for this visit.  Physical Exam:   Constitutional: Patient appears well-developed HEENT:  Head: Normocephalic Eyes:EOM are normal Neck: Normal range of  motion Cardiovascular: Normal rate Pulmonary/chest: Effort normal Neurologic: Patient is alert Skin: Skin is warm Psychiatric: Patient has normal mood and affect    Ortho Exam: Ortho exam demonstrates good ankle dorsiflexion plantarflexion strength improved gait.  No nerve root tension signs.  The remainder of the exam is normal.  Specialty Comments:  No specialty comments available.  Imaging: No results found.   PMFS History: Patient Active Problem List   Diagnosis Date Noted  . Smoker 09/24/2017  . Primary osteoarthritis of both hands 09/24/2017  . Autoimmune disease ([place]) 09/24/2017  . History of osteoporosis 09/13/2017  . History of vertebral compression fracture 09/13/2017  . Raynaud's syndrome without gangrene 09/13/2017  . Pseudogout 09/13/2017  . History of migraine 09/13/2017  . Back pain 07/24/2013  . Compression fracture of L4 vertebra ([facility]) 07/24/2013  . Breast cancer of upper-outer quadrant of left female breast ([facility]) 10/18/2012   Past Medical History:  Diagnosis Date  . Arthritis    lt great toe ([facility])  . Breast cancer ([facility]) 11/15/12   left  . Cancer ([facility])    breast  . Compression fracture of L4 lumbar vertebra 11/04/2012   injury  . Contact lens/glasses fitting    wears contacts or glasses  . Gout 8-9 yrs ago   Rt heel/foot  . History of radiation  therapy 02/20/13-04/07/13   left breast 50.4 gray, upper outer quadrant boosted to 62.4 gray  . Osteoporosis   . Pseudogout of joint of left foot 6 yrs ago  . Raynaud's disease    bilateral hands and feet  . Wears partial dentures    partial lower    Family History  Problem Relation Age of Onset  . Hyperthyroidism Mother   . Cirrhosis Father   . Diabetes Brother   . Heart attack Maternal Grandmother   . Heart attack Maternal Grandfather     Past Surgical History:  Procedure Laterality Date  . BREAST LUMPECTOMY WITH NEEDLE LOCALIZATION AND AXILLARY SENTINEL LYMPH NODE BX Left 12/21/2012   Procedure:  BREAST LUMPECTOMY WITH NEEDLE LOCALIZATION AND AXILLARY SENTINEL LYMPH NODE BX;  Surgeon: Haywood Lasso, MD;  Location: [facility];  Service: General;  Laterality: Left;  needle local BCG    . BREAST SURGERY  age 14   left breast bx- benign  . CERVICAL CONIZATION W/BX  2012   benign  . ELBOW SURGERY    . KNEE ARTHROPLASTY    . KNEE ARTHROSCOPY  2006   right  . LATERAL EPICONDYLE RELEASE  03/08/2012   Procedure: TENNIS ELBOW RELEASE;  Surgeon: Cammie Sickle., MD;  Location: [facility];  Service: Orthopedics;  Laterality: Right;  RECONSTRUCTION OF RIGHT LATERAL ELBOW WITH TENDONS  . MASS EXCISION  03/08/2012   Procedure: EXCISION MASS;  Surgeon: Cammie Sickle., MD;  Location: [facility];  Service: Orthopedics;  Laterality: Right;  EXCISION OF RIGHT WRIST DORSAL CYST  . TENNIS ELBOW RELEASE/NIRSCHEL PROCEDURE Left 09/12/2015   Procedure: ARTHROTOMY LEFT ELBOW, REPAIR EXTENSOR ORIGIN;  Surgeon: Daryll Brod, MD;  Location: [facility];  Service: Orthopedics;  Laterality: Left;  . TONSILLECTOMY    . TONSILLECTOMY     57yrs old  . WRIST GANGLION EXCISION     left x2   Social History   Occupational History  . Not on file  Tobacco Use  . Smoking status: Current Every Day Smoker    Packs/day: 0.50    Years: 20.00    Pack years: 10.00    Types: Cigarettes  . Smokeless tobacco: Never Used  . Tobacco comment: 1ppd or less  Substance and Sexual Activity  . Alcohol use: Yes    Comment: rare 1 yearly  . Drug use: No  . Sexual activity: Not on file    Comment: menarche age 52, miscarriage x 1, P55,  age 33 w/birth of son, Depo-Provera x 20 yrs

## 2018-03-24 ENCOUNTER — Encounter: Payer: Self-pay | Admitting: Rheumatology

## 2018-03-24 ENCOUNTER — Ambulatory Visit (INDEPENDENT_AMBULATORY_CARE_PROVIDER_SITE_OTHER): Payer: Commercial Managed Care - PPO | Admitting: Rheumatology

## 2018-03-24 VITALS — BP 108/69 | HR 81 | Resp 14 | Ht 65.0 in | Wt 127.0 lb

## 2018-03-24 DIAGNOSIS — M5441 Lumbago with sciatica, right side: Secondary | ICD-10-CM

## 2018-03-24 DIAGNOSIS — M1120 Other chondrocalcinosis, unspecified site: Secondary | ICD-10-CM | POA: Diagnosis not present

## 2018-03-24 DIAGNOSIS — Z8781 Personal history of (healed) traumatic fracture: Secondary | ICD-10-CM

## 2018-03-24 DIAGNOSIS — I7300 Raynaud's syndrome without gangrene: Secondary | ICD-10-CM

## 2018-03-24 DIAGNOSIS — M19041 Primary osteoarthritis, right hand: Secondary | ICD-10-CM

## 2018-03-24 DIAGNOSIS — Z8669 Personal history of other diseases of the nervous system and sense organs: Secondary | ICD-10-CM

## 2018-03-24 DIAGNOSIS — C50412 Malignant neoplasm of upper-outer quadrant of left female breast: Secondary | ICD-10-CM

## 2018-03-24 DIAGNOSIS — M19042 Primary osteoarthritis, left hand: Secondary | ICD-10-CM

## 2018-03-24 DIAGNOSIS — Z8739 Personal history of other diseases of the musculoskeletal system and connective tissue: Secondary | ICD-10-CM

## 2018-03-24 DIAGNOSIS — F17200 Nicotine dependence, unspecified, uncomplicated: Secondary | ICD-10-CM

## 2018-03-24 DIAGNOSIS — M349 Systemic sclerosis, unspecified: Secondary | ICD-10-CM

## 2018-03-24 DIAGNOSIS — M79672 Pain in left foot: Secondary | ICD-10-CM

## 2018-03-25 LAB — COMPLETE METABOLIC PANEL WITH GFR
AG RATIO: 2.3 (calc) (ref 1.0–2.5)
ALT: 9 U/L (ref 6–29)
AST: 13 U/L (ref 10–35)
Albumin: 4.6 g/dL (ref 3.6–5.1)
Alkaline phosphatase (APISO): 69 U/L (ref 33–130)
BUN: 10 mg/dL (ref 7–25)
CALCIUM: 9.2 mg/dL (ref 8.6–10.4)
CO2: 33 mmol/L — AB (ref 20–32)
CREATININE: 0.85 mg/dL (ref 0.50–1.05)
Chloride: 104 mmol/L (ref 98–110)
GFR, EST AFRICAN AMERICAN: 87 mL/min/{1.73_m2} (ref >=60)
GFR, EST NON AFRICAN AMERICAN: 75 mL/min/{1.73_m2} (ref >=60)
GLOBULIN: 2 g/dL (ref 1.9–3.7)
Glucose, Bld: 85 mg/dL (ref 65–99)
POTASSIUM: 4.6 mmol/L (ref 3.5–5.3)
Sodium: 142 mmol/L (ref 135–146)
TOTAL PROTEIN: 6.6 g/dL (ref 6.1–8.1)
Total Bilirubin: 0.4 mg/dL (ref 0.2–1.2)

## 2018-03-25 LAB — CBC WITH DIFFERENTIAL/PLATELET
BASOS ABS: 42 {cells}/uL (ref 0–200)
Basophils Relative: 0.8 %
EOS PCT: 3.4 %
Eosinophils Absolute: 177 cells/uL (ref 15–500)
HEMATOCRIT: 38.8 % (ref 35.0–45.0)
Hemoglobin: 13.3 g/dL (ref 11.7–15.5)
LYMPHS ABS: 1487 {cells}/uL (ref 850–3900)
MCH: 30.5 pg (ref 27.0–33.0)
MCHC: 34.3 g/dL (ref 32.0–36.0)
MCV: 89 fL (ref 80.0–100.0)
MPV: 9.7 fL (ref 7.5–12.5)
Monocytes Relative: 6.7 %
NEUTROS PCT: 60.5 %
Neutro Abs: 3146 cells/uL (ref 1500–7800)
PLATELETS: 207 10*3/uL (ref 140–400)
RBC: 4.36 10*6/uL (ref 3.80–5.10)
RDW: 11.9 % (ref 11.0–15.0)
TOTAL LYMPHOCYTE: 28.6 %
WBC mixed population: 348 cells/uL (ref 200–950)
WBC: 5.2 10*3/uL (ref 3.8–10.8)

## 2018-03-25 LAB — URINALYSIS, ROUTINE W REFLEX MICROSCOPIC
Bilirubin Urine: NEGATIVE
GLUCOSE, UA: NEGATIVE
Hgb urine dipstick: NEGATIVE
Ketones, ur: NEGATIVE
Leukocytes, UA: NEGATIVE
NITRITE: NEGATIVE
PH: 6 (ref 5.0–8.0)
Protein, ur: NEGATIVE
SPECIFIC GRAVITY, URINE: 1.006 (ref 1.001–1.03)

## 2018-03-25 NOTE — Progress Notes (Signed)
UA normal. CBC and CMP WNL.

## 2018-05-23 ENCOUNTER — Ambulatory Visit (INDEPENDENT_AMBULATORY_CARE_PROVIDER_SITE_OTHER): Payer: Commercial Managed Care - PPO | Admitting: Otolaryngology

## 2018-05-23 DIAGNOSIS — H9313 Tinnitus, bilateral: Secondary | ICD-10-CM | POA: Diagnosis not present

## 2018-05-23 DIAGNOSIS — H903 Sensorineural hearing loss, bilateral: Secondary | ICD-10-CM | POA: Diagnosis not present

## 2018-05-30 DIAGNOSIS — Z6820 Body mass index (BMI) 20.0-20.9, adult: Secondary | ICD-10-CM | POA: Diagnosis not present

## 2018-05-30 DIAGNOSIS — M62271 Nontraumatic ischemic infarction of muscle, right ankle and foot: Secondary | ICD-10-CM | POA: Diagnosis not present

## 2018-06-03 DIAGNOSIS — F17210 Nicotine dependence, cigarettes, uncomplicated: Secondary | ICD-10-CM | POA: Diagnosis not present

## 2018-06-03 DIAGNOSIS — I7300 Raynaud's syndrome without gangrene: Secondary | ICD-10-CM | POA: Diagnosis not present

## 2018-06-15 DIAGNOSIS — Z6820 Body mass index (BMI) 20.0-20.9, adult: Secondary | ICD-10-CM | POA: Diagnosis not present

## 2018-06-15 DIAGNOSIS — M62271 Nontraumatic ischemic infarction of muscle, right ankle and foot: Secondary | ICD-10-CM | POA: Diagnosis not present

## 2018-06-23 DIAGNOSIS — Z8679 Personal history of other diseases of the circulatory system: Secondary | ICD-10-CM | POA: Diagnosis not present

## 2018-07-04 ENCOUNTER — Encounter: Payer: Self-pay | Admitting: Vascular Surgery

## 2018-07-04 ENCOUNTER — Ambulatory Visit (INDEPENDENT_AMBULATORY_CARE_PROVIDER_SITE_OTHER): Payer: Commercial Managed Care - PPO | Admitting: Vascular Surgery

## 2018-07-04 VITALS — BP 135/74 | HR 74 | Temp 98.2°F | Resp 18 | Wt 123.0 lb

## 2018-07-04 DIAGNOSIS — I7300 Raynaud's syndrome without gangrene: Secondary | ICD-10-CM | POA: Diagnosis not present

## 2018-07-04 NOTE — Progress Notes (Signed)
Vascular and Vein Specialist of [facility] office  Patient name: Doris Bender MRN: [id number] DOB: Feb 22, 1961 Sex: female  REASON FOR CONSULT: Evaluation of shin on the plantar aspect right great toe and Raynauds disease  HPI: Doris Bender is a 58 y.o. female, who is her today for evaluation of discomfort in her right foot.  She has a long history of Raynauds these.  She keeps hand warmers and foot warmers with her and has learned to avoid the cold.  She also reports that the motion causes color changes and coldness and pain in her feet and hands.  She has not been on any medication specifically for this.  Also has a history of scleroderma and rheumatoid arthritis.  She reports a month ago having an episode of discoloration that began suddenly on the plantar aspect of her first metatarsal head on her right great toe.  She actually took a picture of this for review.  It appears as though she burst a blood vessel under the surface of the skin.  This eventually evolved into a small fissure which has healed with some callus formation.  She continues to have purplish discoloration of her great toe and this area on her foot although she does have some discoloration of all the toes of her feet.  Also her hands.  Undergo noninvasive studies at [facility] him on 06/23/2018 and I have these for review as well.  She is a cigarette smoker.  No other medical difficulties  Past Medical History:  Diagnosis Date  . Arthritis    lt great toe (RA)  . Breast cancer ([place]) 11/15/12   left  . Cancer ([place])    breast  . Compression fracture of L4 lumbar vertebra 11/04/2012   injury  . Contact lens/glasses fitting    wears contacts or glasses  . Gout 8-9 yrs ago   Rt heel/foot  . History of radiation therapy 02/20/13-04/07/13   left breast 50.4 gray, upper outer quadrant boosted to 62.4 gray  . Osteoporosis   . Pseudogout of joint of left foot 6 yrs ago  .  Raynaud's disease    bilateral hands and feet  . Wears partial dentures    partial lower    Family History  Problem Relation Age of Onset  . Hyperthyroidism Mother   . Cirrhosis Father   . Diabetes Brother   . Heart attack Maternal Grandmother   . Heart attack Maternal Grandfather     SOCIAL HISTORY: Social History   Socioeconomic History  . Marital status: Widowed    Spouse name: Not on file  . Number of children: Not on file  . Years of education: Not on file  . Highest education level: Not on file  Occupational History  . Not on file  Social Needs  . Financial resource strain: Not on file  . Food insecurity:    Worry: Not on file    Inability: Not on file  . Transportation needs:    Medical: Not on file    Non-medical: Not on file  Tobacco Use  . Smoking status: Current Every Day Smoker    Packs/day: 0.50    Years: 20.00    Pack years: 10.00    Types: Cigarettes  . Smokeless tobacco: Never Used  . Tobacco comment: 1ppd or less  Substance and Sexual Activity  . Alcohol use: Yes    Comment: rare 1 yearly  . Drug use: No  . Sexual activity: Not  on file    Comment: menarche age 34, miscarriage x 1, P55,  age 23 w/birth of son, Depo-Provera x 20 yrs  Lifestyle  . Physical activity:    Days per week: Not on file    Minutes per session: Not on file  . Stress: Not on file  Relationships  . Social connections:    Talks on phone: Not on file    Gets together: Not on file    Attends religious service: Not on file    Active member of club or organization: Not on file    Attends meetings of clubs or organizations: Not on file    Relationship status: Not on file  . Intimate partner violence:    Fear of current or ex partner: Not on file    Emotionally abused: Not on file    Physically abused: Not on file    Forced sexual activity: Not on file  Other Topics Concern  . Not on file  Social History Narrative   ** Merged History Encounter **        Allergies    Allergen Reactions  . Hydroxychloroquine Nausea And Vomiting and Other (See Comments)    Back, stomach/ abd pain.  . Prednisone Nausea And Vomiting    Other reaction(s): Abdominal Pain  . Doxycycline     dizzy  . Ibuprofen Hives  . Keflex [Cephalexin] Hives  . Ketorolac Hives  . Lodine [Etodolac] Hives  . Methocarbamol Nausea Only  . Oxycodone Nausea And Vomiting  . Oxycodone Nausea Only  . Robaxin [Methocarbamol] Hives    Current Outpatient Medications  Medication Sig Dispense Refill  . ALPRAZolam (XANAX) 0.25 MG tablet Take 0.25 mg by mouth at bedtime.   0   No current facility-administered medications for this visit.     REVIEW OF SYSTEMS:  [X]  denotes positive finding, [ ]  denotes negative finding Cardiac  Comments:  Chest pain or chest pressure:    Shortness of breath upon exertion:    Short of breath when lying flat:    Irregular heart rhythm:        Vascular    Pain in calf, thigh, or hip brought on by ambulation:    Pain in feet at night that wakes you up from your sleep:     Blood clot in your veins:    Leg swelling:         Pulmonary    Oxygen at home:    Productive cough:     Wheezing:         Neurologic    Sudden weakness in arms or legs:     Sudden numbness in arms or legs:     Sudden onset of difficulty speaking or slurred speech:    Temporary loss of vision in one eye:     Problems with dizziness:         Gastrointestinal    Blood in stool:     Vomited blood:         Genitourinary    Burning when urinating:     Blood in urine:        Psychiatric    Major depression:         Hematologic    Bleeding problems:    Problems with blood clotting too easily:        Skin    Rashes or ulcers:        Constitutional    Fever or chills:      PHYSICAL  EXAM: Vitals:   07/04/18 1459  BP: 135/74  Pulse: 74  Resp: 18  Temp: 98.2 F (36.8 C)  TempSrc: Temporal  Weight: 123 lb (55.8 kg)    GENERAL: The patient is a well-nourished  female, in no acute distress. The vital signs are documented above. CARDIOVASCULAR: Palpable radial pulses.  Palpable femoral and dorsalis pedis pulses bilaterally PULMONARY: There is good air exchange  ABDOMEN: Soft and non-tender  MUSCULOSKELETAL: There are no major deformities or cyanosis. NEUROLOGIC: No focal weakness or paresthesias are detected. SKIN: There are no ulcers or rashes noted.  Ears are quite red on her hands currently.  These can alternate between different discoloration.  Does have some cyanotic purplish changes of her toes most particularly her right great toe.  Callus formation on the base of her right great toe PSYCHIATRIC: The patient has a normal affect.  DATA:  Noninvasive studies from 06/23/2018 reveal ankle arm index bilaterally with PPG of her toes suggesting small vessel disease  MEDICAL ISSUES: Had long discussion with the patient regarding this.  I am unclear to see if the falls of the area on the base of her right great toe but this did appear to be a burst small blood vessel.  She is having ongoing resolution of this.  I explained that this may cause her some discomfort for some period of time.  I explained there would be no surgical treatment for this.  She has been suggested to begin aspirin and I feel that this would be appropriate.  She is asking about anti-cholesterol type medication and reports that she is labs have shown no evidence of elevated levels of cholesterol.  I feel that this would be less important.  Also explained the potential for dental blockers should she have worsening symptoms of her vasospasm.  She was reassured with this discussion and will see [place] again on an as-needed basis   Rosetta Posner, MD [facility] Vascular and Vein Specialists of [facility] Tel [phone] Pager [phone]

## 2018-07-05 DIAGNOSIS — K859 Acute pancreatitis without necrosis or infection, unspecified: Secondary | ICD-10-CM | POA: Diagnosis not present

## 2018-07-05 DIAGNOSIS — Q453 Other congenital malformations of pancreas and pancreatic duct: Secondary | ICD-10-CM | POA: Diagnosis not present

## 2018-07-12 DIAGNOSIS — K869 Disease of pancreas, unspecified: Secondary | ICD-10-CM | POA: Diagnosis not present

## 2018-07-12 DIAGNOSIS — K8020 Calculus of gallbladder without cholecystitis without obstruction: Secondary | ICD-10-CM | POA: Diagnosis not present

## 2018-08-23 ENCOUNTER — Ambulatory Visit: Payer: Commercial Managed Care - PPO | Admitting: Rheumatology

## 2018-08-29 ENCOUNTER — Ambulatory Visit (INDEPENDENT_AMBULATORY_CARE_PROVIDER_SITE_OTHER): Payer: Commercial Managed Care - PPO | Admitting: Orthopedic Surgery

## 2018-08-29 ENCOUNTER — Encounter (INDEPENDENT_AMBULATORY_CARE_PROVIDER_SITE_OTHER): Payer: Self-pay | Admitting: Orthopedic Surgery

## 2018-08-29 ENCOUNTER — Ambulatory Visit (INDEPENDENT_AMBULATORY_CARE_PROVIDER_SITE_OTHER): Payer: Self-pay

## 2018-08-29 ENCOUNTER — Other Ambulatory Visit: Payer: Self-pay

## 2018-08-29 DIAGNOSIS — M25561 Pain in right knee: Secondary | ICD-10-CM

## 2018-08-29 MED ORDER — METHYLPREDNISOLONE ACETATE 40 MG/ML IJ SUSP
40.0000 mg | INTRAMUSCULAR | Status: AC | PRN
  Administered 2018-08-29: 40 mg via INTRA_ARTICULAR

## 2018-08-29 MED ORDER — BUPIVACAINE HCL 0.25 % IJ SOLN
4.0000 mL | INTRAMUSCULAR | Status: AC | PRN
  Administered 2018-08-29: 4 mL via INTRA_ARTICULAR

## 2018-08-29 MED ORDER — LIDOCAINE HCL 1 % IJ SOLN
5.0000 mL | INTRAMUSCULAR | Status: AC | PRN
  Administered 2018-08-29: 5 mL

## 2018-08-29 NOTE — Progress Notes (Signed)
Office Visit Note   Patient: Doris Bender           Date of Birth: 1960/07/22           MRN: [id number] Visit Date: 08/29/2018 Requested by: Rory Percy, MD [place], [place] [address] PCP: Rory Percy, MD  Subjective: Chief Complaint  Patient presents with  . Right Knee - Pain    HPI: Marcie Bal is a 58 year old female with known history of right knee issues.  She had arthroscopic surgery 15 years ago on that right-hand side.  Several days ago a dog ran into her right knee giving her a valgus type injury.  She is reported pain but no instability since that time.  She is taken some over-the-counter medication without much relief.  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              ROS: All systems reviewed are negative as they relate to the chief complaint within the history of present illness.  Patient denies  fevers or chills.   Assessment & Plan: Visit Diagnoses:  1. Right knee pain, unspecified chronicity     Plan: Impression is right knee pain with some medial compartment arthritis on radiographs but no significant effusion no significant ligament instability and fairly global pain.  Plan at this time is injection into that right knee.  That is helped her before and the last one was done about a year ago.  If she has recurrent symptoms then we could consider further imaging but will try the injection for now and I will see her back as needed  Follow-Up Instructions: No follow-ups on file.   Orders:  Orders Placed This Encounter  Procedures  . XR Knee 1-2 Views Right   No orders of the defined types were placed in this encounter.     Procedures: Large Joint Inj: R knee on 08/29/2018 10:24 AM Indications: diagnostic evaluation, joint swelling and pain Details: 18 G 1.5 in needle, superolateral approach  Arthrogram: No  Medications: 5 mL lidocaine 1 %; 40 mg methylPREDNISolone acetate 40 MG/ML; 4 mL bupivacaine  0.25 % Outcome: tolerated well, no immediate complications Procedure, treatment alternatives, risks and benefits explained, specific risks discussed. Consent was given by the patient. Immediately prior to procedure a time out was called to verify the correct patient, procedure, equipment, support staff and site/side marked as required. Patient was prepped and draped in the usual sterile fashion.       Clinical Data: No additional findings.  Objective: Vital Signs: There were no vitals taken for this visit.  Physical Exam:   Constitutional: Patient appears well-developed HEENT:  Head: Normocephalic Eyes:EOM are normal Neck: Normal range of motion Cardiovascular: Normal rate Pulmonary/chest: Effort normal Neurologic: Patient is alert Skin: Skin is warm Psychiatric: Patient has normal mood and affect    Ortho Exam: Ortho exam demonstrates antalgic gait to the right.  She does have medial and lateral joint line tenderness.  Extensor mechanism is intact.  Pedal pulses palpable.  No masses lymphadenopathy or skin changes noted in that right knee region.  Extensor mechanism is intact.  Collateral and cruciate ligaments feel relatively stable.  Specialty Comments:  No specialty comments available.  Imaging: Xr Knee 1-2 Views Right  Result Date: 08/29/2018 AP lateral right knee reviewed.  Mild varus alignment is present.  There is significant joint space narrowing on the medial aspect of the medial compartment.  Some  spurring is noted in this area.  No other fracture or dislocation is present in the right knee area.    PMFS History: Patient Active Problem List   Diagnosis Date Noted  . Smoker 09/24/2017  . Primary osteoarthritis of both hands 09/24/2017  . Autoimmune disease ([place]) 09/24/2017  . History of osteoporosis 09/13/2017  . History of vertebral compression fracture 09/13/2017  . Raynaud's syndrome without gangrene 09/13/2017  . Pseudogout 09/13/2017  . History of  migraine 09/13/2017  . Back pain 07/24/2013  . Compression fracture of L4 vertebra ([facility]) 07/24/2013  . Breast cancer of upper-outer quadrant of left female breast ([place]) 10/18/2012   Past Medical History:  Diagnosis Date  . Arthritis    lt great toe (RA)  . Breast cancer ([place]) 11/15/12   left  . Cancer ([place])    breast  . Compression fracture of L4 lumbar vertebra 11/04/2012   injury  . Contact lens/glasses fitting    wears contacts or glasses  . Gout 8-9 yrs ago   Rt heel/foot  . History of radiation therapy 02/20/13-04/07/13   left breast 50.4 gray, upper outer quadrant boosted to 62.4 gray  . Osteoporosis   . Pseudogout of joint of left foot 6 yrs ago  . Raynaud's disease    bilateral hands and feet  . Wears partial dentures    partial lower    Family History  Problem Relation Age of Onset  . Hyperthyroidism Mother   . Cirrhosis Father   . Diabetes Brother   . Heart attack Maternal Grandmother   . Heart attack Maternal Grandfather     Past Surgical History:  Procedure Laterality Date  . BREAST LUMPECTOMY WITH NEEDLE LOCALIZATION AND AXILLARY SENTINEL LYMPH NODE BX Left 12/21/2012   Procedure: BREAST LUMPECTOMY WITH NEEDLE LOCALIZATION AND AXILLARY SENTINEL LYMPH NODE BX;  Surgeon: Haywood Lasso, MD;  Location: [facility];  Service: General;  Laterality: Left;  needle local BCG    . BREAST SURGERY  age 55   left breast bx- benign  . CERVICAL CONIZATION W/BX  2012   benign  . ELBOW SURGERY    . KNEE ARTHROPLASTY    . KNEE ARTHROSCOPY  2006   right  . LATERAL EPICONDYLE RELEASE  03/08/2012   Procedure: TENNIS ELBOW RELEASE;  Surgeon: Cammie Sickle., MD;  Location: [place];  Service: Orthopedics;  Laterality: Right;  RECONSTRUCTION OF RIGHT LATERAL ELBOW WITH TENDONS  . MASS EXCISION  03/08/2012   Procedure: EXCISION MASS;  Surgeon: Cammie Sickle., MD;  Location: [place];  Service: Orthopedics;  Laterality:  Right;  EXCISION OF RIGHT WRIST DORSAL CYST  . TENNIS ELBOW RELEASE/NIRSCHEL PROCEDURE Left 09/12/2015   Procedure: ARTHROTOMY LEFT ELBOW, REPAIR EXTENSOR ORIGIN;  Surgeon: Daryll Brod, MD;  Location: [facility];  Service: Orthopedics;  Laterality: Left;  . TONSILLECTOMY    . TONSILLECTOMY     58yrs old  . WRIST GANGLION EXCISION     left x2   Social History   Occupational History  . Not on file  Tobacco Use  . Smoking status: Current Every Day Smoker    Packs/day: 0.50    Years: 20.00    Pack years: 10.00    Types: Cigarettes  . Smokeless tobacco: Never Used  . Tobacco comment: 1ppd or less  Substance and Sexual Activity  . Alcohol use: Yes    Comment: rare 1 yearly  . Drug use: No  .  Sexual activity: Not on file    Comment: menarche age 18, miscarriage x 1, P74,  age 27 w/birth of son, Depo-Provera x 42 yrs

## 2018-10-12 NOTE — Progress Notes (Signed)
Office Visit Note  Patient: Doris Bender             Date of Birth: 1961/01/09           MRN: [id number]             PCP: Rory Percy, MD Referring: Rory Percy, MD Visit Date: 10/26/2018 Occupation: @GUAROCC @  Subjective:  Pain in joints.   History of Present Illness: Doris Bender is a 58 y.o. female with history of a scleroderma, osteoarthritis and osteoporosis.  She states she continues to have some problems with Raynaud's phenomenon.  She has been also experiencing increased pain in her both hands and both feet.  She notices some swelling in her hands.  Activities of Daily Living:  Patient reports morning stiffness for 30-60 minutes.   Patient Reports nocturnal pain.  Difficulty dressing/grooming: Denies Difficulty climbing stairs: Denies Difficulty getting out of chair: Denies Difficulty using hands for taps, buttons, cutlery, and/or writing: Reports  Review of Systems  Constitutional: Negative for fatigue, night sweats, weight gain and weight loss.  HENT: Positive for mouth dryness. Negative for mouth sores, trouble swallowing, trouble swallowing and nose dryness.   Eyes: Negative for pain, redness, itching, visual disturbance and dryness.  Respiratory: Negative for cough, shortness of breath, wheezing and difficulty breathing.   Cardiovascular: Negative for chest pain, palpitations, hypertension, irregular heartbeat and swelling in legs/feet.  Gastrointestinal: Negative for abdominal pain, blood in stool, constipation and diarrhea.  Endocrine: Negative for increased urination.  Genitourinary: Negative for painful urination, pelvic pain and vaginal dryness.  Musculoskeletal: Positive for arthralgias, joint pain, joint swelling and morning stiffness. Negative for myalgias, muscle weakness, muscle tenderness and myalgias.  Skin: Positive for color change and skin tightness. Negative for rash, hair loss, ulcers and sensitivity to sunlight.  Allergic/Immunologic:  Negative for susceptible to infections.  Neurological: Negative for dizziness, light-headedness, headaches, memory loss, night sweats and weakness.  Hematological: Negative for swollen glands.  Psychiatric/Behavioral: Negative for depressed mood, confusion and sleep disturbance. The patient is not nervous/anxious.     PMFS History:  Patient Active Problem List   Diagnosis Date Noted  . Smoker 09/24/2017  . Primary osteoarthritis of both hands 09/24/2017  . Autoimmune disease ([place]) 09/24/2017  . History of osteoporosis 09/13/2017  . History of vertebral compression fracture 09/13/2017  . Raynaud's syndrome without gangrene 09/13/2017  . Pseudogout 09/13/2017  . History of migraine 09/13/2017  . Back pain 07/24/2013  . Compression fracture of L4 vertebra ([facility]) 07/24/2013  . Breast cancer of upper-outer quadrant of left female breast ([place]) 10/18/2012    Past Medical History:  Diagnosis Date  . Arthritis    lt great toe (RA)  . Breast cancer ([place]) 11/15/12   left  . Cancer ([place])    breast  . Compression fracture of L4 lumbar vertebra 11/04/2012   injury  . Contact lens/glasses fitting    wears contacts or glasses  . Gout 8-9 yrs ago   Rt heel/foot  . History of radiation therapy 02/20/13-04/07/13   left breast 50.4 gray, upper outer quadrant boosted to 62.4 gray  . Osteoporosis   . Pseudogout of joint of left foot 6 yrs ago  . Raynaud's disease    bilateral hands and feet  . Wears partial dentures    partial lower    Family History  Problem Relation Age of Onset  . Hyperthyroidism Mother   . Cirrhosis Father   . Diabetes Brother   . Heart attack Maternal  Grandmother   . Heart attack Maternal Grandfather    Past Surgical History:  Procedure Laterality Date  . BREAST LUMPECTOMY WITH NEEDLE LOCALIZATION AND AXILLARY SENTINEL LYMPH NODE BX Left 12/21/2012   Procedure: BREAST LUMPECTOMY WITH NEEDLE LOCALIZATION AND AXILLARY SENTINEL LYMPH NODE BX;  Surgeon: Haywood Lasso, MD;  Location: [facility];  Service: General;  Laterality: Left;  needle local BCG    . BREAST SURGERY  age 9   left breast bx- benign  . CERVICAL CONIZATION W/BX  2012   benign  . ELBOW SURGERY    . KNEE ARTHROPLASTY    . KNEE ARTHROSCOPY  2006   right  . LATERAL EPICONDYLE RELEASE  03/08/2012   Procedure: TENNIS ELBOW RELEASE;  Surgeon: Cammie Sickle., MD;  Location: [facility];  Service: Orthopedics;  Laterality: Right;  RECONSTRUCTION OF RIGHT LATERAL ELBOW WITH TENDONS  . MASS EXCISION  03/08/2012   Procedure: EXCISION MASS;  Surgeon: Cammie Sickle., MD;  Location: [facility];  Service: Orthopedics;  Laterality: Right;  EXCISION OF RIGHT WRIST DORSAL CYST  . TENNIS ELBOW RELEASE/NIRSCHEL PROCEDURE Left 09/12/2015   Procedure: ARTHROTOMY LEFT ELBOW, REPAIR EXTENSOR ORIGIN;  Surgeon: Daryll Brod, MD;  Location: [place];  Service: Orthopedics;  Laterality: Left;  . TONSILLECTOMY    . TONSILLECTOMY     58yrs old  . WRIST GANGLION EXCISION     left x2   Social History   Social History Narrative   ** Merged History Encounter **        There is no immunization history on file for this patient.   Objective: Vital Signs: BP 101/69 (BP Location: Right Arm, Patient Position: Sitting, Cuff Size: Normal)   Pulse 98   Resp 14   Ht 5\' 5"  (1.651 m)   Wt 132 lb 12.8 oz (60.2 kg)   BMI 22.10 kg/m    Physical Exam Vitals signs and nursing note reviewed.  Constitutional:      Appearance: She is well-developed.  HENT:     Head: Normocephalic and atraumatic.  Eyes:     Conjunctiva/sclera: Conjunctivae normal.  Neck:     Musculoskeletal: Normal range of motion.  Cardiovascular:     Rate and Rhythm: Normal rate and regular rhythm.     Heart sounds: Normal heart sounds.  Pulmonary:     Effort: Pulmonary effort is normal.     Breath sounds: Normal breath sounds.  Abdominal:     General: Bowel sounds are  normal.     Palpations: Abdomen is soft.  Lymphadenopathy:     Cervical: No cervical adenopathy.  Skin:    General: Skin is warm and dry.     Capillary Refill: Capillary refill takes 2 to 3 seconds.     Comments: Sclerodactyly noted over bilateral hands.  Telangiectasias were noted.  Periungual erythema was noted.  Neurological:     Mental Status: She is alert and oriented to person, place, and time.  Psychiatric:        Behavior: Behavior normal.      Musculoskeletal Exam: C-spine thoracic and lumbar spine good range of motion.  Shoulder joints elbow joints wrist joints with good range of motion.  She is in complete fist formation.  Tenderness was noted over PIPs bilaterally.  No synovitis was noted.  Hip joints and knee joints with good range of motion.  No warmth swelling or effusion was noted in the knees.  She  has some swelling over base of her left second and third MTP joints.  CDAI Exam: CDAI Score: - Patient Global: -; Provider Global: - Swollen: 0 ; Tender: 0  Joint Exam   No joint exam has been documented for this visit     Investigation: No additional findings.  Imaging: No results found.  Recent Labs: Lab Results  Component Value Date   WBC 5.2 03/24/2018   HGB 13.3 03/24/2018   PLT 207 03/24/2018   NA 142 03/24/2018   K 4.6 03/24/2018   CL 104 03/24/2018   CO2 33 (H) 03/24/2018   GLUCOSE 85 03/24/2018   BUN 10 03/24/2018   CREATININE 0.85 03/24/2018   BILITOT 0.4 03/24/2018   ALKPHOS 83 01/05/2016   AST 13 03/24/2018   ALT 9 03/24/2018   PROT 6.6 03/24/2018   ALBUMIN 4.3 01/05/2016   CALCIUM 9.2 03/24/2018   GFRAA 87 03/24/2018    Speciality Comments: No specialty comments available.  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  Her Raynaud's persist.  She has been experiencing increased joint pain and discomfort.  She had decreased fist formation bilaterally.  She also had some swelling on her left foot.  I detailed discussion regarding different treatment options including Plaquenil and methotrexate which she declined.  I have given her a list of natural anti-inflammatories.  Plan: CBC with Differential/Platelet, COMPLETE METABOLIC PANEL WITH GFR  Raynaud's disease without gangrene - Plan: Keeping warmer core temperature discussed.  Pain in both hands -she has been having swelling in her hands.  She had some PIP tenderness on palpation today.  She had +14 [phone] in the past.  I will repeat her labs today.  Plan: Sedimentation rate, Cyclic citrul peptide antibody, IgG, 14-3-3 eta Protein,   Primary osteoarthritis of both hands -patient has DIP and PIP thickening but no synovitis on examination.  Joint protection was discussed.   Pain in both feet-she had tenderness over her left second and third MTP joint with some swelling.  Patient declined any DMARDs.  Natural anti-inflammatories were discussed.  Pseudogout - Diagnosed by Dr. Jerene Pitch in the past.  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  Smoker -smoking cessation discussed.  Malignant neoplasm of upper-outer quadrant of left female breast, unspecified estrogen receptor status ([place])   History of osteoporosis -she is on calcium and vitamin D.  She does not want to take any medications.  History of migraine  History of vertebral compression fracture      Orders: Orders Placed This Encounter  Procedures  . CBC with Differential/Platelet  . COMPLETE METABOLIC PANEL WITH GFR  . Sedimentation rate  . Cyclic citrul peptide antibody, IgG  .  14-3-3 eta Protein   No orders of the defined types were placed in this encounter.     Follow-Up Instructions: Return for Scleroderma, Osteoarthritis, CPPD.   Bo Merino, MD  Note - This record has been created using Editor, commissioning.  Chart creation errors have been sought, but may not always  have been located. Such creation errors do not reflect on  the standard of medical care.

## 2018-10-26 ENCOUNTER — Other Ambulatory Visit: Payer: Self-pay

## 2018-10-26 ENCOUNTER — Encounter: Payer: Self-pay | Admitting: Rheumatology

## 2018-10-26 ENCOUNTER — Encounter: Payer: Self-pay | Admitting: *Deleted

## 2018-10-26 ENCOUNTER — Ambulatory Visit (INDEPENDENT_AMBULATORY_CARE_PROVIDER_SITE_OTHER): Payer: Commercial Managed Care - PPO | Admitting: Rheumatology

## 2018-10-26 VITALS — BP 101/69 | HR 98 | Resp 14 | Ht 65.0 in | Wt 132.8 lb

## 2018-10-26 DIAGNOSIS — M349 Systemic sclerosis, unspecified: Secondary | ICD-10-CM

## 2018-10-26 DIAGNOSIS — F17200 Nicotine dependence, unspecified, uncomplicated: Secondary | ICD-10-CM

## 2018-10-26 DIAGNOSIS — Z8739 Personal history of other diseases of the musculoskeletal system and connective tissue: Secondary | ICD-10-CM

## 2018-10-26 DIAGNOSIS — Z8781 Personal history of (healed) traumatic fracture: Secondary | ICD-10-CM

## 2018-10-26 DIAGNOSIS — I7300 Raynaud's syndrome without gangrene: Secondary | ICD-10-CM

## 2018-10-26 DIAGNOSIS — C50412 Malignant neoplasm of upper-outer quadrant of left female breast: Secondary | ICD-10-CM

## 2018-10-26 DIAGNOSIS — M19042 Primary osteoarthritis, left hand: Secondary | ICD-10-CM

## 2018-10-26 DIAGNOSIS — M79642 Pain in left hand: Secondary | ICD-10-CM

## 2018-10-26 DIAGNOSIS — M79641 Pain in right hand: Secondary | ICD-10-CM

## 2018-10-26 DIAGNOSIS — M19041 Primary osteoarthritis, right hand: Secondary | ICD-10-CM

## 2018-10-26 DIAGNOSIS — M1120 Other chondrocalcinosis, unspecified site: Secondary | ICD-10-CM | POA: Diagnosis not present

## 2018-10-26 DIAGNOSIS — Z8669 Personal history of other diseases of the nervous system and sense organs: Secondary | ICD-10-CM

## 2018-10-26 NOTE — Progress Notes (Signed)
Pharmacy Note  Subjective:  Patient presents today to the [facility] to see Dr. Estanislado Pandy.   Patient seen by the pharmacist for counseling on natural anti-inflammatories.  Objective: Current Outpatient Medications on File Prior to Visit  Medication Sig Dispense Refill  . Acetaminophen (TYLENOL ARTHRITIS PAIN PO) Take by mouth as needed.    . ALPRAZolam (XANAX) 0.25 MG tablet Take 0.25 mg by mouth at bedtime.   0   No current facility-administered medications on file prior to visit.     Assessment/Plan:  Counseled on the purpose, proper use, and adverse effects of natural anti-inflammatories including upset stomach and increased bleeding risk.  Encouraged patient to add one medication at a time and to include on medication list to monitor for adverse effects and drug interactions.  Given educational handout with recommended doses.  All questions encouraged and answered.  Instructed patient to call with any further questions or concerns.  Mariella Saa, PharmD, [facility] Clinical Pharmacist  10/26/2018 12:56 PM

## 2018-11-02 LAB — COMPLETE METABOLIC PANEL WITH GFR
AG Ratio: 2.4 (calc) (ref 1.0–2.5)
ALT: 10 U/L (ref 6–29)
AST: 13 U/L (ref 10–35)
Albumin: 4.5 g/dL (ref 3.6–5.1)
Alkaline phosphatase (APISO): 84 U/L (ref 37–153)
BUN: 8 mg/dL (ref 7–25)
CO2: 35 mmol/L — ABNORMAL HIGH (ref 20–32)
Calcium: 9.2 mg/dL (ref 8.6–10.4)
Chloride: 103 mmol/L (ref 98–110)
Creat: 0.88 mg/dL (ref 0.50–1.05)
GFR, Est African American: 83 mL/min/{1.73_m2} (ref >=60)
GFR, Est Non African American: 72 mL/min/{1.73_m2} (ref >=60)
Globulin: 1.9 g/dL (calc) (ref 1.9–3.7)
Glucose, Bld: 87 mg/dL (ref 65–99)
Potassium: 4.2 mmol/L (ref 3.5–5.3)
Sodium: 142 mmol/L (ref 135–146)
Total Bilirubin: 0.5 mg/dL (ref 0.2–1.2)
Total Protein: 6.4 g/dL (ref 6.1–8.1)

## 2018-11-02 LAB — CBC WITH DIFFERENTIAL/PLATELET
Absolute Monocytes: 338 cells/uL (ref 200–950)
Basophils Absolute: 41 cells/uL (ref 0–200)
Basophils Relative: 0.9 %
Eosinophils Absolute: 239 cells/uL (ref 15–500)
Eosinophils Relative: 5.3 %
HCT: 40.4 % (ref 35.0–45.0)
Hemoglobin: 13.8 g/dL (ref 11.7–15.5)
Lymphs Abs: 927 cells/uL (ref 850–3900)
MCH: 30.8 pg (ref 27.0–33.0)
MCHC: 34.2 g/dL (ref 32.0–36.0)
MCV: 90.2 fL (ref 80.0–100.0)
MPV: 9.4 fL (ref 7.5–12.5)
Monocytes Relative: 7.5 %
Neutro Abs: 2957 cells/uL (ref 1500–7800)
Neutrophils Relative %: 65.7 %
Platelets: 203 10*3/uL (ref 140–400)
RBC: 4.48 10*6/uL (ref 3.80–5.10)
RDW: 12.3 % (ref 11.0–15.0)
Total Lymphocyte: 20.6 %
WBC: 4.5 10*3/uL (ref 3.8–10.8)

## 2018-11-02 LAB — SEDIMENTATION RATE: Sed Rate: 9 mm/h (ref 0–30)

## 2018-11-02 LAB — 14-3-3 ETA PROTEIN: 14-3-3 eta Protein: 0.3 ng/mL — ABNORMAL HIGH (ref <0.2)

## 2018-11-02 LAB — CYCLIC CITRUL PEPTIDE ANTIBODY, IGG: Cyclic Citrullin Peptide Ab: <16 UNITS

## 2018-11-02 NOTE — Progress Notes (Signed)
14-3-3 n low titer positive.Although the titers are unchanged. Which can be associated with RA. WE discussed PLQ at the last visit which pt declined.  She may contact us in case she develops increased swelling.

## 2018-11-11 ENCOUNTER — Other Ambulatory Visit: Payer: Self-pay | Admitting: *Deleted

## 2018-11-11 DIAGNOSIS — F17210 Nicotine dependence, cigarettes, uncomplicated: Secondary | ICD-10-CM

## 2018-11-11 DIAGNOSIS — Z122 Encounter for screening for malignant neoplasm of respiratory organs: Secondary | ICD-10-CM

## 2018-12-12 ENCOUNTER — Encounter: Payer: Self-pay | Admitting: Acute Care

## 2018-12-12 ENCOUNTER — Ambulatory Visit (INDEPENDENT_AMBULATORY_CARE_PROVIDER_SITE_OTHER)
Admission: RE | Admit: 2018-12-12 | Discharge: 2018-12-12 | Disposition: A | Discharge status: Home / Self Care | Payer: Commercial Managed Care - PPO | Source: Ambulatory Visit | Attending: Acute Care | Admitting: Acute Care

## 2018-12-12 ENCOUNTER — Other Ambulatory Visit: Payer: Self-pay

## 2018-12-12 ENCOUNTER — Ambulatory Visit (INDEPENDENT_AMBULATORY_CARE_PROVIDER_SITE_OTHER): Payer: Commercial Managed Care - PPO | Admitting: Acute Care

## 2018-12-12 VITALS — BP 100/58 | HR 78 | Temp 98.3°F | Ht 65.0 in | Wt 131.4 lb

## 2018-12-12 DIAGNOSIS — Z122 Encounter for screening for malignant neoplasm of respiratory organs: Secondary | ICD-10-CM

## 2018-12-12 DIAGNOSIS — F17210 Nicotine dependence, cigarettes, uncomplicated: Secondary | ICD-10-CM

## 2018-12-12 NOTE — Progress Notes (Signed)
Shared Decision Making Visit Lung Cancer Screening Program [id number])   Eligibility:  Age 58 y.o.  Pack Years Smoking History Calculation 37 pack year smoking history (# packs/per year x # years smoked)  Recent History of coughing up blood  no  Unexplained weight loss? no ( >Than 15 pounds within the last 6 months )  Prior History Lung / other cancer no (Diagnosis within the last 5 years already requiring surveillance chest CT Scans).  Smoking Status Current Smoker  Former Smokers: Years since quit: NA  Quit Date: NA  Visit Components:  Discussion included one or more decision making aids. yes  Discussion included risk/benefits of screening. yes  Discussion included potential follow up diagnostic testing for abnormal scans. yes  Discussion included meaning and risk of over diagnosis. yes  Discussion included meaning and risk of False Positives. yes  Discussion included meaning of total radiation exposure. yes  Counseling Included:  Importance of adherence to annual lung cancer LDCT screening. yes  Impact of comorbidities on ability to participate in the program. yes  Ability and willingness to under diagnostic treatment. yes  Smoking Cessation Counseling:  Current Smokers:   Discussed importance of smoking cessation. yes  Information about tobacco cessation classes and interventions provided to patient. yes  Patient provided with "ticket" for LDCT Scan. yes  Symptomatic Patient. no  Counseling  Diagnosis Code: Tobacco Use Z72.0  Asymptomatic Patient yes  Counseling (Intermediate counseling: > three minutes counseling) B9390  Former Smokers:   Discussed the importance of maintaining cigarette abstinence. yes  Diagnosis Code: Personal History of Nicotine Dependence. Z00.923  Information about tobacco cessation classes and interventions provided to patient. Yes  Patient provided with "ticket" for LDCT Scan. yes  Written Order for Lung Cancer  Screening with LDCT placed in Epic. Yes (CT Chest Lung Cancer Screening Low Dose W/O CM) [id number] Z12.2-Screening of respiratory organs Z87.891-Personal history of nicotine dependence  BP (!) 100/58 (BP Location: Left Arm, Cuff Size: Normal)   Pulse 78   Temp 98.3 F (36.8 C) (Oral)   Ht 5\' 5"  (1.651 m)   Wt 131 lb 6.4 oz (59.6 kg)   SpO2 99%   BMI 21.87 kg/m    I have spent 25 minutes of face to face time with [name] discussing the risks and benefits of lung cancer screening. We viewed a power point together that explained in detail the above noted topics. We paused at intervals to allow for questions to be asked and answered to ensure understanding.We discussed that the single most powerful action that she can take to decrease her risk of developing lung cancer is to quit smoking. We discussed whether or not she is ready to commit to setting a quit date. We discussed options for tools to aid in quitting smoking including nicotine replacement therapy, non-nicotine medications, support groups, Quit Smart classes, and behavior modification. We discussed that often times setting smaller, more achievable goals, such as eliminating 1 cigarette a day for a week and then 2 cigarettes a day for a week can be helpful in slowly decreasing the number of cigarettes smoked. This allows for a sense of accomplishment as well as providing a clinical benefit. I gave her the " Be Stronger Than Your Excuses" card with contact information for community resources, classes, free nicotine replacement therapy, and access to mobile apps, text messaging, and on-line smoking cessation help. I have also given her my card and contact information in the event she needs to contact me. We discussed  the time and location of the scan, and that either Doris Glassman RN or I will call with the results within 24-48 hours of receiving them. I have offered her  a copy of the power point we viewed  as a resource in the event they need  reinforcement of the concepts we discussed today in the office. The patient verbalized understanding of all of  the above and had no further questions upon leaving the office. They have my contact information in the event they have any further questions.  I spent 3 minutes counseling on smoking cessation and the health risks of continued tobacco abuse.  I explained to the patient that there has been a high incidence of coronary artery disease noted on these exams. I explained that this is a non-gated exam therefore degree or severity cannot be determined. This patient is not on statin therapy. I have asked the patient to follow-up with their PCP regarding any incidental finding of coronary artery disease and management with diet or medication as their PCP  feels is clinically indicated. The patient verbalized understanding of the above and had no further questions upon completion of the visit.    Magdalen Spatz, NP 12/12/2018 3:26 PM

## 2018-12-13 ENCOUNTER — Ambulatory Visit (INDEPENDENT_AMBULATORY_CARE_PROVIDER_SITE_OTHER): Payer: Commercial Managed Care - PPO | Admitting: Family Medicine

## 2018-12-13 ENCOUNTER — Ambulatory Visit: Payer: Self-pay

## 2018-12-13 ENCOUNTER — Telehealth: Payer: Self-pay | Admitting: Family Medicine

## 2018-12-13 ENCOUNTER — Encounter: Payer: Self-pay | Admitting: Family Medicine

## 2018-12-13 DIAGNOSIS — M545 Low back pain, unspecified: Secondary | ICD-10-CM

## 2018-12-13 MED ORDER — HYDROCODONE-ACETAMINOPHEN 5-325 MG PO TABS: 1.0000 | ORAL_TABLET | Freq: Four times a day (QID) | ORAL | 0 refills | Status: DC | PRN

## 2018-12-13 NOTE — Telephone Encounter (Signed)
Patient called left voicemail message that the note she received has the incorrect date to return to work. Patient said the note should read she is going to return back to work on Monday instead of Friday. Patient asked if the note can be emailed to her at [email or web address]  The number to contact patient is [phone]

## 2018-12-13 NOTE — Progress Notes (Signed)
Office Visit Note   Patient: Doris Bender           Date of Birth: 04-Jan-1961           MRN: [id number] Visit Date: 12/13/2018 Requested by: Rory Percy, MD [place],  [place] [address] PCP: Rory Percy, MD  Subjective: Chief Complaint  Patient presents with  . Lower Back - Pain    Right-sided low back pain since bending over yesterday to pick up a trash can. Hurts to stand up straight. Wearing her old lumbar corset - helps while standing, but it slides up when she sits.     HPI: She is here with right-sided low back pain.  Yesterday she bent to the left to move a trash can felt sudden severe pain.  She has a history of L4 compression fracture but this pain feels different.  It is marked in the side.  She is not having any radicular symptoms.  She feels best when bending forward and to the left.  She took some hydrocodone last night with minimal improvement.  She has a history of osteoporosis but did not tolerate Prolia injections.  She does not take calcium because of history of calcium deposits as well as CPPD.  She was concerned that it might make these things worse.               ROS: No fevers or chills.  All other systems were reviewed and are negative.  Objective: Vital Signs: There were no vitals taken for this visit.  Physical Exam:  General:  Alert and oriented, in no acute distress. Pulm:  Breathing unlabored. Psy:  Normal mood, congruent affect. Skin: No rash on the skin. Low back: She does have a little tenderness near the L4 and L5 spinous processes but maximum tenderness is along the right SI joint.  She is moderately tender in the sciatic notch as well.  Straight leg raise is equivocal, lower extremity strength and reflexes are normal.  Imaging: X-rays lumbar spine: No change in the L4 compression deformity compared to 2019.  I do not see a new compression fracture.    Assessment & Plan: 1.  Acute right-sided low back/posterior hip pain, suspect  sacroiliac dysfunction. -Refilled hydrocodone to take as needed.  Referral to physical therapy in [place].  Follow-up as needed.     Procedures: No procedures performed  No notes on file     PMFS History: Patient Active Problem List   Diagnosis Date Noted  . Smoker 09/24/2017  . Primary osteoarthritis of both hands 09/24/2017  . Autoimmune disease ([facility]) 09/24/2017  . History of osteoporosis 09/13/2017  . History of vertebral compression fracture 09/13/2017  . Raynaud's syndrome without gangrene 09/13/2017  . Pseudogout 09/13/2017  . History of migraine 09/13/2017  . Left lateral epicondylitis 07/24/2015  . Back pain 07/24/2013  . Compression fracture of L4 vertebra ([facility]) 07/24/2013  . ANA positive 05/31/2013  . Osteoporosis 05/31/2013  . Raynaud phenomenon 05/31/2013  . Breast cancer ([place]) 05/30/2013  . Cancer ([place]) 05/04/2013  . Breast cancer of upper-outer quadrant of left female breast ([place]) 10/18/2012   Past Medical History:  Diagnosis Date  . Arthritis    lt great toe (RA)  . Breast cancer ([facility]) 11/15/12   left  . Cancer ([place])    breast  . Compression fracture of L4 lumbar vertebra 11/04/2012   injury  . Contact lens/glasses fitting    wears contacts or glasses  . Gout  8-9 yrs ago   Rt heel/foot  . History of radiation therapy 02/20/13-04/07/13   left breast 50.4 gray, upper outer quadrant boosted to 62.4 gray  . Osteoporosis   . Pseudogout of joint of left foot 6 yrs ago  . Raynaud's disease    bilateral hands and feet  . Wears partial dentures    partial lower    Family History  Problem Relation Age of Onset  . Hyperthyroidism Mother   . Cirrhosis Father   . Diabetes Brother   . Heart attack Maternal Grandmother   . Heart attack Maternal Grandfather     Past Surgical History:  Procedure Laterality Date  . BREAST LUMPECTOMY WITH NEEDLE LOCALIZATION AND AXILLARY SENTINEL LYMPH NODE BX Left 12/21/2012   Procedure: BREAST LUMPECTOMY WITH NEEDLE  LOCALIZATION AND AXILLARY SENTINEL LYMPH NODE BX;  Surgeon: Haywood Lasso, MD;  Location: [facility];  Service: General;  Laterality: Left;  needle local BCG    . BREAST SURGERY  age 58   left breast bx- benign  . CERVICAL CONIZATION W/BX  2012   benign  . ELBOW SURGERY    . KNEE ARTHROPLASTY    . KNEE ARTHROSCOPY  2006   right  . LATERAL EPICONDYLE RELEASE  03/08/2012   Procedure: TENNIS ELBOW RELEASE;  Surgeon: Cammie Sickle., MD;  Location: [facility];  Service: Orthopedics;  Laterality: Right;  RECONSTRUCTION OF RIGHT LATERAL ELBOW WITH TENDONS  . MASS EXCISION  03/08/2012   Procedure: EXCISION MASS;  Surgeon: Cammie Sickle., MD;  Location: [facility];  Service: Orthopedics;  Laterality: Right;  EXCISION OF RIGHT WRIST DORSAL CYST  . TENNIS ELBOW RELEASE/NIRSCHEL PROCEDURE Left 09/12/2015   Procedure: ARTHROTOMY LEFT ELBOW, REPAIR EXTENSOR ORIGIN;  Surgeon: Daryll Brod, MD;  Location: [facility];  Service: Orthopedics;  Laterality: Left;  . TONSILLECTOMY    . TONSILLECTOMY     58yrs old  . WRIST GANGLION EXCISION     left x2   Social History   Occupational History  . Not on file  Tobacco Use  . Smoking status: Current Every Day Smoker    Packs/day: 0.75    Years: 50.00    Pack years: 37.50    Types: Cigarettes  . Smokeless tobacco: Never Used  . Tobacco comment: 1ppd or less, 37 pack year smoking history minimally  Substance and Sexual Activity  . Alcohol use: Yes    Comment: rare 1 yearly  . Drug use: No  . Sexual activity: Not on file    Comment: menarche age 2, miscarriage x 1, P61,  age 58 w/birth of son, Depo-Provera x 20 yrs

## 2018-12-14 ENCOUNTER — Telehealth: Payer: Self-pay | Admitting: Family Medicine

## 2018-12-14 ENCOUNTER — Other Ambulatory Visit: Payer: Self-pay | Admitting: *Deleted

## 2018-12-14 DIAGNOSIS — Z122 Encounter for screening for malignant neoplasm of respiratory organs: Secondary | ICD-10-CM

## 2018-12-14 DIAGNOSIS — F17210 Nicotine dependence, cigarettes, uncomplicated: Secondary | ICD-10-CM

## 2018-12-14 NOTE — Telephone Encounter (Signed)
I called the patient and advised her a new note has been written, for her to return to work on 12/19/2018. Tammy, in medical records, will email that to her today per request.

## 2018-12-14 NOTE — Telephone Encounter (Signed)
Yes, that's fine 

## 2018-12-14 NOTE — Telephone Encounter (Signed)
Patient called advised her email address is [email or web address]  Patient asked if her employer an get it today. The number to contact patient is [phone]

## 2018-12-14 NOTE — Telephone Encounter (Signed)
I called the patient about this. See other message on this subject. New note has been written and is being emailed to the patient.

## 2018-12-14 NOTE — Telephone Encounter (Signed)
Ok to change the date?

## 2018-12-14 NOTE — Telephone Encounter (Signed)
Patient states her out of work note need to be changed to return on Monday 12/19/18.

## 2018-12-14 NOTE — Telephone Encounter (Signed)
I called the patient to let her know the letter was resent to the corrected email address - she checked while I was on the phone - letter received.

## 2018-12-16 ENCOUNTER — Ambulatory Visit (HOSPITAL_COMMUNITY): Payer: Commercial Managed Care - PPO | Attending: Family Medicine | Admitting: Physical Therapy

## 2018-12-16 ENCOUNTER — Telehealth: Payer: Self-pay | Admitting: Family Medicine

## 2018-12-16 ENCOUNTER — Encounter (HOSPITAL_COMMUNITY): Payer: Self-pay | Admitting: Physical Therapy

## 2018-12-16 ENCOUNTER — Other Ambulatory Visit: Payer: Self-pay

## 2018-12-16 DIAGNOSIS — R29898 Other symptoms and signs involving the musculoskeletal system: Secondary | ICD-10-CM

## 2018-12-16 DIAGNOSIS — M5441 Lumbago with sciatica, right side: Secondary | ICD-10-CM

## 2018-12-16 DIAGNOSIS — R262 Difficulty in walking, not elsewhere classified: Secondary | ICD-10-CM

## 2018-12-16 DIAGNOSIS — M6281 Muscle weakness (generalized): Secondary | ICD-10-CM | POA: Diagnosis present

## 2018-12-16 NOTE — Therapy (Signed)
[place], [place], [address] Phone: [phone]   Fax:  [phone]  Physical Therapy Evaluation  Patient Details  Name: Doris Bender MRN: [id number] Date of Birth: 28-Jan-1961 Referring Provider (PT): Eunice Blase    Encounter Date: 12/16/2018  PT End of Session - 12/16/18 1809    Visit Number  1    Number of Visits  9    Date for PT Re-Evaluation  01/13/19    Authorization Type  [employer] PPO (30 visits PT/OT/manipulation 0 used) and [employer] PPO (60 PT/OT/SLP combined, 0 used)    Authorization Time Period  12/16/18 to 01/13/19    Authorization - Visit Number  1    Authorization - Number of Visits  10    PT Start Time  9518    PT Stop Time  1700    PT Time Calculation (min)  45 min    Activity Tolerance  Patient limited by pain    Behavior During Therapy  Restless;Anxious       Past Medical History:  Diagnosis Date  . Arthritis    lt great toe (RA)  . Breast cancer ([facility]) 11/15/12   left  . Cancer ([facility])    breast  . Compression fracture of L4 lumbar vertebra 11/04/2012   injury  . Contact lens/glasses fitting    wears contacts or glasses  . Gout 8-9 yrs ago   Rt heel/foot  . History of radiation therapy 02/20/13-04/07/13   left breast 50.4 gray, upper outer quadrant boosted to 62.4 gray  . Osteoporosis   . Pseudogout of joint of left foot 6 yrs ago  . Raynaud's disease    bilateral hands and feet  . Wears partial dentures    partial lower    Past Surgical History:  Procedure Laterality Date  . BREAST LUMPECTOMY WITH NEEDLE LOCALIZATION AND AXILLARY SENTINEL LYMPH NODE BX Left 12/21/2012   Procedure: BREAST LUMPECTOMY WITH NEEDLE LOCALIZATION AND AXILLARY SENTINEL LYMPH NODE BX;  Surgeon: Haywood Lasso, MD;  Location: [facility];  Service: General;  Laterality: Left;  needle local BCG    . BREAST SURGERY  age 58   left breast bx- benign  . CERVICAL CONIZATION W/BX  2012    benign  . ELBOW SURGERY    . KNEE ARTHROPLASTY    . KNEE ARTHROSCOPY  2006   right  . LATERAL EPICONDYLE RELEASE  03/08/2012   Procedure: TENNIS ELBOW RELEASE;  Surgeon: Cammie Sickle., MD;  Location: [facility];  Service: Orthopedics;  Laterality: Right;  RECONSTRUCTION OF RIGHT LATERAL ELBOW WITH TENDONS  . MASS EXCISION  03/08/2012   Procedure: EXCISION MASS;  Surgeon: Cammie Sickle., MD;  Location: [facility];  Service: Orthopedics;  Laterality: Right;  EXCISION OF RIGHT WRIST DORSAL CYST  . TENNIS ELBOW RELEASE/NIRSCHEL PROCEDURE Left 09/12/2015   Procedure: ARTHROTOMY LEFT ELBOW, REPAIR EXTENSOR ORIGIN;  Surgeon: Daryll Brod, MD;  Location: [facility];  Service: Orthopedics;  Laterality: Left;  . TONSILLECTOMY    . TONSILLECTOMY     58yrs old  . WRIST GANGLION EXCISION     left x2    There were no vitals filed for this visit.   Subjective Assessment - 12/16/18 1615    Subjective  My back is hurting, it usually hurts when I do my weekend cleaning but I didn't think anything about it that weekend. I went to work Monday  the 10th, I went to pick up plastic trash can that weighs almost nothing and I had immediate sharp pain. If I sit too long it hurts, standing is also bad. Walking hurts. Riding in the car hurts. No numbness/tingling, just sharp stabbing pain. My husband is making me use the rollator as he's scared I'll fall. No saddle nubmness, no bowel or bladder numbness. It can radiate into my hips all the way across and into tail bone. It does not go down my legs, just to my butt level.    How long can you sit comfortably?  constant discomfort    How long can you stand comfortably?  constant discomfort    How long can you walk comfortably?  constant discomfort    Patient Stated Goals  make pain go away and return to work    Currently in Pain?  Yes    Pain Score  7     Pain Location  Back    Pain Orientation  Right;Left;Lower     Pain Descriptors / Indicators  Stabbing;Sharp    Pain Type  Acute pain    Pain Radiating Towards  into gluteal regions    Pain Onset  In the past 7 days    Pain Frequency  Constant    Aggravating Factors   sitting/standing/walking too long    Pain Relieving Factors  laying down on my left side with my knees pulled up    Effect of Pain on Daily Activities  severe         [facility] PT Assessment - 12/16/18 0001      Assessment   Medical Diagnosis  acute back pain with R sciatica     Referring Provider (PT)  Legrand Como [place]     Onset Date/Surgical Date  12/12/18    Next MD Visit  not scheduled     Prior Therapy  on knee after surgery and on both elbows post-op       Precautions   Precautions  Back    Precaution Comments  no heavy lifting, no pushing/pulling       Restrictions   Weight Bearing Restrictions  No      Balance Screen   Has the patient fallen in the past 6 months  No    Has the patient had a decrease in activity level because of a fear of falling?   Yes    Is the patient reluctant to leave their home because of a fear of falling?   No      Home Environment   Living Environment  Private residence    Additional Comments  2STE, no rails; lives with husband but he is gone 5am-3pm      Prior Function   Level of Independence  Independent;Independent with basic ADLs    Vocation  Full time employment    Vocation Requirements  department of social services, runs file room and Pharmacist, hospital. Up and down multiple steps and walking a lot.     Leisure  swimming, beach       Observation/Other Assessments   Observations  L LE anterior rotation of SI; unable to performMET due to pain limitations today       Posture/Postural Control   Posture/Postural Control  Postural limitations    Postural Limitations  Rounded Shoulders;Forward head;Decreased lumbar lordosis;Decreased thoracic kyphosis;Flexed trunk    Posture Comments  unable to even reach neutral extension in standing  due to pain  ROM / Strength   AROM / PROM / Strength  AROM;Strength      AROM   AROM Assessment Site  Lumbar    Lumbar Flexion  moderate limitation, painful, compensates with knee flexion     Lumbar Extension  extreme limitation- painful and unable to reach neutral     Lumbar - Right Side Bend  severe limitation and painful     Lumbar - Left Side Bend  severe limitation and painful       Strength   Strength Assessment Site  Hip;Knee;Ankle    Right/Left Hip  Right;Left    Right Hip Flexion  3-/5   pain and tightness in back    Right Hip Extension  2/5   based off of functional bridge    Right Hip ABduction  3/5    Left Hip Flexion  3/5   pain and tightness in back    Left Hip Extension  2/5   based off of functional bridge    Right/Left Knee  Right;Left    Right Knee Flexion  4+/5    Right Knee Extension  3/5   pain and tighntess in back    Left Knee Flexion  4+/5    Left Knee Extension  3/5   pain and tightness in back    Right/Left Ankle  Right;Left    Right Ankle Dorsiflexion  5/5    Left Ankle Dorsiflexion  5/5      Flexibility   Soft Tissue Assessment /Muscle Length  yes      Ambulation/Gait   Assistive device  4-wheeled walker    Gait Pattern  Step-through pattern;Decreased arm swing - right;Decreased arm swing - left;Decreased step length - right;Decreased step length - left;Decreased stance time - right;Decreased stance time - left;Decreased stride length;Antalgic;Decreased trunk rotation;Trunk flexed                Objective measurements completed on examination: See above findings.      [place] Adult PT Treatment/Exercise - 12/16/18 0001      Exercises   Exercises  Lumbar      Lumbar Exercises: Supine   Other Supine Lumbar Exercises  decompression exercises 2-4 5x each LE              PT Education - 12/16/18 1808    Education Details  exam findings, POC, HEP; importance of motion and activity in managing back pain, role of  guarding to protect site of injury but how it can spiral into chronic pain cycle without motion nad activity; importance of avoiding extended time in flexed positions due to strain on low back as well as her history of vertebral compression fracture L4    Person(s) Educated  Patient    Methods  Explanation;Handout    Comprehension  Verbalized understanding;Returned demonstration       PT Short Term Goals - 12/16/18 1816      PT SHORT TERM GOAL #1   Title  Patient to be compliant with appropriate HEP, to be updated PRN    Time  2    Period  Weeks    Status  New    Target Date  12/30/18      PT SHORT TERM GOAL #2   Title  Patient to be able to maintain neutral posture without increased pain to avoid excessive stress on lumbar spine and to reduce risk of further compression fractures    Time  2    Period  Weeks  Status  New      PT SHORT TERM GOAL #3   Title  Patient to experience back pain as no more than 5/10 in order to improve QOL and tolerance to activity    Time  2    Period  Weeks    Status  New        PT Long Term Goals - 12/16/18 1818      PT LONG TERM GOAL #1   Title  Patient to demonstrate at least 1 MMT grade improvement in all tested groups in order to show improved strength    Time  4    Period  Weeks    Status  New    Target Date  01/13/19      PT LONG TERM GOAL #2   Title  Patient to demonstrate only mild limitation all planes of lumbar ROM in order to show improved mobility and to assist in reducing pain    Time  4    Period  Weeks    Status  New      PT LONG TERM GOAL #3   Title  Patient to report pain as being no more than 3/10 in order to improve QOL and assist in return to work    Time  4    Period  Weeks    Status  New      PT LONG TERM GOAL #4   Title  Patient to be able to tolerate at least 30-60 minutes of standing and walking with no seated rest and no increase in pain in order to assist in preparation for return to work    Time  [place] - 12/16/18 1812    Clinical Impression Statement  [name] arrives clearly in a considerable amount of pain, with great difficulty in walking from waiting room to exam room with rollator today; she reports that her only relief is to lay on her left side with her knees to her chest, otherwise her pain is constant. Examination reveals severe postural limitations as she is not able to even come to neutral trunk extension without considerable pain, severe functional muscle weakness and lumbar ROM, poor functional mobility skills, poor functional task tolerance, and gait impairment. Of note, imaging report shows significant lack of normal spinal curvature. She will benefit from a trial of skilled PT services to attempt to reduce pain, improve function, and assist in potential return to work strategies.    Personal Factors and Comorbidities  Age;Behavior Pattern;Fitness;Comorbidity 3+;Profession;Past/Current Experience    Comorbidities  raynauds, hx compression fracture L4 level, pseudogout, autoimmune disease, hx breast cancer, knee arthroscopy, B tennis elbow releases    Examination-Activity Limitations  Lift;Stand;Locomotion Level;Bed Mobility;Bend;Transfers;Reach Overhead;Caring for Others;Sit;Sleep;Dressing;Squat;Stairs    Examination-Participation Restrictions  Church;Laundry;Shop;Cleaning;Community Activity;Yard Work;Meal Prep;Driving    Stability/Clinical Decision Making  Stable/Uncomplicated    Clinical Decision Making  Low    PT Frequency  2x / week    PT Duration  4 weeks    PT Treatment/Interventions  ADLs/Self Care Home Management;Electrical Stimulation;Iontophoresis 4mg /ml Dexamethasone;Moist Heat;Ultrasound;DME Instruction;Gait training;Stair training;Functional mobility training;Therapeutic activities;Patient/family education;Neuromuscular re-education;Balance training;Therapeutic exercise;Orthotic Fit/Training;Manual techniques;Passive  range of motion;Dry needling;Energy conservation;Taping;Visual/perceptual remediation/compensation    PT Next Visit Plan  review HEP and promote activity/exercise instead of laying in bed. Encourage importance of avoiding flexed postures. Decompresison exercises, core strength, activity as tolerated. Avoid  cold due to raynauds.    PT Home Exercise Plan  Eval: laying supine instead of L side lying tucked into a ball, walking program, decompression exercises    Consulted and Agree with Plan of Care  Patient       Patient will benefit from skilled therapeutic intervention in order to improve the following deficits and impairments:  Abnormal gait, Increased fascial restricitons, Improper body mechanics, Pain, Decreased coordination, Decreased mobility, Increased muscle spasms, Postural dysfunction, Decreased activity tolerance, Decreased range of motion, Decreased strength, Difficulty walking, Impaired flexibility  Visit Diagnosis: 1. Acute right-sided low back pain with right-sided sciatica   2. Muscle weakness (generalized)   3. Difficulty in walking, not elsewhere classified   4. Other symptoms and signs involving the musculoskeletal system        Problem List Patient Active Problem List   Diagnosis Date Noted  . Smoker 09/24/2017  . Primary osteoarthritis of both hands 09/24/2017  . Autoimmune disease ([facility]) 09/24/2017  . History of osteoporosis 09/13/2017  . History of vertebral compression fracture 09/13/2017  . Raynaud's syndrome without gangrene 09/13/2017  . Pseudogout 09/13/2017  . History of migraine 09/13/2017  . Left lateral epicondylitis 07/24/2015  . Back pain 07/24/2013  . Compression fracture of L4 vertebra ([facility]) 07/24/2013  . ANA positive 05/31/2013  . Osteoporosis 05/31/2013  . Raynaud phenomenon 05/31/2013  . Breast cancer ([facility]) 05/30/2013  . Cancer ([facility]) 05/04/2013  . Breast cancer of upper-outer quadrant of left female breast ([facility]) 10/18/2012    Deniece Ree  PT, DPT, CBIS  Supplemental Physical Therapist [facility]    Pager [phone] Acute Rehab Office [name] [address] [place], [place], [address] Phone: [phone]   Fax:  [phone]  Name: ELLIANNA RUEST MRN: [id number] Date of Birth: 09/17/1960

## 2018-12-16 NOTE — Telephone Encounter (Signed)
I called an advised the patient. New note written for her to return to work on 12/26/18. Emailed to the patient.

## 2018-12-16 NOTE — Telephone Encounter (Signed)
Ok to keep out of work another week.

## 2018-12-16 NOTE — Telephone Encounter (Signed)
The patient is asking for a work not extension. The original note has her returning to work on Monday. She still cannot stand up straight due to the pain. Her first PT session is today at 4:15. OK to extend the note? If so, how long?

## 2018-12-16 NOTE — Patient Instructions (Signed)
LAYING IN SUPINE  Especially with your history of compression fracture, it is important to avoid bent or flexed positions (which you tend to favor due to pain) as this puts a LOT of stress on the back bone where the compression fracture is, and also puts strain on a lot of different tissues in your back.   Make sure you are spending time laying flat on your back in your bed because you are at least able to be in a neutral position without pain.   WALKING PROGRAM  It is important that you are not laying in bed all day long.   Try to get up at least 1x/hour and walk around your house.  As your pain gets better, increase how frequently you are walking and/or how long.

## 2018-12-16 NOTE — Telephone Encounter (Signed)
Patient left a message requesting another work note for next week.  She is doing therapy today, but states that she can hardly stand up due to pain.  She stated that you can send it to her email.  CB#[phone].  Thank you.

## 2018-12-19 ENCOUNTER — Ambulatory Visit (HOSPITAL_COMMUNITY): Payer: Commercial Managed Care - PPO | Admitting: Physical Therapy

## 2018-12-20 ENCOUNTER — Telehealth (HOSPITAL_COMMUNITY): Payer: Self-pay | Admitting: Physical Therapy

## 2018-12-20 NOTE — Telephone Encounter (Signed)
Therapist called patient to move an appointment and patient requested therapist discharge at this time.  States she is much better, returning to work on Monday and will not have time to come to therapy.  States she is doing her exercises and needs no additional assistance.  Evaluating therapist notified.  Informed she will have to get another order from MD if she wishes to return.  Teena Irani, PTA/CLT [phone]

## 2018-12-22 ENCOUNTER — Encounter (HOSPITAL_COMMUNITY): Payer: Self-pay | Admitting: Physical Therapy

## 2018-12-22 NOTE — Therapy (Signed)
[place], [place], [address] Phone: [phone]   Fax:  [phone]  Patient Details  Name: Doris Bender MRN: [id number] Date of Birth: [birth year] Referring Provider:  No ref. provider found  Encounter Date: 12/22/2018  PHYSICAL THERAPY DISCHARGE SUMMARY  Visits from Start of Care: 1  Current functional level related to goals / functional outcomes: Patient requesting DC as she is feeling much better since the evaluation and is returning to work. DC per patient request.    Remaining deficits: Unable to assess    Education / Equipment: DC per her request, will need new MD order to return to PT  Plan: Patient agrees to discharge.  Patient goals were not met. Patient is being discharged due to the patient's request.  ?????      Deniece Ree PT, DPT, CBIS  Supplemental Physical Therapist [facility]    Pager [phone] Acute Rehab Office [place], [place], [address] Phone: [phone]   Fax:  [phone]

## 2018-12-23 ENCOUNTER — Encounter

## 2018-12-26 ENCOUNTER — Encounter (HOSPITAL_COMMUNITY): Payer: Commercial Managed Care - PPO | Admitting: Physical Therapy

## 2018-12-28 ENCOUNTER — Encounter (HOSPITAL_COMMUNITY): Payer: Commercial Managed Care - PPO | Admitting: Physical Therapy

## 2018-12-30 ENCOUNTER — Encounter (HOSPITAL_COMMUNITY): Payer: Commercial Managed Care - PPO | Admitting: Physical Therapy

## 2019-01-04 ENCOUNTER — Encounter (HOSPITAL_COMMUNITY): Payer: Commercial Managed Care - PPO

## 2019-01-05 ENCOUNTER — Encounter (HOSPITAL_COMMUNITY): Payer: Commercial Managed Care - PPO | Admitting: Physical Therapy

## 2019-01-06 ENCOUNTER — Encounter (HOSPITAL_COMMUNITY): Payer: Commercial Managed Care - PPO

## 2019-01-10 ENCOUNTER — Encounter (HOSPITAL_COMMUNITY): Payer: Commercial Managed Care - PPO | Admitting: Physical Therapy

## 2019-01-11 ENCOUNTER — Encounter (HOSPITAL_COMMUNITY): Payer: Commercial Managed Care - PPO

## 2019-01-11 ENCOUNTER — Encounter (HOSPITAL_COMMUNITY): Payer: Commercial Managed Care - PPO | Admitting: Physical Therapy

## 2019-01-12 ENCOUNTER — Encounter (HOSPITAL_COMMUNITY): Payer: Commercial Managed Care - PPO

## 2019-01-13 ENCOUNTER — Encounter (HOSPITAL_COMMUNITY): Payer: Commercial Managed Care - PPO | Admitting: Physical Therapy

## 2019-01-13 ENCOUNTER — Encounter (HOSPITAL_COMMUNITY): Payer: Commercial Managed Care - PPO

## 2019-01-17 ENCOUNTER — Encounter (HOSPITAL_COMMUNITY): Payer: Commercial Managed Care - PPO

## 2019-01-18 ENCOUNTER — Encounter (HOSPITAL_COMMUNITY): Payer: Commercial Managed Care - PPO | Admitting: Physical Therapy

## 2019-01-20 ENCOUNTER — Encounter (HOSPITAL_COMMUNITY): Payer: Commercial Managed Care - PPO

## 2019-01-31 ENCOUNTER — Encounter (HOSPITAL_COMMUNITY): Payer: Commercial Managed Care - PPO | Admitting: Physical Therapy

## 2019-02-02 ENCOUNTER — Encounter (HOSPITAL_COMMUNITY): Payer: Commercial Managed Care - PPO

## 2019-05-02 ENCOUNTER — Ambulatory Visit: Payer: Commercial Managed Care - PPO | Admitting: Rheumatology

## 2019-05-23 ENCOUNTER — Ambulatory Visit: Payer: Commercial Managed Care - PPO | Admitting: Rheumatology

## 2019-07-07 NOTE — Progress Notes (Signed)
Office Visit Note  Patient: Doris Bender             Date of Birth: [birth year]           MRN: [id number]             PCP: Rory Percy, MD Referring: Rory Percy, MD Visit Date: 07/13/2019 Occupation: @GUAROCC @  Subjective:  Other (recent flare in left foot. pain/swelling)   History of Present Illness: Doris Bender is a 59 y.o. female with history of limited systemic sclerosis.  Patient states that she has been having extreme pain and discomfort in her left foot which she describes over the left first second and third toe.  She states she has difficulty walking over her metatarsals.  She has been walking on her heel.  She describes some discomfort in her hands.  She continues to have some Raynaud's symptoms.  She had seen Dr. Haroldine Laws and Dr. Lake Bells in September 2019 and has not had a follow-up visit since then.  She denies any palpitations or shortness of breath.  Activities of Daily Living:  Patient reports morning stiffness for 30-45 minutes.   Patient Denies nocturnal pain.  Difficulty dressing/grooming: Denies Difficulty climbing stairs: Reports Difficulty getting out of chair: Denies Difficulty using hands for taps, buttons, cutlery, and/or writing: Reports  Review of Systems  Constitutional: Positive for fatigue.  HENT: Positive for mouth dryness. Negative for mouth sores and nose dryness.   Eyes: Negative for pain, itching, visual disturbance and dryness.  Respiratory: Negative for cough, hemoptysis, shortness of breath and difficulty breathing.   Cardiovascular: Negative for chest pain, palpitations, hypertension and swelling in legs/feet.  Gastrointestinal: Negative for blood in stool, constipation and diarrhea.  Endocrine: Negative for increased urination.  Genitourinary: Negative for difficulty urinating and painful urination.  Musculoskeletal: Positive for arthralgias, joint pain, joint swelling and morning stiffness. Negative for myalgias, muscle weakness,  muscle tenderness and myalgias.  Skin: Positive for color change and rash. Negative for pallor, hair loss, nodules/bumps, skin tightness, ulcers and sensitivity to sunlight.  Allergic/Immunologic: Negative for susceptible to infections.  Neurological: Positive for headaches. Negative for dizziness, memory loss and weakness.  Hematological: Negative for bruising/bleeding tendency and swollen glands.  Psychiatric/Behavioral: Negative for depressed mood, confusion and sleep disturbance. The patient is not nervous/anxious.     PMFS History:  Patient Active Problem List   Diagnosis Date Noted  . Smoker 09/24/2017  . Primary osteoarthritis of both hands 09/24/2017  . Autoimmune disease ([place]) 09/24/2017  . History of osteoporosis 09/13/2017  . History of vertebral compression fracture 09/13/2017  . Raynaud's syndrome without gangrene 09/13/2017  . Pseudogout 09/13/2017  . History of migraine 09/13/2017  . Left lateral epicondylitis 07/24/2015  . Back pain 07/24/2013  . Compression fracture of L4 vertebra (HCC) 07/24/2013  . ANA positive 05/31/2013  . Osteoporosis 05/31/2013  . Raynaud phenomenon 05/31/2013  . Breast cancer ([place]) 05/30/2013  . Cancer ([place]) 05/04/2013  . Breast cancer of upper-outer quadrant of left female breast ([place]) 10/18/2012    Past Medical History:  Diagnosis Date  . Arthritis    lt great toe (RA)  . Breast cancer ([place]) 11/15/12   left  . Cancer ([place])    breast  . Compression fracture of L4 lumbar vertebra 11/04/2012   injury  . Contact lens/glasses fitting    wears contacts or glasses  . Gout 8-9 yrs ago   Rt heel/foot  . History of radiation therapy 02/20/13-04/07/13   left breast 50.4  gray, upper outer quadrant boosted to 62.4 gray  . Osteoporosis   . Pseudogout of joint of left foot 6 yrs ago  . Raynaud's disease    bilateral hands and feet  . Wears partial dentures    partial lower    Family History  Problem Relation Age of Onset  . Hyperthyroidism  Mother   . Cirrhosis Father   . Diabetes Brother   . Heart attack Maternal Grandmother   . Heart attack Maternal Grandfather    Past Surgical History:  Procedure Laterality Date  . BREAST LUMPECTOMY WITH NEEDLE LOCALIZATION AND AXILLARY SENTINEL LYMPH NODE BX Left 12/21/2012   Procedure: BREAST LUMPECTOMY WITH NEEDLE LOCALIZATION AND AXILLARY SENTINEL LYMPH NODE BX;  Surgeon: Haywood Lasso, MD;  Location: [facility];  Service: General;  Laterality: Left;  needle local BCG    . BREAST SURGERY  age 63   left breast bx- benign  . CERVICAL CONIZATION W/BX  2012   benign  . ELBOW SURGERY    . KNEE ARTHROPLASTY    . KNEE ARTHROSCOPY  2006   right  . LATERAL EPICONDYLE RELEASE  03/08/2012   Procedure: TENNIS ELBOW RELEASE;  Surgeon: Cammie Sickle., MD;  Location: [facility];  Service: Orthopedics;  Laterality: Right;  RECONSTRUCTION OF RIGHT LATERAL ELBOW WITH TENDONS  . MASS EXCISION  03/08/2012   Procedure: EXCISION MASS;  Surgeon: Cammie Sickle., MD;  Location: [facility];  Service: Orthopedics;  Laterality: Right;  EXCISION OF RIGHT WRIST DORSAL CYST  . TENNIS ELBOW RELEASE/NIRSCHEL PROCEDURE Left 09/12/2015   Procedure: ARTHROTOMY LEFT ELBOW, REPAIR EXTENSOR ORIGIN;  Surgeon: Daryll Brod, MD;  Location: [facility];  Service: Orthopedics;  Laterality: Left;  . TONSILLECTOMY    . TONSILLECTOMY     59yrs old  . WRIST GANGLION EXCISION     left x2   Social History   Social History Narrative   ** Merged History Encounter **        There is no immunization history on file for this patient.   Objective: Vital Signs: BP 107/73 (BP Location: Right Arm, Patient Position: Sitting, Cuff Size: Normal)   Pulse (!) 103   Resp 15   Ht 5\' 5"  (1.651 m)   Wt 133 lb 12.8 oz (60.7 kg)   BMI 22.27 kg/m    Physical Exam Vitals and nursing note reviewed.  Constitutional:      Appearance: She is well-developed.  HENT:      Head: Normocephalic and atraumatic.  Eyes:     Conjunctiva/sclera: Conjunctivae normal.  Pulmonary:     Effort: Pulmonary effort is normal.  Abdominal:     General: Bowel sounds are normal.     Palpations: Abdomen is soft.  Musculoskeletal:     Cervical back: Normal range of motion.  Lymphadenopathy:     Cervical: No cervical adenopathy.  Skin:    General: Skin is warm and dry.     Capillary Refill: Capillary refill takes 2 to 3 seconds.     Comments: Sclerodactyly, telangiectasias noted.  Patient has skin tightness distal to her elbows bilaterally.  She also had a skin tightness on her bilateral lower extremities.  A small healing wound was noted on her left third digit.  Periungual erythema was noted.  Neurological:     Mental Status: She is alert and oriented to person, place, and time.  Psychiatric:        Behavior: Behavior  normal.      Musculoskeletal Exam:   CDAI Exam: CDAI Score: -- Patient Global: --; Provider Global: -- Swollen: --; Tender: -- Joint Exam 07/13/2019   No joint exam has been documented for this visit   There is currently no information documented on the homunculus. Go to the Rheumatology activity and complete the homunculus joint exam.  Investigation: No additional findings.  Imaging: No results found.  Recent Labs: Lab Results  Component Value Date   WBC 4.5 10/26/2018   HGB 13.8 10/26/2018   PLT 203 10/26/2018   NA 142 10/26/2018   K 4.2 10/26/2018   CL 103 10/26/2018   CO2 35 (H) 10/26/2018   GLUCOSE 87 10/26/2018   BUN 8 10/26/2018   CREATININE 0.88 10/26/2018   BILITOT 0.5 10/26/2018   ALKPHOS 83 01/05/2016   AST 13 10/26/2018   ALT 10 10/26/2018   PROT 6.4 10/26/2018   ALBUMIN 4.3 01/05/2016   CALCIUM 9.2 10/26/2018   GFRAA 83 10/26/2018    Speciality Comments: No specialty comments available.  Procedures:  No procedures performed Allergies: Hydroxychloroquine, Prednisone, Doxycycline, Ibuprofen, Keflex [cephalexin],  Ketorolac, Lodine [etodolac], Methocarbamol, Oxycodone, Oxycodone, and Robaxin [methocarbamol]   Assessment / Plan:     Visit Diagnoses: Scleroderma ([place]) - Limited systemic sclerosis hx of raynaud's, sclerodactyly Telengectesia's ANA 1:1280 centromere, anticardiolipin IgM 29,  (SCL 70, RNP, smith, Ro, La, dsDNA negative) she has limited systemic sclerosis distal to her elbows bilaterally.  She has been experiencing some Raynaud's.  She also has electric disease on examination today.  Patient has not seen Dr. Haroldine Laws and Dr. Lake Bells since September 2019.  Have advised her to schedule follow-up appointments.  Raynaud's disease without gangrene-she has no digital ulcers.  Although she has a healing wound on her left third fingertip.  There is no signs of infection.  Primary osteoarthritis of both hands-joint protection was discussed.  She continues to have some discomfort in the bilateral CMC and first MCP joints.  Pain in left foot she describes severe pain and discomfort in her left foot over her's first and second MTP joints.  She states she has been walking for many hours at work.  She has been having difficulty walking.  On examination today she had no swelling although she has some tenderness over first and second MTP joints.  I reviewed her x-rays from July 05, 2017 done by Dr. Marlou Sa.  Which showed severe first MTP narrowing and spurring.  Have advised her to schedule a follow-up with with Dr. Charlyne Petrin.  She may need bunionectomy. Orthotics and proper fitting shoes were discussed.  I do not see any synovitis.  I will check uric acid today.  Pseudogout - Diagnosed by Dr. Jerene Pitch in the past.  Patient states this was about 20 years ago when her toe was aspirated and she had pseudogout crystals.  Smoker  History of osteoporosis-she currently does not want any treatment.  History of vertebral compression fracture  History of migraine  Malignant neoplasm of upper-outer quadrant of left female  breast, unspecified estrogen receptor status ([place])  Orders: No orders of the defined types were placed in this encounter.  No orders of the defined types were placed in this encounter.   Face-to-face time spent with patient was 30 minutes. Greater than 50% of time was spent in counseling and coordination of care.  Follow-Up Instructions: Return in about 6 months (around 01/13/2020) for Scleroderma.   Bo Merino, MD  Note - This record has been created using  Dragon software.  Chart creation errors have been sought, but may not always  have been located. Such creation errors do not reflect on  the standard of medical care. 

## 2019-07-13 ENCOUNTER — Encounter: Payer: Self-pay | Admitting: Physician Assistant

## 2019-07-13 ENCOUNTER — Ambulatory Visit (INDEPENDENT_AMBULATORY_CARE_PROVIDER_SITE_OTHER): Payer: Commercial Managed Care - PPO | Admitting: Rheumatology

## 2019-07-13 ENCOUNTER — Other Ambulatory Visit: Payer: Self-pay

## 2019-07-13 VITALS — BP 107/73 | HR 103 | Resp 15 | Ht 65.0 in | Wt 133.8 lb

## 2019-07-13 DIAGNOSIS — F17200 Nicotine dependence, unspecified, uncomplicated: Secondary | ICD-10-CM

## 2019-07-13 DIAGNOSIS — M19041 Primary osteoarthritis, right hand: Secondary | ICD-10-CM

## 2019-07-13 DIAGNOSIS — M1120 Other chondrocalcinosis, unspecified site: Secondary | ICD-10-CM

## 2019-07-13 DIAGNOSIS — M349 Systemic sclerosis, unspecified: Secondary | ICD-10-CM

## 2019-07-13 DIAGNOSIS — M79672 Pain in left foot: Secondary | ICD-10-CM

## 2019-07-13 DIAGNOSIS — C50412 Malignant neoplasm of upper-outer quadrant of left female breast: Secondary | ICD-10-CM

## 2019-07-13 DIAGNOSIS — I7300 Raynaud's syndrome without gangrene: Secondary | ICD-10-CM | POA: Diagnosis not present

## 2019-07-13 DIAGNOSIS — Z8739 Personal history of other diseases of the musculoskeletal system and connective tissue: Secondary | ICD-10-CM

## 2019-07-13 DIAGNOSIS — Z8669 Personal history of other diseases of the nervous system and sense organs: Secondary | ICD-10-CM

## 2019-07-13 DIAGNOSIS — Z8781 Personal history of (healed) traumatic fracture: Secondary | ICD-10-CM

## 2019-07-13 DIAGNOSIS — M19042 Primary osteoarthritis, left hand: Secondary | ICD-10-CM

## 2019-07-13 NOTE — Patient Instructions (Addendum)
Please schedule appointment with Dr. Haroldine Laws (cardiologist)  Please schedule appointment with Dr. Lake Bells (pulmonologist)  Please schedule appointment with Dr. Marlou Sa for foot pain.

## 2019-07-14 LAB — CBC WITH DIFFERENTIAL/PLATELET
Absolute Monocytes: 455 cells/uL (ref 200–950)
Basophils Absolute: 50 cells/uL (ref 0–200)
Basophils Relative: 1 %
Eosinophils Absolute: 120 cells/uL (ref 15–500)
Eosinophils Relative: 2.4 %
HCT: 39.9 % (ref 35.0–45.0)
Hemoglobin: 13.4 g/dL (ref 11.7–15.5)
Lymphs Abs: 1290 cells/uL (ref 850–3900)
MCH: 30.5 pg (ref 27.0–33.0)
MCHC: 33.6 g/dL (ref 32.0–36.0)
MCV: 90.9 fL (ref 80.0–100.0)
MPV: 9.4 fL (ref 7.5–12.5)
Monocytes Relative: 9.1 %
Neutro Abs: 3085 cells/uL (ref 1500–7800)
Neutrophils Relative %: 61.7 %
Platelets: 199 10*3/uL (ref 140–400)
RBC: 4.39 10*6/uL (ref 3.80–5.10)
RDW: 12.1 % (ref 11.0–15.0)
Total Lymphocyte: 25.8 %
WBC: 5 10*3/uL (ref 3.8–10.8)

## 2019-07-14 LAB — COMPLETE METABOLIC PANEL WITH GFR
AG Ratio: 2.8 (calc) — ABNORMAL HIGH (ref 1.0–2.5)
ALT: 10 U/L (ref 6–29)
AST: 13 U/L (ref 10–35)
Albumin: 4.7 g/dL (ref 3.6–5.1)
Alkaline phosphatase (APISO): 85 U/L (ref 37–153)
BUN: 11 mg/dL (ref 7–25)
CO2: 32 mmol/L (ref 20–32)
Calcium: 9.3 mg/dL (ref 8.6–10.4)
Chloride: 105 mmol/L (ref 98–110)
Creat: 0.85 mg/dL (ref 0.50–0.99)
GFR, Est African American: 86 mL/min/{1.73_m2} (ref >=60)
GFR, Est Non African American: 74 mL/min/{1.73_m2} (ref >=60)
Globulin: 1.7 g/dL (calc) — ABNORMAL LOW (ref 1.9–3.7)
Glucose, Bld: 95 mg/dL (ref 65–99)
Potassium: 4.2 mmol/L (ref 3.5–5.3)
Sodium: 140 mmol/L (ref 135–146)
Total Bilirubin: 0.5 mg/dL (ref 0.2–1.2)
Total Protein: 6.4 g/dL (ref 6.1–8.1)

## 2019-07-14 LAB — URIC ACID: Uric Acid, Serum: 3.7 mg/dL (ref 2.5–7.0)

## 2019-07-14 LAB — CARDIOLIPIN ANTIBODIES, IGG, IGM, IGA
Anticardiolipin IgA: <11 [APL'U]
Anticardiolipin IgG: <14 [GPL'U]
Anticardiolipin IgM: 26 [MPL'U] — ABNORMAL HIGH

## 2019-07-14 NOTE — Progress Notes (Signed)
Labs are stable.

## 2019-09-11 ENCOUNTER — Ambulatory Visit (INDEPENDENT_AMBULATORY_CARE_PROVIDER_SITE_OTHER): Payer: Commercial Managed Care - PPO | Admitting: Adult Health

## 2019-09-11 ENCOUNTER — Other Ambulatory Visit: Payer: Self-pay

## 2019-09-11 ENCOUNTER — Encounter: Payer: Self-pay | Admitting: Adult Health

## 2019-09-11 DIAGNOSIS — F17200 Nicotine dependence, unspecified, uncomplicated: Secondary | ICD-10-CM

## 2019-09-11 DIAGNOSIS — M359 Systemic involvement of connective tissue, unspecified: Secondary | ICD-10-CM

## 2019-09-11 DIAGNOSIS — J449 Chronic obstructive pulmonary disease, unspecified: Secondary | ICD-10-CM | POA: Diagnosis not present

## 2019-09-11 NOTE — Progress Notes (Signed)
@Patient  ID: Doris Bender, female    DOB: 1960-08-25, 59 y.o.   MRN: [id number]  Chief Complaint  Patient presents with  . Follow-up    COPD     Referring provider: Rory Percy, MD  HPI: 59 year old female active smoker followed for COPD with emphysema  Medical history significant for rheumatoid arthritis and scleroderma, Raynauds.  Breast cancer s/p XRT 2015. -lumpectomy .   TEST/EVENTS :  11/2017 HRCT> images personally reviewed showing upper lobe predominant mild centrilobular emphysema, no other pulmonary parenchymal abnormalities  August 2020 LDCT CT screening showed lung RADS 2 benign appearance.,  Emphysema.   PFT: July 2019 full pulmonary function testing showed a ratio of 59%, FEV1 1.48 L improved to 1.57 L 58% predicted, total lung capacity 5.28 L 101% predicted, residual volume 137% predict, DLCO 14.6 mL 57% predicted  Labs: 2019 positive ANA 1: 1280 centromere, anticardiolipin IgM 29, (SCL 70, RNP, Smith, SSA, SSB, dsDNA negative, lupus anticoagulant negative, beta-2 neg  09/11/2019 Follow up : COPD  Patient returns for a follow-up visit.  Last seen in 2019.  Patient has known underlying COPD with emphysema.  She also carries a diagnosis of rheumatoid arthritis and scleroderma.  Previous high-resolution CT chest in 2019 showed no evidence of interstitial lung disease, mild radiation fibrosis of the left upper lobe..  Was positive for emphysema.  She is part of the low-dose CT screening program and had her last CT chest in August 2020.  That was showed a benign appearance.  And stable emphysema. Active, denies flare of cough or wheezing .  Takes steps. No significant dyspnea .   Still smoking , has cut down 15 cigs a day, discussed cessation. Has tried the patches in past.  Cant take covid vaccine . Had severe reaction to flu shot in past, afraid to take.  Declines information for vaccine currently   Has RA and Scleroderma , not on therapy .  Follow  With  rheumatology , not currently on therapy . Has had medication intolerances in past.   Plans to retire next year. Works for [employer]    Allergies  Allergen Reactions  . Hydroxychloroquine Nausea And Vomiting and Other (See Comments)    Back, stomach/ abd pain.  . Prednisone Nausea And Vomiting    Other reaction(s): Abdominal Pain  . Doxycycline     dizzy  . Ibuprofen Hives  . Keflex [Cephalexin] Hives  . Ketorolac Hives  . Lodine [Etodolac] Hives  . Methocarbamol Nausea Only  . Oxycodone Nausea And Vomiting  . Oxycodone Nausea Only  . Robaxin [Methocarbamol] Hives     There is no immunization history on file for this patient.  Past Medical History:  Diagnosis Date  . Arthritis    lt great toe (RA)  . Breast cancer ([place]) 11/15/12   left  . Cancer ([place])    breast  . Compression fracture of L4 lumbar vertebra 11/04/2012   injury  . Contact lens/glasses fitting    wears contacts or glasses  . Gout 8-9 yrs ago   Rt heel/foot  . History of radiation therapy 02/20/13-04/07/13   left breast 50.4 gray, upper outer quadrant boosted to 62.4 gray  . Osteoporosis   . Pseudogout of joint of left foot 6 yrs ago  . Raynaud's disease    bilateral hands and feet  . Wears partial dentures    partial lower    Tobacco History: Social History   Tobacco Use  Smoking Status Current Every Day  Smoker  . Packs/day: 0.75  . Years: 50.00  . Pack years: 37.50  . Types: Cigarettes  Smokeless Tobacco Never Used  Tobacco Comment   1ppd or less, 37 pack year smoking history minimally   Ready to quit: No Counseling given: Yes Comment: 1ppd or less, 37 pack year smoking history minimally   Outpatient Medications Prior to Visit  Medication Sig Dispense Refill  . Acetaminophen (TYLENOL ARTHRITIS PAIN PO) Take by mouth as needed.    Marland Kitchen albuterol (VENTOLIN HFA) 108 (90 Base) MCG/ACT inhaler Inhale 2 puffs into the lungs as needed.    . ALPRAZolam (XANAX) 0.25 MG tablet Take 0.25 mg  by mouth 2 (two) times daily as needed.   0  . HYDROcodone-acetaminophen (NORCO/VICODIN) 5-325 MG tablet Take 1 tablet by mouth every 6 (six) hours as needed for moderate pain. 20 tablet 0   No facility-administered medications prior to visit.     Review of Systems:   Constitutional:   No  weight loss, night sweats,  Fevers, chills, fatigue, or  lassitude.  HEENT:   No headaches,  Difficulty swallowing,  Tooth/dental problems, or  Sore throat,                No sneezing, itching, ear ache, nasal congestion, post nasal drip,   CV:  No chest pain,  Orthopnea, PND, swelling in lower extremities, anasarca, dizziness, palpitations, syncope.   GI  No heartburn, indigestion, abdominal pain, nausea, vomiting, diarrhea, change in bowel habits, loss of appetite, bloody stools.   Resp:   No excess mucus, no productive cough,  No non-productive cough,  No coughing up of blood.  No change in color of mucus.  No wheezing.  No chest wall deformity  Skin: no rash or lesions.  GU: no dysuria, change in color of urine, no urgency or frequency.  No flank pain, no hematuria   MS:  Some joint pain, foot, hands     Physical Exam  BP 116/68 (BP Location: Left Arm, Patient Position: Sitting, Cuff Size: Normal)   Pulse 91   Temp 98.5 F (36.9 C) (Temporal)   Ht 5\' 5"  (1.651 m)   Wt 133 lb 9.6 oz (60.6 kg)   SpO2 96%   BMI 22.23 kg/m   GEN: A/Ox3; pleasant , NAD, well nourished    HEENT:  [place]/AT,    NOSE-clear, THROAT-clear, no lesions, no postnasal drip or exudate noted.   NECK:  Supple w/ fair ROM; no JVD; normal carotid impulses w/o bruits; no thyromegaly or nodules palpated; no lymphadenopathy.    RESP  Clear  P & A; w/o, wheezes/ rales/ or rhonchi. no accessory muscle use, no dullness to percussion  CARD:  RRR, no m/r/g, no peripheral edema, pulses intact, no cyanosis or clubbing.  GI:   Soft & nt; nml bowel sounds; no organomegaly or masses detected.   Musco: Warm bil, Mild  Sclerodactily changes of hands   Neuro: alert, no focal deficits noted.    Skin: Warm, no lesions or rashes    Lab Results:  CBC    Component Value Date/Time   WBC 5.0 07/13/2019 1540   RBC 4.39 07/13/2019 1540   HGB 13.4 07/13/2019 1540   HGB 12.8 08/08/2014 1450   HCT 39.9 07/13/2019 1540   HCT 39.3 08/08/2014 1450   PLT 199 07/13/2019 1540   PLT 173 08/08/2014 1450   MCV 90.9 07/13/2019 1540   MCV 89.4 08/08/2014 1450   MCH 30.5 07/13/2019 1540   MCHC  33.6 07/13/2019 1540   RDW 12.1 07/13/2019 1540   RDW 13.0 08/08/2014 1450   LYMPHSABS 1,290 07/13/2019 1540   LYMPHSABS 1.0 08/08/2014 1450   MONOABS 0.2 08/08/2014 1450   EOSABS 120 07/13/2019 1540   EOSABS 0.1 08/08/2014 1450   BASOSABS 50 07/13/2019 1540   BASOSABS 0.0 08/08/2014 1450    BMET    Component Value Date/Time   NA 140 07/13/2019 1540   NA 143 08/08/2014 1450   K 4.2 07/13/2019 1540   K 4.6 08/08/2014 1450   CL 105 07/13/2019 1540   CO2 32 07/13/2019 1540   CO2 27 08/08/2014 1450   GLUCOSE 95 07/13/2019 1540   GLUCOSE 143 (H) 08/08/2014 1450   BUN 11 07/13/2019 1540   BUN 9.1 08/08/2014 1450   CREATININE 0.85 07/13/2019 1540   CREATININE 0.9 08/08/2014 1450   CALCIUM 9.3 07/13/2019 1540   CALCIUM 9.1 08/08/2014 1450   GFRNONAA 74 07/13/2019 1540   GFRAA 86 07/13/2019 1540    BNP No results found for: BNP  ProBNP No results found for: PROBNP  Imaging: No results found.    PFT Results Latest Ref Rng & Units 11/16/2017  FVC-Pre L 2.48  FVC-Predicted Pre % 71  FVC-Post L 2.51  FVC-Predicted Post % 72  Pre FEV1/FVC % % 59  Post FEV1/FCV % % 63  FEV1-Pre L 1.48  FEV1-Predicted Pre % 55  FEV1-Post L 1.57  DLCO UNC% % 57  DLCO COR %Predicted % 79  TLC L 5.28  TLC % Predicted % 101  RV % Predicted % 137    No results found for: NITRICOXIDE      Assessment & Plan:   COPD (chronic obstructive pulmonary disease) (HCC) COPD with emphysema appears to be at baseline.   Patient is encouraged on smoking cessation.  So glad she is participating in the low-dose CT screening program.  She is to remain active as she is doing.  Right now she has very mild symptom burden.  Plan  Patient Instructions  Activity as tolerated.  Follow up for Low dose CT chest screening in August 2021 .  Work on not smoking .  Albuterol inhaler As needed   Follow up with Dr. Vaughan Browner in 1 year and As needed        Smoker Smoking cessation discussed  Autoimmune disease ([place]) Continue to follow-up with rheumatology.     Rexene Edison, NP 09/11/2019

## 2019-09-11 NOTE — Assessment & Plan Note (Signed)
Continue to follow-up with rheumatology. 

## 2019-09-11 NOTE — Patient Instructions (Addendum)
Activity as tolerated.  Follow up for Low dose CT chest screening in August 2021 .  Work on not smoking .  Albuterol inhaler As needed   Follow up with Dr. Vaughan Browner in 1 year and As needed

## 2019-09-11 NOTE — Assessment & Plan Note (Signed)
Smoking cessation discussed 

## 2019-09-11 NOTE — Assessment & Plan Note (Signed)
COPD with emphysema appears to be at baseline.  Patient is encouraged on smoking cessation.  So glad she is participating in the low-dose CT screening program.  She is to remain active as she is doing.  Right now she has very mild symptom burden.  Plan  Patient Instructions  Activity as tolerated.  Follow up for Low dose CT chest screening in August 2021 .  Work on not smoking .  Albuterol inhaler As needed   Follow up with Dr. Vaughan Browner in 1 year and As needed

## 2019-10-16 ENCOUNTER — Other Ambulatory Visit: Payer: Self-pay

## 2019-10-16 ENCOUNTER — Ambulatory Visit (HOSPITAL_COMMUNITY)
Admission: RE | Admit: 2019-10-16 | Discharge: 2019-10-16 | Disposition: A | Discharge status: Home / Self Care | Payer: Commercial Managed Care - PPO | Source: Ambulatory Visit | Attending: Internal Medicine | Admitting: Internal Medicine

## 2019-10-16 ENCOUNTER — Encounter (HOSPITAL_COMMUNITY): Payer: Self-pay | Admitting: Internal Medicine

## 2019-10-16 VITALS — BP 102/54 | HR 78 | Wt 131.1 lb

## 2019-10-16 DIAGNOSIS — J449 Chronic obstructive pulmonary disease, unspecified: Secondary | ICD-10-CM | POA: Insufficient documentation

## 2019-10-16 DIAGNOSIS — Z8349 Family history of other endocrine, nutritional and metabolic diseases: Secondary | ICD-10-CM | POA: Insufficient documentation

## 2019-10-16 DIAGNOSIS — Z9012 Acquired absence of left breast and nipple: Secondary | ICD-10-CM | POA: Diagnosis not present

## 2019-10-16 DIAGNOSIS — M069 Rheumatoid arthritis, unspecified: Secondary | ICD-10-CM | POA: Insufficient documentation

## 2019-10-16 DIAGNOSIS — Z881 Allergy status to other antibiotic agents status: Secondary | ICD-10-CM | POA: Diagnosis not present

## 2019-10-16 DIAGNOSIS — F17210 Nicotine dependence, cigarettes, uncomplicated: Secondary | ICD-10-CM | POA: Insufficient documentation

## 2019-10-16 DIAGNOSIS — Z853 Personal history of malignant neoplasm of breast: Secondary | ICD-10-CM | POA: Insufficient documentation

## 2019-10-16 DIAGNOSIS — I2510 Atherosclerotic heart disease of native coronary artery without angina pectoris: Secondary | ICD-10-CM | POA: Diagnosis not present

## 2019-10-16 DIAGNOSIS — M349 Systemic sclerosis, unspecified: Secondary | ICD-10-CM | POA: Diagnosis not present

## 2019-10-16 DIAGNOSIS — I272 Pulmonary hypertension, unspecified: Secondary | ICD-10-CM | POA: Diagnosis not present

## 2019-10-16 DIAGNOSIS — Z888 Allergy status to other drugs, medicaments and biological substances status: Secondary | ICD-10-CM | POA: Diagnosis not present

## 2019-10-16 DIAGNOSIS — Z885 Allergy status to narcotic agent status: Secondary | ICD-10-CM | POA: Diagnosis not present

## 2019-10-16 DIAGNOSIS — Z886 Allergy status to analgesic agent status: Secondary | ICD-10-CM | POA: Insufficient documentation

## 2019-10-16 DIAGNOSIS — Z8249 Family history of ischemic heart disease and other diseases of the circulatory system: Secondary | ICD-10-CM | POA: Diagnosis not present

## 2019-10-16 DIAGNOSIS — Z720 Tobacco use: Secondary | ICD-10-CM

## 2019-10-16 DIAGNOSIS — Z79899 Other long term (current) drug therapy: Secondary | ICD-10-CM | POA: Diagnosis not present

## 2019-10-16 DIAGNOSIS — J431 Panlobular emphysema: Secondary | ICD-10-CM | POA: Diagnosis not present

## 2019-10-16 DIAGNOSIS — I7300 Raynaud's syndrome without gangrene: Secondary | ICD-10-CM | POA: Insufficient documentation

## 2019-10-16 NOTE — Patient Instructions (Signed)
Your physician has requested that you have an echocardiogram. Echocardiography is a painless test that uses sound waves to create images of your heart. It provides your doctor with information about the size and shape of your heart and how well your heart's chambers and valves are working. This procedure takes approximately one hour. There are no restrictions for this procedure.  Your physician has recommended that you have a pulmonary function test. Pulmonary Function Tests are a group of tests that measure how well air moves in and out of your lungs.  Please call our office to schedule a follow up appointment in May 2023

## 2019-10-16 NOTE — Progress Notes (Signed)
[facility] Consult Note   Referring Physician: PCP: Rory Percy, MD PCP-Cardiologist: No primary care provider on file.   Referring MD: Dr. Arlean Hopping   HPI:  Doris Bender is a 59 y.o. female with h/o systemic sclerosis, raynauds phenomenon, osteoporosis, rheumatoid arthritis, tobacco abuse, breast cancer (treated with lumpectomy but no chemo as she could not tolerate) and COPD/emphysema.  Seen early this 09/2017 with bilateral knee pain with chronic component. Labwork revealed RA and scleroderma. She stated she had previously been treated for pseudogout and RA "20 years ago", but had stopped her medicine.   Pt has known systemic sclerosis and was sent to Dr. Lake Bells for evaluation for lung involvement. PFTs and CT chest as below with emphysema/COPD. No evidence of ILD.   She presents today to f/u on her pulmonary hypertension screening in the setting of systemic sclerosis at the request of her Rheumatologist, Dr. Estanislado Pandy.   Overall doing fairly well. Works [employer] for [employer] Feels well. Mild DOE with going up and down steps. No change. No CP. Still smoking 0.5-1 PPD  Echo 9/19  EF 65-65% Normal RV Normal diastolic function. IVC small Personally reviewed   PFTs 11/16/17 FVC 2.48 (71%)  FEV1 1.48 (55%) TLC 5.28 (101%) DLCO 14.62 (57%)  CT Chest High Res 11/25/17 1. No evidence of interstitial lung disease. 2. Mild radiation fibrosis in the anterior left upper lobe. 3. Moderate centrilobular and mild paraseptal emphysema with mild diffuse bronchial wall thickening, compatible with the provided history of COPD. 4. One vessel coronary atherosclerosis.  Pertinent Labs 09/2017 positive ANA 1: 1280 centromere 09/2017 anticardiolipin IgM 29 (negative) 09/2017 SCL 70, RNP, Smith, SSA, SSB, dsDNA negative, lupus anticoagulant negative, beta-2 neg   Past Medical History:  Diagnosis Date  . Arthritis    lt great toe (RA)  . Breast cancer ([place])  11/15/12   left  . Cancer ([place])    breast  . Compression fracture of L4 lumbar vertebra 11/04/2012   injury  . Contact lens/glasses fitting    wears contacts or glasses  . Gout 8-9 yrs ago   Rt heel/foot  . History of radiation therapy 02/20/13-04/07/13   left breast 50.4 gray, upper outer quadrant boosted to 62.4 gray  . Osteoporosis   . Pseudogout of joint of left foot 6 yrs ago  . Raynaud's disease    bilateral hands and feet  . Wears partial dentures    partial lower    Current Outpatient Medications  Medication Sig Dispense Refill  . Acetaminophen (TYLENOL ARTHRITIS PAIN PO) Take by mouth as needed.    Marland Kitchen albuterol (VENTOLIN HFA) 108 (90 Base) MCG/ACT inhaler Inhale 2 puffs into the lungs as needed.    . ALPRAZolam (XANAX) 0.25 MG tablet Take 0.25 mg by mouth 2 (two) times daily as needed.   0  . HYDROcodone-acetaminophen (NORCO/VICODIN) 5-325 MG tablet Take 1 tablet by mouth every 6 (six) hours as needed for moderate pain. 20 tablet 0   No current facility-administered medications for this encounter.    Allergies  Allergen Reactions  . Hydroxychloroquine Nausea And Vomiting and Other (See Comments)    Back, stomach/ abd pain.  . Prednisone Nausea And Vomiting    Other reaction(s): Abdominal Pain  . Doxycycline     dizzy  . Ibuprofen Hives  . Keflex [Cephalexin] Hives  . Ketorolac Hives  . Lodine [Etodolac] Hives  . Methocarbamol Nausea Only  . Oxycodone Nausea And Vomiting  . Oxycodone Nausea  Only  . Robaxin [Methocarbamol] Hives      Social History   Socioeconomic History  . Marital status: Widowed    Spouse name: Not on file  . Number of children: Not on file  . Years of education: Not on file  . Highest education level: Not on file  Occupational History  . Not on file  Tobacco Use  . Smoking status: Current Every Day Smoker    Packs/day: 0.75    Years: 50.00    Pack years: 37.50    Types: Cigarettes  . Smokeless tobacco: Never Used  . Tobacco  comment: 1ppd or less, 37 pack year smoking history minimally  Vaping Use  . Vaping Use: Never used  Substance and Sexual Activity  . Alcohol use: Yes    Comment: rare 1 yearly  . Drug use: No  . Sexual activity: Not on file    Comment: menarche age 89, miscarriage x 1, P87,  age 28 w/birth of son, Depo-Provera x 20 yrs  Other Topics Concern  . Not on file  Social History Narrative   ** Merged History Encounter **       Social Determinants of Health   Financial Resource Strain:   . Difficulty of Paying Living Expenses:   Food Insecurity:   . Worried About Charity fundraiser in the Last Year:   . Arboriculturist in the Last Year:   Transportation Needs:   . Film/video editor (Medical):   Marland Kitchen Lack of Transportation (Non-Medical):   Physical Activity:   . Days of Exercise per Week:   . Minutes of Exercise per Session:   Stress:   . Feeling of Stress :   Social Connections:   . Frequency of Communication with Friends and Family:   . Frequency of Social Gatherings with Friends and Family:   . Attends Religious Services:   . Active Member of Clubs or Organizations:   . Attends Archivist Meetings:   Marland Kitchen Marital Status:   Intimate Partner Violence:   . Fear of Current or Ex-Partner:   . Emotionally Abused:   Marland Kitchen Physically Abused:   . Sexually Abused:       Family History  Problem Relation Age of Onset  . Hyperthyroidism Mother   . Cirrhosis Father   . Diabetes Brother   . Heart attack Maternal Grandmother   . Heart attack Maternal Grandfather     Vitals:   10/16/19 1306  BP: (!) 102/54  Pulse: 78  SpO2: 94%  Weight: 59.5 kg (131 lb 2 oz)    Wt Readings from Last 3 Encounters:  10/16/19 59.5 kg (131 lb 2 oz)  09/11/19 60.6 kg (133 lb 9.6 oz)  07/13/19 60.7 kg (133 lb 12.8 oz)    PHYSICAL EXAM: General:  Well appearing. No resp difficulty HEENT: normal Neck: supple. no JVD. Carotids 2+ bilat; no bruits. No lymphadenopathy or thryomegaly  appreciated. Cor: PMI nondisplaced. Regular rate & rhythm. No rubs, gallops or murmurs. Lungs: mild reduced breath sounds throughout  Abdomen: soft, nontender, nondistended. No hepatosplenomegaly. No bruits or masses. Good bowel sounds. Extremities: no cyanosis, clubbing, rash, edema Neuro: alert & orientedx3, cranial nerves grossly intact. moves all 4 extremities w/o difficulty. Affect pleasant   ECG: NSR 78 LPFB. No ST-T wave abnormalities.  Personally reviewed   ASSESSMENT & PLAN:  1. Pulmonary HTN screening in setting of connective tissue disease - Echo in [address] with no evidence of PAH or RV strain -  PFTs with mild to moderated decreased diffusion defect due to COPD. No evidence of ILD on hi-res CT of chst) - She is due for repeat screening. Will get PFTs and echo  2. COPD - Follows with Pulmoanry - Discussed need for smoking cessation  3. Tobacco Abuse - Encouraged smoking cessation    Glori Bickers, MD  1:45 PM

## 2019-10-26 ENCOUNTER — Other Ambulatory Visit (HOSPITAL_COMMUNITY)
Admission: RE | Admit: 2019-10-26 | Discharge: 2019-10-26 | Disposition: A | Discharge status: Home / Self Care | Payer: Commercial Managed Care - PPO | Source: Ambulatory Visit | Attending: Internal Medicine | Admitting: Internal Medicine

## 2019-10-26 ENCOUNTER — Other Ambulatory Visit: Payer: Self-pay

## 2019-10-26 ENCOUNTER — Ambulatory Visit (HOSPITAL_COMMUNITY)
Admission: RE | Admit: 2019-10-26 | Discharge: 2019-10-26 | Disposition: A | Discharge status: Home / Self Care | Payer: Commercial Managed Care - PPO | Source: Ambulatory Visit | Attending: Internal Medicine | Admitting: Internal Medicine

## 2019-10-26 DIAGNOSIS — M349 Systemic sclerosis, unspecified: Secondary | ICD-10-CM | POA: Insufficient documentation

## 2019-10-26 DIAGNOSIS — Z20822 Contact with and (suspected) exposure to covid-19: Secondary | ICD-10-CM | POA: Diagnosis not present

## 2019-10-26 LAB — SARS CORONAVIRUS 2 (TAT 6-24 HRS): SARS Coronavirus 2: NEGATIVE

## 2019-10-26 NOTE — Progress Notes (Signed)
*  PRELIMINARY RESULTS* Echocardiogram 2D Echocardiogram has been performed.  Doris Bender 10/26/2019, 9:18 AM

## 2019-10-30 ENCOUNTER — Other Ambulatory Visit: Payer: Self-pay

## 2019-10-30 ENCOUNTER — Ambulatory Visit (HOSPITAL_COMMUNITY)
Admission: RE | Admit: 2019-10-30 | Discharge: 2019-10-30 | Disposition: A | Discharge status: Home / Self Care | Payer: Commercial Managed Care - PPO | Source: Ambulatory Visit | Attending: Internal Medicine | Admitting: Internal Medicine

## 2019-10-30 DIAGNOSIS — M349 Systemic sclerosis, unspecified: Secondary | ICD-10-CM | POA: Diagnosis not present

## 2019-10-30 LAB — PULMONARY FUNCTION TEST
DL/VA % pred: 82 %
DL/VA: 3.45 ml/min/mmHg/L
DLCO unc % pred: 65 %
DLCO unc: 13.77 ml/min/mmHg
FEF 25-75 Post: 0.87 L/sec
FEF 25-75 Pre: 0.82 L/sec
FEF2575-%Change-Post: 5 %
FEF2575-%Pred-Post: 36 %
FEF2575-%Pred-Pre: 34 %
FEV1-%Change-Post: 5 %
FEV1-%Pred-Post: 62 %
FEV1-%Pred-Pre: 59 %
FEV1-Post: 1.65 L
FEV1-Pre: 1.57 L
FEV1FVC-%Change-Post: 0 %
FEV1FVC-%Pred-Pre: 78 %
FEV6-%Change-Post: 1 %
FEV6-%Pred-Post: 76 %
FEV6-%Pred-Pre: 75 %
FEV6-Post: 2.54 L
FEV6-Pre: 2.49 L
FEV6FVC-%Change-Post: -2 %
FEV6FVC-%Pred-Post: 99 %
FEV6FVC-%Pred-Pre: 101 %
FVC-%Change-Post: 4 %
FVC-%Pred-Post: 77 %
FVC-%Pred-Pre: 74 %
FVC-Post: 2.66 L
FVC-Pre: 2.55 L
Post FEV1/FVC ratio: 62 %
Post FEV6/FVC ratio: 96 %
Pre FEV1/FVC ratio: 61 %
Pre FEV6/FVC Ratio: 98 %
RV % pred: 138 %
RV: 2.81 L
TLC % pred: 103 %
TLC: 5.37 L

## 2019-10-30 MED ORDER — ALBUTEROL SULFATE (2.5 MG/3ML) 0.083% IN NEBU
2.5000 mg | INHALATION_SOLUTION | Freq: Once | RESPIRATORY_TRACT | Status: AC
  Administered 2019-10-30: 2.5 mg via RESPIRATORY_TRACT

## 2019-12-28 NOTE — Progress Notes (Deleted)
Office Visit Note  Patient: Doris Bender             Date of Birth: 24-Apr-1961           MRN: [id number]             PCP: Rory Percy, MD Referring: Rory Percy, MD Visit Date: 01/11/2020 Occupation: @GUAROCC @  Subjective:  No chief complaint on file.   History of Present Illness: Doris Bender is a 59 y.o. female ***   Activities of Daily Living:  Patient reports morning stiffness for *** {minute/hour:19697}.   Patient {ACTIONS;DENIES/REPORTS:21021675::"Denies"} nocturnal pain.  Difficulty dressing/grooming: {ACTIONS;DENIES/REPORTS:21021675::"Denies"} Difficulty climbing stairs: {ACTIONS;DENIES/REPORTS:21021675::"Denies"} Difficulty getting out of chair: {ACTIONS;DENIES/REPORTS:21021675::"Denies"} Difficulty using hands for taps, buttons, cutlery, and/or writing: {ACTIONS;DENIES/REPORTS:21021675::"Denies"}  No Rheumatology ROS completed.   PMFS History:  Patient Active Problem List   Diagnosis Date Noted  . COPD (chronic obstructive pulmonary disease) ([place]) 09/11/2019  . Smoker 09/24/2017  . Primary osteoarthritis of both hands 09/24/2017  . Autoimmune disease ([place]) 09/24/2017  . History of osteoporosis 09/13/2017  . History of vertebral compression fracture 09/13/2017  . Raynaud's syndrome without gangrene 09/13/2017  . Pseudogout 09/13/2017  . History of migraine 09/13/2017  . Left lateral epicondylitis 07/24/2015  . Back pain 07/24/2013  . Compression fracture of L4 vertebra ([facility]) 07/24/2013  . ANA positive 05/31/2013  . Osteoporosis 05/31/2013  . Raynaud phenomenon 05/31/2013  . Breast cancer ([place]) 05/30/2013  . Cancer ([place]) 05/04/2013  . Breast cancer of upper-outer quadrant of left female breast ([place]) 10/18/2012    Past Medical History:  Diagnosis Date  . Arthritis    lt great toe (RA)  . Breast cancer ([facility]) 11/15/12   left  . Cancer ([place])    breast  . Compression fracture of L4 lumbar vertebra 11/04/2012   injury  . Contact lens/glasses fitting     wears contacts or glasses  . Gout 8-9 yrs ago   Rt heel/foot  . History of radiation therapy 02/20/13-04/07/13   left breast 50.4 gray, upper outer quadrant boosted to 62.4 gray  . Osteoporosis   . Pseudogout of joint of left foot 6 yrs ago  . Raynaud's disease    bilateral hands and feet  . Wears partial dentures    partial lower    Family History  Problem Relation Age of Onset  . Hyperthyroidism Mother   . Cirrhosis Father   . Diabetes Brother   . Heart attack Maternal Grandmother   . Heart attack Maternal Grandfather    Past Surgical History:  Procedure Laterality Date  . BREAST LUMPECTOMY WITH NEEDLE LOCALIZATION AND AXILLARY SENTINEL LYMPH NODE BX Left 12/21/2012   Procedure: BREAST LUMPECTOMY WITH NEEDLE LOCALIZATION AND AXILLARY SENTINEL LYMPH NODE BX;  Surgeon: Haywood Lasso, MD;  Location: [facility];  Service: General;  Laterality: Left;  needle local BCG    . BREAST SURGERY  age 58   left breast bx- benign  . CERVICAL CONIZATION W/BX  2012   benign  . ELBOW SURGERY    . KNEE ARTHROPLASTY    . KNEE ARTHROSCOPY  2006   right  . LATERAL EPICONDYLE RELEASE  03/08/2012   Procedure: TENNIS ELBOW RELEASE;  Surgeon: Cammie Sickle., MD;  Location: [facility];  Service: Orthopedics;  Laterality: Right;  RECONSTRUCTION OF RIGHT LATERAL ELBOW WITH TENDONS  . MASS EXCISION  03/08/2012   Procedure: EXCISION MASS;  Surgeon: Cammie Sickle., MD;  Location: [facility];  Service: Orthopedics;  Laterality: Right;  EXCISION OF RIGHT WRIST DORSAL CYST  . TENNIS ELBOW RELEASE/NIRSCHEL PROCEDURE Left 09/12/2015   Procedure: ARTHROTOMY LEFT ELBOW, REPAIR EXTENSOR ORIGIN;  Surgeon: Daryll Brod, MD;  Location: [facility];  Service: Orthopedics;  Laterality: Left;  . TONSILLECTOMY    . TONSILLECTOMY     59yrs old  . WRIST GANGLION EXCISION     left x2   Social History   Social History Narrative   ** Merged History  Encounter **        There is no immunization history on file for this patient.   Objective: Vital Signs: There were no vitals taken for this visit.   Physical Exam   Musculoskeletal Exam: ***  CDAI Exam: CDAI Score: -- Patient Global: --; Provider Global: -- Swollen: --; Tender: -- Joint Exam 01/11/2020   No joint exam has been documented for this visit   There is currently no information documented on the homunculus. Go to the Rheumatology activity and complete the homunculus joint exam.  Investigation: No additional findings.  Imaging: No results found.  Recent Labs: Lab Results  Component Value Date   WBC 5.0 07/13/2019   HGB 13.4 07/13/2019   PLT 199 07/13/2019   NA 140 07/13/2019   K 4.2 07/13/2019   CL 105 07/13/2019   CO2 32 07/13/2019   GLUCOSE 95 07/13/2019   BUN 11 07/13/2019   CREATININE 0.85 07/13/2019   BILITOT 0.5 07/13/2019   ALKPHOS 83 01/05/2016   AST 13 07/13/2019   ALT 10 07/13/2019   PROT 6.4 07/13/2019   ALBUMIN 4.3 01/05/2016   CALCIUM 9.3 07/13/2019   GFRAA 86 07/13/2019    Speciality Comments: No specialty comments available.  Procedures:  No procedures performed Allergies: Hydroxychloroquine, Prednisone, Doxycycline, Ibuprofen, Keflex [cephalexin], Ketorolac, Lodine [etodolac], Methocarbamol, Oxycodone, Oxycodone, and Robaxin [methocarbamol]   Assessment / Plan:     Visit Diagnoses: No diagnosis found.  Orders: No orders of the defined types were placed in this encounter.  No orders of the defined types were placed in this encounter.   Face-to-face time spent with patient was *** minutes. Greater than 50% of time was spent in counseling and coordination of care.  Follow-Up Instructions: No follow-ups on file.   Earnestine Mealing, CMA  Note - This record has been created using Editor, commissioning.  Chart creation errors have been sought, but may not always  have been located. Such creation errors do not reflect on  the  standard of medical care.

## 2020-01-11 ENCOUNTER — Ambulatory Visit: Payer: Commercial Managed Care - PPO | Admitting: Rheumatology

## 2020-04-15 ENCOUNTER — Ambulatory Visit (INDEPENDENT_AMBULATORY_CARE_PROVIDER_SITE_OTHER): Payer: Commercial Managed Care - PPO | Admitting: Family Medicine

## 2020-04-15 ENCOUNTER — Encounter: Payer: Self-pay | Admitting: Family Medicine

## 2020-04-15 ENCOUNTER — Other Ambulatory Visit: Payer: Self-pay

## 2020-04-15 ENCOUNTER — Ambulatory Visit: Payer: Self-pay

## 2020-04-15 DIAGNOSIS — M5440 Lumbago with sciatica, unspecified side: Secondary | ICD-10-CM | POA: Diagnosis not present

## 2020-04-15 MED ORDER — BACLOFEN 10 MG PO TABS: 5.0000 mg | ORAL_TABLET | Freq: Three times a day (TID) | ORAL | 3 refills | Status: DC | PRN

## 2020-04-15 NOTE — Progress Notes (Signed)
Office Visit Note   Patient: Doris Bender           Date of Birth: 06/20/1960           MRN: [id number] Visit Date: 04/15/2020 Requested by: Rory Percy, MD [place],  [place] [address] PCP: Rory Percy, MD  Subjective: Chief Complaint  Patient presents with  . Lower Back - Pain    Woke up with this pain 04/12/20. NKI. Had a massage 04/13/20. Yesterday & today, has been having stabbing pains sometimes in the left lower back, sometimes in the right. No radiating pain down the legs. She does feel pain into her anterior pelvis. "This feels the same as when I had a compression fracture."    HPI: Patient 59 year old female.  Reports that she was at work on 04/12/2020 and was pulling out file cabinets and noticed pain in her lumbar area.  She got a massage the following day because it still felt tight.  She has been using heat packs as well as taking Tylenol arthritic pain with little relief.  Last night patient took half of a hydrocodone so that she could sleep and she was able to sleep well.  Today 12/13 the patient reports she woke up early in the morning and had to use the restroom.  When she went to get up she had a sharp stabbing pain in her lumbar area and was unable to move due to the pain.  She ended up sliding down the side of the bed and going to the restroom.  She has had significant lower back pain since then.  Denies any bowel or bladder incontinence although she does report that she was not able to make it to the restroom morning.  Denies any lower extremity numbness, tingling, weakness.              ROS:   All other systems were reviewed and are negative.  Objective: Vital Signs: There were no vitals taken for this visit.  Physical Exam:  General:  Alert and oriented, in no acute distress. Pulm:  Breathing unlabored. Psy:  Normal mood, congruent affect. Skin: No rashes appreciated MSK: Patient with diffuse lower back tenderness as well as point tenderness over SI  joints with left being worse than right.  No point tenderness over spine.  Tenderness to palpation on right ischial spine.  Pain is relieved by flexion of the back and increased with extension.  Patient is able to ambulate without difficulty other than the low back pain.  Imaging: XR Lumbar Spine 2-3 Views  Result Date: 04/15/2020 X-rays lumbar spine reveal old stable L4 compression fracture, unchanged from previous films.  No acute abnormality seen.   Assessment & Plan: Low back pain Pain is consistent with lumbar strain as well as SI joint inflammation.  No red flag symptoms.  Lumbar spine x-ray stable from previous imaging studies.  Ambulatory referral placed for physical therapy.  Prescription for baclofen sent to patient's pharmacy.  Follow-up as needed.       Procedures: No procedures performed        PMFS History: Patient Active Problem List   Diagnosis Date Noted  . COPD (chronic obstructive pulmonary disease) ([place]) 09/11/2019  . Smoker 09/24/2017  . Primary osteoarthritis of both hands 09/24/2017  . Autoimmune disease ([place]) 09/24/2017  . History of osteoporosis 09/13/2017  . History of vertebral compression fracture 09/13/2017  . Raynaud's syndrome without gangrene 09/13/2017  . Pseudogout 09/13/2017  . History  of migraine 09/13/2017  . Left lateral epicondylitis 07/24/2015  . Back pain 07/24/2013  . Compression fracture of L4 vertebra ([facility]) 07/24/2013  . ANA positive 05/31/2013  . Osteoporosis 05/31/2013  . Raynaud phenomenon 05/31/2013  . Breast cancer ([facility]) 05/30/2013  . Cancer ([facility]) 05/04/2013  . Breast cancer of upper-outer quadrant of left female breast ([facility]) 10/18/2012   Past Medical History:  Diagnosis Date  . Arthritis    lt great toe (RA)  . Breast cancer ([place]) 11/15/12   left  . Cancer ([place])    breast  . Compression fracture of L4 lumbar vertebra 11/04/2012   injury  . Contact lens/glasses fitting    wears contacts or glasses  . Gout 8-9 yrs  ago   Rt heel/foot  . History of radiation therapy 02/20/13-04/07/13   left breast 50.4 gray, upper outer quadrant boosted to 62.4 gray  . Osteoporosis   . Pseudogout of joint of left foot 6 yrs ago  . Raynaud's disease    bilateral hands and feet  . Wears partial dentures    partial lower    Family History  Problem Relation Age of Onset  . Hyperthyroidism Mother   . Cirrhosis Father   . Diabetes Brother   . Heart attack Maternal Grandmother   . Heart attack Maternal Grandfather     Past Surgical History:  Procedure Laterality Date  . BREAST LUMPECTOMY WITH NEEDLE LOCALIZATION AND AXILLARY SENTINEL LYMPH NODE BX Left 12/21/2012   Procedure: BREAST LUMPECTOMY WITH NEEDLE LOCALIZATION AND AXILLARY SENTINEL LYMPH NODE BX;  Surgeon: Haywood Lasso, MD;  Location: [facility];  Service: General;  Laterality: Left;  needle local BCG    . BREAST SURGERY  age 28   left breast bx- benign  . CERVICAL CONIZATION W/BX  2012   benign  . ELBOW SURGERY    . KNEE ARTHROPLASTY    . KNEE ARTHROSCOPY  2006   right  . LATERAL EPICONDYLE RELEASE  03/08/2012   Procedure: TENNIS ELBOW RELEASE;  Surgeon: Cammie Sickle., MD;  Location: [facility];  Service: Orthopedics;  Laterality: Right;  RECONSTRUCTION OF RIGHT LATERAL ELBOW WITH TENDONS  . MASS EXCISION  03/08/2012   Procedure: EXCISION MASS;  Surgeon: Cammie Sickle., MD;  Location: [facility];  Service: Orthopedics;  Laterality: Right;  EXCISION OF RIGHT WRIST DORSAL CYST  . TENNIS ELBOW RELEASE/NIRSCHEL PROCEDURE Left 09/12/2015   Procedure: ARTHROTOMY LEFT ELBOW, REPAIR EXTENSOR ORIGIN;  Surgeon: Daryll Brod, MD;  Location: [facility];  Service: Orthopedics;  Laterality: Left;  . TONSILLECTOMY    . TONSILLECTOMY     59yrs old  . WRIST GANGLION EXCISION     left x2

## 2020-04-15 NOTE — Progress Notes (Signed)
I saw and examined the patient with Dr. Caron Presume and agree with assessment and plan as outlined.    Acute left LBP, no injury.  Tender near left SI joint.    X-Rays do not show a new compression fracture.  Will try PT, baclofen.  MRI if fails to improve.

## 2020-04-18 ENCOUNTER — Ambulatory Visit (HOSPITAL_COMMUNITY): Payer: Commercial Managed Care - PPO | Attending: Family Medicine | Admitting: Physical Therapy

## 2020-04-18 ENCOUNTER — Other Ambulatory Visit: Payer: Self-pay

## 2020-04-18 ENCOUNTER — Encounter (HOSPITAL_COMMUNITY): Payer: Self-pay | Admitting: Physical Therapy

## 2020-04-18 DIAGNOSIS — R2689 Other abnormalities of gait and mobility: Secondary | ICD-10-CM | POA: Diagnosis present

## 2020-04-18 DIAGNOSIS — M545 Low back pain, unspecified: Secondary | ICD-10-CM | POA: Insufficient documentation

## 2020-04-18 DIAGNOSIS — R29898 Other symptoms and signs involving the musculoskeletal system: Secondary | ICD-10-CM | POA: Diagnosis present

## 2020-04-18 NOTE — Patient Instructions (Signed)
Access Code: [email or web address] URL: [email or web address] Date: 04/18/2020 Prepared by: Mitzi Hansen Lovelyn Sheeran  Exercises Standing Lumbar Extension - 2 x daily - 7 x weekly - 1 sets - 10 reps

## 2020-04-18 NOTE — Therapy (Signed)
[place], [place], [address] Phone: [phone]   Fax:  [phone]  Physical Therapy Evaluation  Patient Details  Name: Doris Bender MRN: [id number] Date of Birth: 10/24/1960 Referring Provider (PT): Eunice Blase MD   Encounter Date: 04/18/2020   PT End of Session - 04/18/20 1313    Visit Number 1    Number of Visits 1    Date for PT Re-Evaluation 04/18/20    Authorization Type Primary united [employer] (visit limit 30 PT/OT, no auth) Secondary: united [employer] ( 60 visit limit, no auth)    PT Start Time 1315    PT Stop Time 1355    PT Time Calculation (min) 40 min    Activity Tolerance Patient tolerated treatment well    Behavior During Therapy [facility] for tasks assessed/performed           Past Medical History:  Diagnosis Date   Arthritis    lt great toe (RA)   Breast cancer ([place]) 11/15/12   left   Cancer ([place])    breast   Compression fracture of L4 lumbar vertebra 11/04/2012   injury   Contact lens/glasses fitting    wears contacts or glasses   Gout 8-9 yrs ago   Rt heel/foot   History of radiation therapy 02/20/13-04/07/13   left breast 50.4 gray, upper outer quadrant boosted to 62.4 gray   Osteoporosis    Pseudogout of joint of left foot 6 yrs ago   Raynaud's disease    bilateral hands and feet   Wears partial dentures    partial lower    Past Surgical History:  Procedure Laterality Date   BREAST LUMPECTOMY WITH NEEDLE LOCALIZATION AND AXILLARY SENTINEL LYMPH NODE BX Left 12/21/2012   Procedure: BREAST LUMPECTOMY WITH NEEDLE LOCALIZATION AND AXILLARY SENTINEL LYMPH NODE BX;  Surgeon: Haywood Lasso, MD;  Location: [place];  Service: General;  Laterality: Left;  needle local BCG     BREAST SURGERY  age 1   left breast bx- benign   CERVICAL CONIZATION W/BX  2012   benign   ELBOW SURGERY     KNEE ARTHROPLASTY     KNEE ARTHROSCOPY  2006   right    LATERAL EPICONDYLE RELEASE  03/08/2012   Procedure: TENNIS ELBOW RELEASE;  Surgeon: Cammie Sickle., MD;  Location: [place];  Service: Orthopedics;  Laterality: Right;  RECONSTRUCTION OF RIGHT LATERAL ELBOW WITH TENDONS   MASS EXCISION  03/08/2012   Procedure: EXCISION MASS;  Surgeon: Cammie Sickle., MD;  Location: [place];  Service: Orthopedics;  Laterality: Right;  EXCISION OF RIGHT WRIST DORSAL CYST   TENNIS ELBOW RELEASE/NIRSCHEL PROCEDURE Left 09/12/2015   Procedure: ARTHROTOMY LEFT ELBOW, REPAIR EXTENSOR ORIGIN;  Surgeon: Daryll Brod, MD;  Location: [place];  Service: Orthopedics;  Laterality: Left;   TONSILLECTOMY     TONSILLECTOMY     59yrs old   WRIST GANGLION EXCISION     left x2    There were no vitals filed for this visit.    Subjective Assessment - 04/18/20 1315    Subjective Patient is a 59 y.o. female who presents to physical therapy with c/o LBP. Patient states last Friday morning she got up and her back felt really sore and dull ache. She felt like she was going to get a catch in her back. She went to work and it started getting worse. She took  Tylenol arthritis and used a heating pad. She then was pulling the filing cabinets and that was aggravating. She then went home for the rest of the day. She then started having sharp pains on Saturday and went for a massage which helped. Sunday she got up and had sharp pains. Monday she went to get up and she couldnt because she was in so much pain. She had to crawl to the bathroom. She was using a walker to get around. She went to MD who gave her muscle relaxers. Symptoms have been getting better with muscle relaxers. She is having some soreness in the left side in the lower part of her back. She has tender spots up her back a little more. She has not had any sharp pains today. She has been not been able to do much of her daily things. Symptoms improve with bending in a  chair and laying. Symptoms worse with certain movements. Her main goal is to decrease pain.    Pertinent History hx back pain and compression fractures    Limitations Standing;Lifting;Walking;House hold activities    Patient Stated Goals decrease pain    Currently in Pain? Yes    Pain Score 3     Pain Location Back    Pain Orientation Lower    Pain Descriptors / Indicators Aching    Pain Type Acute pain    Pain Onset In the past 7 days    Pain Frequency Constant              [facility] PT Assessment - 04/18/20 0001      Assessment   Medical Diagnosis Midline LBP with sciatica    Referring Provider (PT) Eunice Blase MD    Onset Date/Surgical Date 04/12/20    Next MD Visit none    Prior Therapy yes      Precautions   Precautions None      Restrictions   Weight Bearing Restrictions No      Balance Screen   Has the patient fallen in the past 6 months No    Has the patient had a decrease in activity level because of a fear of falling?  No    Is the patient reluctant to leave their home because of a fear of falling?  No      Prior Function   Level of Independence Independent    Vocation Full time employment      Cognition   Overall Cognitive Status Within Functional Limits for tasks assessed      Observation/Other Assessments   Observations Ambulates with trunk flexed, unsteady    Focus on Therapeutic Outcomes (FOTO)        Sensation   Light Touch Appears Intact      ROM / Strength   AROM / PROM / Strength AROM;Strength      AROM   AROM Assessment Site Lumbar    Lumbar Flexion 0% limited    Lumbar Extension 25% limited    Lumbar - Right Side Bend 0% limited    Lumbar - Left Side Bend 0% limited    Lumbar - Right Rotation 0% limited    Lumbar - Left Rotation 0% limited      Strength   Strength Assessment Site Hip;Knee;Ankle    Right/Left Hip Right;Left    Right Hip Flexion 4+/5    Left Hip Flexion 4+/5    Right/Left Knee Right;Left    Right Knee Flexion 5/5     Right Knee Extension 5/5  Left Knee Flexion 5/5    Left Knee Extension 5/5    Right/Left Ankle Right;Left    Right Ankle Dorsiflexion 5/5    Left Ankle Dorsiflexion 5/5      Palpation   Spinal mobility grossly hypomobile thoracic and lumbar spine, tender L4    Palpation comment TTP and hyperactive lumbar paraspinals, tender L QL; no tenderness in glutes                      Objective measurements completed on examination: See above findings.       [facility] Adult PT Treatment/Exercise - 04/18/20 0001      Exercises   Exercises Lumbar      Lumbar Exercises: Stretches   Standing Extension 10 reps;5 seconds    Prone on Elbows Stretch 3 reps;30 seconds                  PT Education - 04/18/20 1313    Education Details Patient educated on exam findings, POC, scope of PT, HEP, returning to phsycial therapy if needed    Person(s) Educated Patient    Methods Explanation;Demonstration;Handout    Comprehension Verbalized understanding;Returned demonstration            PT Short Term Goals - 04/18/20 1417      PT SHORT TERM GOAL #1   Title Patient will be educated on HEP and returning to PT if needed    Time 1    Period Days    Status Achieved    Target Date 04/18/20                     Plan - 04/18/20 1358    Clinical Impression Statement Patient is a 59 y.o. female who presents to physical therapy with c/o LBP. She presents with pain limited deficits in lumbar and LE strength, ROM, endurance, postural impairments, spinal mobility and functional mobility with ADL. She is having to modify and restrict ADL as indicated by subjective information and objective measures which is affecting overall participation. Patient tender to palpation in lower lumbar paraspinals and QL. Patient with low grade pain at beginning of session as she has been feeling better over last several days. Patient feeling improvement in symptoms with movement. Performed POE  with patient and attempted press ups which helped with symptoms but difficulty performing due to elbow pain. Completed standing extensions which improved ROM and symptoms. Discussed POC with patient and patient agreeable to HEP and gradually increase movement as current symptoms likely related to kinesiophobia with recent injury. Educated patient on returning to physical therapy if needed. Patient does not require additional PT services at this time.    Personal Factors and Comorbidities Age;Comorbidity 3+;Past/Current Experience;Social Background;Profession;Fitness    Comorbidities chronic LBP, history compression fracture, hx cancer    Examination-Activity Limitations Locomotion Level;Bed Mobility;Bend;Stairs    Examination-Participation Restrictions Meal Prep;Occupation;Cleaning;Yard Work;Volunteer;Shop    Stability/Clinical Decision Making Stable/Uncomplicated    Clinical Decision Making Low    PT Frequency One time visit    PT Treatment/Interventions Neuromuscular re-education;Gait training;Stair training;Functional mobility training;Therapeutic activities;Therapeutic exercise;Balance training;DME Instruction    PT Next Visit Plan n/a    PT Home Exercise Plan standing extension, gradualy increasing activity    Consulted and Agree with Plan of Care Patient           Patient will benefit from skilled therapeutic intervention in order to improve the following deficits and impairments:  Abnormal gait,Decreased mobility,Decreased strength,Postural dysfunction,Improper body  mechanics,Pain,Decreased activity tolerance  Visit Diagnosis: Low back pain, unspecified back pain laterality, unspecified chronicity, unspecified whether sciatica present  Other abnormalities of gait and mobility  Other symptoms and signs involving the musculoskeletal system     Problem List Patient Active Problem List   Diagnosis Date Noted   COPD (chronic obstructive pulmonary disease) ([place]) 09/11/2019    Smoker 09/24/2017   Primary osteoarthritis of both hands 09/24/2017   Autoimmune disease ([place]) 09/24/2017   History of osteoporosis 09/13/2017   History of vertebral compression fracture 09/13/2017   Raynaud's syndrome without gangrene 09/13/2017   Pseudogout 09/13/2017   History of migraine 09/13/2017   Left lateral epicondylitis 07/24/2015   Back pain 07/24/2013   Compression fracture of L4 vertebra ([place]) 07/24/2013   ANA positive 05/31/2013   Osteoporosis 05/31/2013   Raynaud phenomenon 05/31/2013   Breast cancer ([place]) 05/30/2013   Cancer ([place]) 05/04/2013   Breast cancer of upper-outer quadrant of left female breast ([place]) 10/18/2012    2:20 PM, 04/18/20 Mearl Latin PT, DPT Physical Therapist at [place], [place], [address] Phone: [phone]   Fax:  [phone]  Name: KAMILL FULBRIGHT MRN: [id number] Date of Birth: [birth year]

## 2020-08-20 ENCOUNTER — Other Ambulatory Visit: Payer: Self-pay | Admitting: *Deleted

## 2020-08-20 DIAGNOSIS — F17210 Nicotine dependence, cigarettes, uncomplicated: Secondary | ICD-10-CM

## 2020-09-12 ENCOUNTER — Encounter: Payer: Self-pay | Admitting: Adult Health

## 2020-09-12 ENCOUNTER — Other Ambulatory Visit: Payer: Self-pay

## 2020-09-12 ENCOUNTER — Ambulatory Visit (INDEPENDENT_AMBULATORY_CARE_PROVIDER_SITE_OTHER): Payer: Commercial Managed Care - PPO | Admitting: Adult Health

## 2020-09-12 DIAGNOSIS — J449 Chronic obstructive pulmonary disease, unspecified: Secondary | ICD-10-CM

## 2020-09-12 DIAGNOSIS — F17200 Nicotine dependence, unspecified, uncomplicated: Secondary | ICD-10-CM

## 2020-09-12 DIAGNOSIS — M359 Systemic involvement of connective tissue, unspecified: Secondary | ICD-10-CM

## 2020-09-12 NOTE — Progress Notes (Signed)
@Patient  ID: Doris Bender, female    DOB: 1960/06/29, 60 y.o.   MRN: [id number]  Chief Complaint  Patient presents with  . Follow-up    Referring provider: Rory Percy, MD  HPI: 60 year old female active smoker followed for COPD with emphysema Medical history significant for rheumatoid arthritis, scleroderma, Raynaud's syndrome Breast cancer status post XRT 2015 status post lumpectomy  TEST/EVENTS :  11/2017 HRCT> images personally reviewed showing upper lobe predominant mild centrilobular emphysema, no other pulmonary parenchymal abnormalities  August 2020 LDCT CT screening showed lung RADS 2 benign appearance.,  Emphysema.  PFT: July 2019 full pulmonary function testing showed a ratio of 59%, FEV1 1.48 L improved to 1.57 L 58% predicted, total lung capacity 5.28 L 101% predicted, residual volume 137% predict, DLCO 14.6 mL 57% predicted  Labs: 2019 positive ANA 1: 1280 centromere, anticardiolipin IgM 29, (SCL 70, RNP, Smith, SSA, SSB, dsDNA negative, lupus anticoagulant negative, beta-2 neg  09/12/2020 Follow up : COPD Patient returns for a 1 year follow-up visit for COPD with emphysema.  She says overall breathing is doing okay.  She has no increased shortness of breath or cough.  No decrease in her activity tolerance.  She has not on any maintenance inhaler.  She uses albuterol as needed. Has rare use of albuterol.   Medical history complicated by rheumatoid arthritis and scleroderma.  Previous CT chest 2019 with no evidence of interstitial lung disease.  Mild radiation fibrosis of the left upper lobe.  She does participate in the low-dose CT screening program.  Last CT chest was August 2020 with a lung RADS 2 benign appearance.  She has a upcoming CT chest Sep 17, 2020.  She does continue to smoke.  We discussed smoking cessation.  Patient is followed by rheumatology.  Is currently not on any maintenance therapy as she has had medication intolerances in the past. Has  chronic joint pain.   Going to retire , Education officer, museum.   Allergies  Allergen Reactions  . Hydroxychloroquine Nausea And Vomiting and Other (See Comments)    Back, stomach/ abd pain.  . Prednisone Nausea And Vomiting    Other reaction(s): Abdominal Pain  . Doxycycline     dizzy  . Ibuprofen Hives  . Keflex [Cephalexin] Hives  . Ketorolac Hives  . Lodine [Etodolac] Hives  . Methocarbamol Nausea Only  . Oxycodone Nausea And Vomiting  . Oxycodone Nausea Only  . Robaxin [Methocarbamol] Hives     There is no immunization history on file for this patient.  Past Medical History:  Diagnosis Date  . Arthritis    lt great toe (RA)  . Breast cancer ([place]) 11/15/12   left  . Cancer ([place])    breast  . Compression fracture of L4 lumbar vertebra 11/04/2012   injury  . Contact lens/glasses fitting    wears contacts or glasses  . Gout 8-9 yrs ago   Rt heel/foot  . History of radiation therapy 02/20/13-04/07/13   left breast 50.4 gray, upper outer quadrant boosted to 62.4 gray  . Osteoporosis   . Pseudogout of joint of left foot 6 yrs ago  . Raynaud's disease    bilateral hands and feet  . Wears partial dentures    partial lower    Tobacco History: Social History   Tobacco Use  Smoking Status Current Every Day Smoker  . Packs/day: 0.75  . Years: 50.00  . Pack years: 37.50  . Types: Cigarettes  Smokeless Tobacco Never Used  Tobacco Comment  1ppd or less, 37 pack year smoking history minimally   Ready to quit: No Counseling given: Yes Comment: 1ppd or less, 37 pack year smoking history minimally   Outpatient Medications Prior to Visit  Medication Sig Dispense Refill  . Acetaminophen (TYLENOL ARTHRITIS PAIN PO) Take by mouth as needed.    Marland Kitchen albuterol (VENTOLIN HFA) 108 (90 Base) MCG/ACT inhaler Inhale 2 puffs into the lungs as needed.    . ALPRAZolam (XANAX) 0.25 MG tablet Take 0.25 mg by mouth 2 (two) times daily as needed.   0  . HYDROcodone-acetaminophen  (NORCO/VICODIN) 5-325 MG tablet Take 1 tablet by mouth every 6 (six) hours as needed for moderate pain. 20 tablet 0  . baclofen (LIORESAL) 10 MG tablet Take 0.5-1 tablets (5-10 mg total) by mouth 3 (three) times daily as needed for muscle spasms. 30 each 3   No facility-administered medications prior to visit.     Review of Systems:   Constitutional:   No  weight loss, night sweats,  Fevers, chills, +fatigue, or  lassitude.  HEENT:   No headaches,  Difficulty swallowing,  Tooth/dental problems, or  Sore throat,                No sneezing, itching, ear ache, nasal congestion, post nasal drip,   CV:  No chest pain,  Orthopnea, PND, swelling in lower extremities, anasarca, dizziness, palpitations, syncope.   GI  No heartburn, indigestion, abdominal pain, nausea, vomiting, diarrhea, change in bowel habits, loss of appetite, bloody stools.   Resp:  .  No chest wall deformity  Skin: no rash or lesions.  GU: no dysuria, change in color of urine, no urgency or frequency.  No flank pain, no hematuria   MS:  Chronic joint pain     Physical Exam  BP 126/62   Pulse (!) 107   Ht 5\' 5"  (1.651 m)   Wt 122 lb (55.3 kg)   SpO2 95%   BMI 20.30 kg/m   GEN: A/Ox3; pleasant , NAD, well nourished    HEENT:  [place]/AT,  NOSE-clear, THROAT-clear, no lesions, no postnasal drip or exudate noted.   NECK:  Supple w/ fair ROM; no JVD; normal carotid impulses w/o bruits; no thyromegaly or nodules palpated; no lymphadenopathy.    RESP  Clear  P & A; w/o, wheezes/ rales/ or rhonchi. no accessory muscle use, no dullness to percussion  CARD:  RRR, no m/r/g, no peripheral edema, pulses intact, no cyanosis or clubbing.  GI:   Soft & nt; nml bowel sounds; no organomegaly or masses detected.   Musco: Warm bil, no deformities or joint swelling noted.   Neuro: alert, no focal deficits noted.    Skin: Warm, no lesions or rashes    Lab Results:  CBC   BNP No results found for: BNP  ProBNP No  results found for: PROBNP  Imaging: No results found.    PFT Results Latest Ref Rng & Units 10/30/2019 11/16/2017  FVC-Pre L 2.55 2.48  FVC-Predicted Pre % 74 71  FVC-Post L 2.66 2.51  FVC-Predicted Post % 77 72  Pre FEV1/FVC % % 61 59  Post FEV1/FCV % % 62 63  FEV1-Pre L 1.57 1.48  FEV1-Predicted Pre % 59 55  FEV1-Post L 1.65 1.57  DLCO uncorrected ml/min/mmHg 13.77 14.62  DLCO UNC% % 65 57  DLVA Predicted % 82 79  TLC L 5.37 5.28  TLC % Predicted % 103 101  RV % Predicted % 138 137  No results found for: NITRICOXIDE      Assessment & Plan:   COPD (chronic obstructive pulmonary disease) ([place]) Compensated on present regimen  Patient has not required maintenance inhaler.  Patient is encouraged on smoking cessation.  Continue with low-dose CT screening program  Plan  Patient Instructions  Activity as tolerated.  Follow up for Low dose CT chest screening next week.  Work on not smoking .  High protein diet .  Albuterol inhaler As needed   Follow up with Dr. Vaughan Browner or Trinidad Petron NP in 1 year and As needed        Smoker Smoking cessation discussed  Autoimmune disease ([place]) Continue follow-up with rheumatology.     Rexene Edison, NP 09/12/2020

## 2020-09-12 NOTE — Assessment & Plan Note (Signed)
Continue follow-up with rheumatology 

## 2020-09-12 NOTE — Patient Instructions (Addendum)
Activity as tolerated.  Follow up for Low dose CT chest screening next week.  Work on not smoking .  High protein diet .  Albuterol inhaler As needed   Follow up with Dr. Vaughan Browner or Henlee Donovan NP in 1 year and As needed

## 2020-09-12 NOTE — Assessment & Plan Note (Signed)
Smoking cessation discussed 

## 2020-09-12 NOTE — Assessment & Plan Note (Signed)
Compensated on present regimen  Patient has not required maintenance inhaler.  Patient is encouraged on smoking cessation.  Continue with low-dose CT screening program  Plan  Patient Instructions  Activity as tolerated.  Follow up for Low dose CT chest screening next week.  Work on not smoking .  High protein diet .  Albuterol inhaler As needed   Follow up with Dr. Vaughan Browner or Zalma Channing NP in 1 year and As needed

## 2020-09-17 ENCOUNTER — Ambulatory Visit (HOSPITAL_COMMUNITY)
Admission: RE | Admit: 2020-09-17 | Discharge: 2020-09-17 | Disposition: A | Discharge status: Home / Self Care | Payer: Commercial Managed Care - PPO | Source: Ambulatory Visit | Attending: Acute Care | Admitting: Acute Care

## 2020-09-17 ENCOUNTER — Other Ambulatory Visit: Payer: Self-pay

## 2020-09-17 DIAGNOSIS — F17210 Nicotine dependence, cigarettes, uncomplicated: Secondary | ICD-10-CM | POA: Diagnosis present

## 2020-09-20 NOTE — Progress Notes (Signed)
Please call patient and let them  know their  low dose Ct was read as a Lung RADS 2: nodules that are benign in appearance and behavior with a very low likelihood of becoming a clinically active cancer due to size or lack of growth. Recommendation per radiology is for a repeat LDCT in 12 months. .Please let them  know we will order and schedule their  annual screening scan for 09/2021. Please let them  know there was notation of CAD on their  scan.  Please remind the patient  that this is a non-gated exam therefore degree or severity of disease  cannot be determined. Please have them  follow up with their PCP regarding potential risk factor modification, dietary therapy or pharmacologic therapy if clinically indicated. Pt.  is not currently on statin therapy. Please place order for annual  screening scan for  09/2021 and fax results to PCP. Thanks so much.   Langley Gauss, have patient follow up with PCP. This shows mild CAD, and she is not on a statin. Thanks

## 2020-09-24 NOTE — Progress Notes (Signed)
Office Visit Note  Patient: Doris Bender             Date of Birth: Oct 01, 1960           MRN: [id number]             PCP: Rory Percy, MD Referring: Rory Percy, MD Visit Date: 10/08/2020 Occupation: @GUAROCC @  Subjective:  Other (Increased raynauds symptoms and pain in bilateral hands)   History of Present Illness: Doris Bender is a 60 y.o. female with a history of limited systemic sclerosis, Raynaud's and pseudogout.  She states that she continues to have pain and discomfort in her bilateral hands.  Her arthritis is getting worse including grip strength.  She has difficulty holding objects.  Her Raynauds is getting worse and she is in constant pain.  She states she has difficulty doing her job as she has difficulty pulling files.  She also tried working on the phone but she had a lot of discomfort in her hands.  She also has some discomfort in her feet.  She states she has difficulty walking.  She has difficulty going up and down the stairs.  She is unable to sit for more than an hour at a time.  She has shortness of breath on exertion.  She was recently was evaluated by pulmonary.  Her PFTs are pending.  She has not seen the cardiologist this year.  She states that she would not be able to continue to do her work due to worsening of her Raynauds, arthritis, shortness of breath and her panic attacks.  Activities of Daily Living:  Patient reports morning stiffness for 1-2 hours.   Patient Reports nocturnal pain.  Difficulty dressing/grooming: Denies Difficulty climbing stairs: Reports Difficulty getting out of chair: Denies Difficulty using hands for taps, buttons, cutlery, and/or writing: Reports  Review of Systems  Constitutional: Positive for fatigue.  HENT: Positive for mouth dryness. Negative for mouth sores and nose dryness.   Eyes: Negative for pain, itching and dryness.  Respiratory: Positive for shortness of breath, wheezing and difficulty breathing.    Cardiovascular: Negative for chest pain and palpitations.  Gastrointestinal: Negative for blood in stool, constipation and diarrhea.  Endocrine: Negative for increased urination.  Genitourinary: Negative for difficulty urinating.  Musculoskeletal: Positive for arthralgias, joint pain, joint swelling, myalgias, morning stiffness, muscle tenderness and myalgias.  Skin: Positive for color change and rash.  Allergic/Immunologic: Negative for susceptible to infections.  Neurological: Positive for numbness, headaches and weakness. Negative for dizziness and memory loss.  Hematological: Negative for bruising/bleeding tendency.  Psychiatric/Behavioral: Negative for confusion.    PMFS History:  Patient Active Problem List   Diagnosis Date Noted  . COPD (chronic obstructive pulmonary disease) ([place]) 09/11/2019  . Smoker 09/24/2017  . Primary osteoarthritis of both hands 09/24/2017  . Autoimmune disease ([place]) 09/24/2017  . History of osteoporosis 09/13/2017  . History of vertebral compression fracture 09/13/2017  . Raynaud's syndrome without gangrene 09/13/2017  . Pseudogout 09/13/2017  . History of migraine 09/13/2017  . Left lateral epicondylitis 07/24/2015  . Back pain 07/24/2013  . Compression fracture of L4 vertebra ([facility]) 07/24/2013  . ANA positive 05/31/2013  . Osteoporosis 05/31/2013  . Raynaud phenomenon 05/31/2013  . Breast cancer ([place]) 05/30/2013  . Cancer ([place]) 05/04/2013  . Breast cancer of upper-outer quadrant of left female breast ([facility]) 10/18/2012    Past Medical History:  Diagnosis Date  . Arthritis    lt great toe (RA)  . Breast cancer ([place])  11/15/12   left  . Cancer ([place])    breast  . Compression fracture of L4 lumbar vertebra 11/04/2012   injury  . Contact lens/glasses fitting    wears contacts or glasses  . Gout 8-9 yrs ago   Rt heel/foot  . History of radiation therapy 02/20/13-04/07/13   left breast 50.4 gray, upper outer quadrant boosted to 62.4 gray  .  Osteoporosis   . Pseudogout of joint of left foot 6 yrs ago  . Raynaud's disease    bilateral hands and feet  . Wears partial dentures    partial lower    Family History  Problem Relation Age of Onset  . Hyperthyroidism Mother   . Cirrhosis Father   . Diabetes Brother   . Heart attack Maternal Grandmother   . Heart attack Maternal Grandfather    Past Surgical History:  Procedure Laterality Date  . BREAST LUMPECTOMY WITH NEEDLE LOCALIZATION AND AXILLARY SENTINEL LYMPH NODE BX Left 12/21/2012   Procedure: BREAST LUMPECTOMY WITH NEEDLE LOCALIZATION AND AXILLARY SENTINEL LYMPH NODE BX;  Surgeon: Haywood Lasso, MD;  Location: [name];  Service: General;  Laterality: Left;  needle local BCG    . BREAST SURGERY  age 49   left breast bx- benign  . CERVICAL CONIZATION W/BX  2012   benign  . ELBOW SURGERY    . KNEE ARTHROPLASTY    . KNEE ARTHROSCOPY  2006   right  . LATERAL EPICONDYLE RELEASE  03/08/2012   Procedure: TENNIS ELBOW RELEASE;  Surgeon: Cammie Sickle., MD;  Location: [facility];  Service: Orthopedics;  Laterality: Right;  RECONSTRUCTION OF RIGHT LATERAL ELBOW WITH TENDONS  . MASS EXCISION  03/08/2012   Procedure: EXCISION MASS;  Surgeon: Cammie Sickle., MD;  Location: [facility];  Service: Orthopedics;  Laterality: Right;  EXCISION OF RIGHT WRIST DORSAL CYST  . TENNIS ELBOW RELEASE/NIRSCHEL PROCEDURE Left 09/12/2015   Procedure: ARTHROTOMY LEFT ELBOW, REPAIR EXTENSOR ORIGIN;  Surgeon: Daryll Brod, MD;  Location: [facility];  Service: Orthopedics;  Laterality: Left;  . TONSILLECTOMY    . TONSILLECTOMY     60yrs old  . WRIST GANGLION EXCISION     left x2   Social History   Social History Narrative   ** Merged History Encounter **        There is no immunization history on file for this patient.   Objective: Vital Signs: BP 113/74 (BP Location: Right Arm, Patient Position: Sitting, Cuff Size:  Normal)   Pulse 89   Resp 17   Ht 5\' 5"  (1.651 m)   Wt 121 lb (54.9 kg)   BMI 20.14 kg/m    Physical Exam Vitals and nursing note reviewed.  Constitutional:      Appearance: She is well-developed.  HENT:     Head: Normocephalic and atraumatic.  Eyes:     Conjunctiva/sclera: Conjunctivae normal.  Cardiovascular:     Rate and Rhythm: Normal rate and regular rhythm.     Heart sounds: Normal heart sounds.  Pulmonary:     Effort: Pulmonary effort is normal.     Breath sounds: Normal breath sounds.  Abdominal:     General: Bowel sounds are normal.     Palpations: Abdomen is soft.  Musculoskeletal:     Cervical back: Normal range of motion.  Lymphadenopathy:     Cervical: No cervical adenopathy.  Skin:    General: Skin is warm and dry.  Capillary Refill: Capillary refill takes 2 to 3 seconds.     Comments: Nailbed capillary changes and telangiectasias were noted.  Neurological:     Mental Status: She is alert and oriented to person, place, and time.  Psychiatric:        Behavior: Behavior normal.      Musculoskeletal Exam: Spine was in good range of motion.  Shoulder joints, elbow joints, wrist joints, MCPs PIPs and DIPs with good range of motion.  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  CDAI Exam: CDAI Score: -- Patient Global: --; Provider Global: -- Swollen: --; Tender: -- Joint Exam 10/08/2020   No joint exam has been documented for this visit   There is currently no information documented on the homunculus. Go to the Rheumatology activity and complete the homunculus joint exam.  Investigation: No additional findings.  Imaging: CT CHEST LUNG CA SCREEN LOW DOSE W/O CM  Result Date: 09/19/2020 CLINICAL DATA:  Lung cancer screening. Thirty-two pack-year history.Current asymptomatic smoker. EXAM: CT CHEST WITHOUT CONTRAST LOW-DOSE FOR LUNG CANCER SCREENING TECHNIQUE: Multidetector CT imaging of the chest was performed following the standard  protocol without IV contrast. COMPARISON:  12/12/2018 FINDINGS: Cardiovascular: Normal heart size. No pericardial effusion. Aortic atherosclerosis. Mild coronary artery calcifications. Mediastinum/Nodes: No enlarged mediastinal, hilar, or axillary lymph nodes. Thyroid gland, trachea, and esophagus demonstrate no significant findings. Lungs/Pleura: There is moderate centrilobular and paraseptal emphysema with diffuse bronchial wall thickening. No pleural effusion, airspace consolidation, or atelectasis. Multiple scattered lung nodules are again identified. These are not significantly changed compared with the previous exam. The largest nodular density is in the left apex with a mean derived diameter of 8.7 mm. No suspicious lung nodules identified at this time. Upper Abdomen: No acute abnormality. Musculoskeletal: No chest wall mass or suspicious bone lesions identified. IMPRESSION: 1. Lung-RADS 2, benign appearance or behavior. Continue annual screening with low-dose chest CT without contrast in 12 months. 2. Mild coronary artery calcifications. Aortic Atherosclerosis (ICD10-I70.0) and Emphysema (ICD10-J43.9). Electronically Signed   By: Kerby Moors M.D.   On: 09/19/2020 11:06    Recent Labs: Lab Results  Component Value Date   WBC 5.0 07/13/2019   HGB 13.4 07/13/2019   PLT 199 07/13/2019   NA 140 07/13/2019   K 4.2 07/13/2019   CL 105 07/13/2019   CO2 32 07/13/2019   GLUCOSE 95 07/13/2019   BUN 11 07/13/2019   CREATININE 0.85 07/13/2019   BILITOT 0.5 07/13/2019   ALKPHOS 83 01/05/2016   AST 13 07/13/2019   ALT 10 07/13/2019   PROT 6.4 07/13/2019   ALBUMIN 4.3 01/05/2016   CALCIUM 9.3 07/13/2019   GFRAA 86 07/13/2019    Speciality Comments: No specialty comments available.  Procedures:  No procedures performed Allergies: Hydroxychloroquine, Prednisone, Doxycycline, Ibuprofen, Keflex [cephalexin], Ketorolac, Lodine [etodolac], Methocarbamol, Oxycodone, Oxycodone, and Robaxin  [methocarbamol]   Assessment / Plan:     Visit Diagnoses: Scleroderma ([place]) - Limited systemic sclerosis hx of raynaud's, sclerodactyly Telengectesia's ANA 1:1280 centromere, anticardiolipin IgM 29,  (SCL 70, RNP, smith, [place], [place], dsDNA neg -she has been having increased pain and discomfort in her hands due to scleroderma and arthritis.  She also has severe Raynauds which causes discomfort.  Plan: CBC with Differential/Platelet, COMPLETE METABOLIC PANEL WITH GFR.  She is applying for disability due to ongoing pain and discomfort in her hands and severe Raynauds.  I advised her to schedule an appointment with Dr. Haroldine Laws for yearly echocardiogram.  She will need repeat PFTs  in June as well. Patient is applying for disability.  She states that she will not be able to continue to work anymore.  She brought the disability paperwork which was filled today to the best of my knowledge.  A work excuse note was also given today.  Raynaud's disease without gangrene -Raynauds is getting worse.  Smoking cessation was discussed at length patient states that she has been under a lot of stress but she will start working on the smoking cessation.5.  I also discussed use of amlodipine 5 mg p.o. daily which may help her with the Raynauds symptoms.  She is hesitant to go on medications which can lower her blood pressure.  She states she had severe hypotensive episode with the Prolia injection in the past.  Plan: Cardiolipin antibodies, IgG, IgM, IgA  Primary osteoarthritis of both hands-she has underlying osteoarthritis in her hands which causes ongoing discomfort.  Pseudogout - Diagnosed by Dr. Jerene Pitch in the past.  She has not had any episodes of pseudogout.  Smoker - 3/4 PPD x 35 years.  Smoking cessation was discussed at length.  She had recent CT chest for cancer screening.  History of osteoporosis - Declined tx.  I do not have a recent bone density on her.  She states that she was diagnosed with osteoporosis in the  past and was given Prolia injection.  She was in observation for a long time as her blood pressure dropped.  History of vertebral compression fracture  History of migraine-she continues to have migraines.  Malignant neoplasm of upper-outer quadrant of left female breast, unspecified estrogen receptor status ([place])  Anxiety-she states her anxiety is getting worse.  She has been having panic attacks.  Which is also interfering with her work.  Orders: Orders Placed This Encounter  Procedures  . CBC with Differential/Platelet  . COMPLETE METABOLIC PANEL WITH GFR  . Cardiolipin antibodies, IgG, IgM, IgA   No orders of the defined types were placed in this encounter.   Face-to-face time spent with patient was 40 minutes. Greater than 50% of time was spent in counseling and coordination of care.  Follow-Up Instructions: Return in about 4 months (around 02/07/2021) for Scleroderma.   Bo Merino, MD  Note - This record has been created using Editor, commissioning.  Chart creation errors have been sought, but may not always  have been located. Such creation errors do not reflect on  the standard of medical care.

## 2020-09-26 ENCOUNTER — Encounter: Payer: Self-pay | Admitting: *Deleted

## 2020-09-26 DIAGNOSIS — F17210 Nicotine dependence, cigarettes, uncomplicated: Secondary | ICD-10-CM

## 2020-10-08 ENCOUNTER — Telehealth: Payer: Self-pay

## 2020-10-08 ENCOUNTER — Other Ambulatory Visit: Payer: Self-pay

## 2020-10-08 ENCOUNTER — Ambulatory Visit (INDEPENDENT_AMBULATORY_CARE_PROVIDER_SITE_OTHER): Payer: Commercial Managed Care - PPO | Admitting: Rheumatology

## 2020-10-08 ENCOUNTER — Encounter: Payer: Self-pay | Admitting: Rheumatology

## 2020-10-08 VITALS — BP 113/74 | HR 89 | Resp 17 | Ht 65.0 in | Wt 121.0 lb

## 2020-10-08 DIAGNOSIS — M19041 Primary osteoarthritis, right hand: Secondary | ICD-10-CM

## 2020-10-08 DIAGNOSIS — M349 Systemic sclerosis, unspecified: Secondary | ICD-10-CM

## 2020-10-08 DIAGNOSIS — Z8669 Personal history of other diseases of the nervous system and sense organs: Secondary | ICD-10-CM

## 2020-10-08 DIAGNOSIS — M1120 Other chondrocalcinosis, unspecified site: Secondary | ICD-10-CM | POA: Diagnosis not present

## 2020-10-08 DIAGNOSIS — I7300 Raynaud's syndrome without gangrene: Secondary | ICD-10-CM | POA: Diagnosis not present

## 2020-10-08 DIAGNOSIS — M19042 Primary osteoarthritis, left hand: Secondary | ICD-10-CM

## 2020-10-08 DIAGNOSIS — C50412 Malignant neoplasm of upper-outer quadrant of left female breast: Secondary | ICD-10-CM

## 2020-10-08 DIAGNOSIS — Z8739 Personal history of other diseases of the musculoskeletal system and connective tissue: Secondary | ICD-10-CM

## 2020-10-08 DIAGNOSIS — Z8781 Personal history of (healed) traumatic fracture: Secondary | ICD-10-CM

## 2020-10-08 DIAGNOSIS — F419 Anxiety disorder, unspecified: Secondary | ICD-10-CM

## 2020-10-08 DIAGNOSIS — F17200 Nicotine dependence, unspecified, uncomplicated: Secondary | ICD-10-CM

## 2020-10-08 NOTE — Telephone Encounter (Signed)
Patient called stating she missed a call from our office.  Patient states it might be regarding her disability paperwork.

## 2020-10-08 NOTE — Telephone Encounter (Signed)
Advised patient that disability paperwork and ALL office notes have been faxed to number on paperwork. A copy has been made for the patient and is at the front desk ready to be picked up. Patient verbalized understanding and will pick up her copy.   Original copy has been sent to the scan center and a copy has been placed in the pending folder at [employer]. Confirmation received that fax was sent.   Provided patient with copy of disability paperwork and a copy of all office notes that were faxed.

## 2020-10-08 NOTE — Patient Instructions (Addendum)
Please schedule an appointment with Dr. Haroldine Laws. [phone]  Please a schedule PFTs with pulmonary.  Vaccines  The following immunizations are recommended: . Flu annually . Covid-19  . Td/Tdap (tetanus, diphtheria, pertussis) every 10 years . Pneumonia (Prevnar 15 then Pneumovax 23 at least 1 year apart.  Alternatively, can take Prevnar 20 without needing additional dose) . Shingrix (after age 62): 2 doses from 4 weeks to 6 months apart  Please check with your PCP to make sure you are up to date.

## 2020-10-08 NOTE — Telephone Encounter (Signed)
Patient advised we have not tried to reach out to her at this time. Patient advised her paperwork has not been completed at this time. Patient advised we are working on it and will call her once it has been completed.

## 2020-10-09 LAB — CBC WITH DIFFERENTIAL/PLATELET
Absolute Monocytes: 398 cells/uL (ref 200–950)
Basophils Absolute: 28 cells/uL (ref 0–200)
Basophils Relative: 0.5 %
Eosinophils Absolute: 101 cells/uL (ref 15–500)
Eosinophils Relative: 1.8 %
HCT: 42.9 % (ref 35.0–45.0)
Hemoglobin: 14.3 g/dL (ref 11.7–15.5)
Lymphs Abs: 1165 cells/uL (ref 850–3900)
MCH: 30.4 pg (ref 27.0–33.0)
MCHC: 33.3 g/dL (ref 32.0–36.0)
MCV: 91.1 fL (ref 80.0–100.0)
MPV: 9.3 fL (ref 7.5–12.5)
Monocytes Relative: 7.1 %
Neutro Abs: 3909 cells/uL (ref 1500–7800)
Neutrophils Relative %: 69.8 %
Platelets: 211 10*3/uL (ref 140–400)
RBC: 4.71 10*6/uL (ref 3.80–5.10)
RDW: 12.3 % (ref 11.0–15.0)
Total Lymphocyte: 20.8 %
WBC: 5.6 10*3/uL (ref 3.8–10.8)

## 2020-10-09 LAB — COMPLETE METABOLIC PANEL WITH GFR
AG Ratio: 2.2 (calc) (ref 1.0–2.5)
ALT: 9 U/L (ref 6–29)
AST: 13 U/L (ref 10–35)
Albumin: 4.6 g/dL (ref 3.6–5.1)
Alkaline phosphatase (APISO): 89 U/L (ref 37–153)
BUN: 12 mg/dL (ref 7–25)
CO2: 30 mmol/L (ref 20–32)
Calcium: 9.6 mg/dL (ref 8.6–10.4)
Chloride: 103 mmol/L (ref 98–110)
Creat: 0.82 mg/dL (ref 0.50–0.99)
GFR, Est African American: 90 mL/min/{1.73_m2} (ref >=60)
GFR, Est Non African American: 77 mL/min/{1.73_m2} (ref >=60)
Globulin: 2.1 g/dL (calc) (ref 1.9–3.7)
Glucose, Bld: 100 mg/dL — ABNORMAL HIGH (ref 65–99)
Potassium: 5.1 mmol/L (ref 3.5–5.3)
Sodium: 140 mmol/L (ref 135–146)
Total Bilirubin: 0.6 mg/dL (ref 0.2–1.2)
Total Protein: 6.7 g/dL (ref 6.1–8.1)

## 2020-10-09 LAB — CARDIOLIPIN ANTIBODIES, IGG, IGM, IGA
Anticardiolipin IgA: 3.4 APL-U/mL
Anticardiolipin IgG: <2 GPL-U/mL
Anticardiolipin IgM: 10.3 MPL-U/mL

## 2020-10-09 NOTE — Progress Notes (Signed)
Anticardiolipin antibodies are negative.

## 2020-10-16 ENCOUNTER — Telehealth: Payer: Self-pay

## 2020-10-16 ENCOUNTER — Telehealth: Payer: Self-pay | Admitting: Adult Health

## 2020-10-16 DIAGNOSIS — R0609 Other forms of dyspnea: Secondary | ICD-10-CM

## 2020-10-16 NOTE — Telephone Encounter (Addendum)
Called and spoke with patient who states that she got a phone call from [place] this morning asking her to call back. Asked Tammy if she has reached out to patient and she said yes , can you tell her Dr. Estil Daft wants her to have a PFT for her dyspnea she complained about at the [facility] with Rheumatology . there is a patient adivse message to [employer].    Called patient back to let her know of message from [place]. Patient expressed understanding. Patient lives in [place] and would like to do PFT at [facility] if possible. Order has been placed for AP. Advised patient she would get a call to get it scheduled. She is also now scheduled for a new patient appointment with Dr. Vaughan Browner on 11/28/20.  Nothing further needed at this time.

## 2020-10-16 NOTE — Telephone Encounter (Signed)
-----   Message from Vanessa Barbara, RN sent at 10/16/2020 12:05 PM EDT ----- Regarding: PFT/f/u with [facility] or PT  ----- Message ----- From: Melvenia Needles, NP Sent: 10/16/2020   9:12 AM EDT To: Bo Merino, MD, #  Hi there thanks for the update. I can reach out to her to set up PFTs.  Last PFT was <1 yr ago and recent CT chest was stable . With active smoking and CTD may be progressing .   Heather, I called patient to set up full PFT , left message to call back. Please reach out to her to have set up PFT and make sure she has follow up with Dr. Vaughan Browner  or Myself .  Recent ov with Rheumatology with c/o of increased DOE.   Thanks  Tammy    ----- Message ----- From: Bo Merino, MD Sent: 10/08/2020  11:55 AM EDT To: Melvenia Needles, NP  Tammy,  I reviewed your recent note on [place].  She complains of increased shortness of breath.  Please,review if she needs to get the PFTs.  I will also advise her to schedule an appointment with Dr. Haroldine Laws.  Thank you, Bo Merino, MD

## 2020-10-16 NOTE — Telephone Encounter (Signed)
Called and spoke with patient who states that she got a phone call from [place] this morning asking her to call back. Asked Tammy if she has reached out to patient and she said yes , can you tell her Dr. Estil Daft wants her to have a PFT for her dyspnea she complained about at the [facility] with Rheumatology . there is a patient adivse message to [employer].   Called patient back to let her know of message from [place]. Patient expressed understanding. Patient lives in [place] and would like to do PFT at [facility] if possible. Order has been placed for AP. Advised patient she would get a call to get it scheduled. Nothing further needed at this time.

## 2020-11-28 ENCOUNTER — Other Ambulatory Visit: Payer: Self-pay

## 2020-11-28 ENCOUNTER — Encounter: Payer: Self-pay | Admitting: Pulmonary Disease

## 2020-11-28 ENCOUNTER — Ambulatory Visit (INDEPENDENT_AMBULATORY_CARE_PROVIDER_SITE_OTHER): Payer: Commercial Managed Care - PPO | Admitting: Pulmonary Disease

## 2020-11-28 VITALS — BP 106/70 | HR 84 | Ht 65.0 in | Wt 121.4 lb

## 2020-11-28 DIAGNOSIS — F17210 Nicotine dependence, cigarettes, uncomplicated: Secondary | ICD-10-CM

## 2020-11-28 DIAGNOSIS — R0609 Other forms of dyspnea: Secondary | ICD-10-CM

## 2020-11-28 DIAGNOSIS — J449 Chronic obstructive pulmonary disease, unspecified: Secondary | ICD-10-CM | POA: Diagnosis not present

## 2020-11-28 DIAGNOSIS — R0600 Dyspnea, unspecified: Secondary | ICD-10-CM | POA: Diagnosis not present

## 2020-11-28 DIAGNOSIS — M359 Systemic involvement of connective tissue, unspecified: Secondary | ICD-10-CM

## 2020-11-28 NOTE — Patient Instructions (Signed)
I am glad you are doing well with regard to your breathing Continue with albuterol as needed Work on smoking cessation  We will schedule PFTs and follow-up in clinic in 3 months

## 2020-11-28 NOTE — Progress Notes (Signed)
Doris Bender    LL:[id number]    1960-06-03  Primary Care Physician:Howard, Lennette Bihari, MD  Referring Physician: Rory Percy, MD [place],  [place] [id number]  Chief complaint: Consult for dyspnea, COPD, evaluation for interstitial lung disease  HPI: Active smoker with history of limited systemic sclerosis, Raynaud's, pseudogout, COPD, breast cancer Previously followed by Dr. Lake Bells and last seen [place] with Dr. Estanislado Pandy.  On amlodipine for Raynaud's syndrome.  Not on any immunosuppressive therapy She has previously been on steroids and Plaquenil which was stopped due to side effects Has annual echocardiogram and follow-up with Dr. Haroldine Laws.  No evidence of pulmonary hypertension on prior studies.  Denies any dyspnea, she is on albuterol which she uses very rarely No cough, sputum production, fevers, chills  PFTs were ordered this year at [facility] but she did not want to get them done as there was a requirement for COVID testing  Pets: Outside dog Occupation: Retired Education officer, museum Exposures: Used to have a down pillow until 2021.  No ongoing exposures.  No mold, hot tub, Jacuzzi.  No feather pillows or comforters Smoking history: 40-pack-year smoker.  Continues to smoke half pack per day Travel history: No significant travel history Relevant family history: No family history of lung disease  Outpatient Encounter Medications as of 11/28/2020  Medication Sig   Acetaminophen (TYLENOL ARTHRITIS PAIN PO) Take by mouth as needed.   albuterol (VENTOLIN HFA) 108 (90 Base) MCG/ACT inhaler Inhale 2 puffs into the lungs as needed.   ALPRAZolam (XANAX) 0.25 MG tablet Take 0.25 mg by mouth 2 (two) times daily as needed.    HYDROcodone-acetaminophen (NORCO/VICODIN) 5-325 MG tablet Take 1 tablet by mouth every 6 (six) hours as needed for moderate pain.   No facility-administered encounter medications on file as of 11/28/2020.    Allergies as of 11/28/2020 -  Review Complete 11/28/2020  Allergen Reaction Noted   Hydroxychloroquine Nausea And Vomiting and Other (See Comments) 06/15/2016   Prednisone Nausea And Vomiting 06/24/2016   Doxycycline  09/13/2017   Ibuprofen Hives 03/04/2012   Keflex [cephalexin] Hives 03/04/2012   Ketorolac Hives 03/04/2012   Lodine [etodolac] Hives 03/04/2012   Methocarbamol Nausea Only 06/16/2015   Oxycodone Nausea And Vomiting 11/04/2012   Oxycodone Nausea Only 06/16/2015   Robaxin [methocarbamol] Hives 11/11/2012    Past Medical History:  Diagnosis Date   Arthritis    lt great toe (RA)   Breast cancer ([place]) 11/15/12   left   Cancer ([place])    breast   Compression fracture of L4 lumbar vertebra 11/04/2012   injury   Contact lens/glasses fitting    wears contacts or glasses   Gout 8-9 yrs ago   Rt heel/foot   History of radiation therapy 02/20/13-04/07/13   left breast 50.4 gray, upper outer quadrant boosted to 62.4 gray   Osteoporosis    Pseudogout of joint of left foot 6 yrs ago   Raynaud's disease    bilateral hands and feet   Wears partial dentures    partial lower    Past Surgical History:  Procedure Laterality Date   BREAST LUMPECTOMY WITH NEEDLE LOCALIZATION AND AXILLARY SENTINEL LYMPH NODE BX Left 12/21/2012   Procedure: BREAST LUMPECTOMY WITH NEEDLE LOCALIZATION AND AXILLARY SENTINEL LYMPH NODE BX;  Surgeon: Haywood Lasso, MD;  Location: [facility];  Service: General;  Laterality: Left;  needle local BCG     BREAST SURGERY  age 60   left breast bx- benign   CERVICAL CONIZATION W/BX  2012   benign   ELBOW SURGERY     KNEE ARTHROPLASTY     KNEE ARTHROSCOPY  2006   right   LATERAL EPICONDYLE RELEASE  03/08/2012   Procedure: TENNIS ELBOW RELEASE;  Surgeon: Cammie Sickle., MD;  Location: [facility];  Service: Orthopedics;  Laterality: Right;  RECONSTRUCTION OF RIGHT LATERAL ELBOW WITH TENDONS   MASS EXCISION  03/08/2012   Procedure: EXCISION MASS;   Surgeon: Cammie Sickle., MD;  Location: [facility];  Service: Orthopedics;  Laterality: Right;  EXCISION OF RIGHT WRIST DORSAL CYST   TENNIS ELBOW RELEASE/NIRSCHEL PROCEDURE Left 09/12/2015   Procedure: ARTHROTOMY LEFT ELBOW, REPAIR EXTENSOR ORIGIN;  Surgeon: Daryll Brod, MD;  Location: [facility];  Service: Orthopedics;  Laterality: Left;   TONSILLECTOMY     TONSILLECTOMY     60yr old   WRIST GANGLION EXCISION     left x2    Family History  Problem Relation Age of Onset   Hyperthyroidism Mother    Cirrhosis Father    Diabetes Brother    Heart attack Maternal Grandmother    Heart attack Maternal Grandfather     Social History   Socioeconomic History   Marital status: Widowed    Spouse name: Not on file   Number of children: Not on file   Years of education: Not on file   Highest education level: Not on file  Occupational History   Not on file  Tobacco Use   Smoking status: Every Day    Packs/day: 0.75    Years: 50.00    Pack years: 37.50    Types: Cigarettes   Smokeless tobacco: Never   Tobacco comments:    10-12 cig per day- 37 pack year smoking history minimally  Vaping Use   Vaping Use: Never used  Substance and Sexual Activity   Alcohol use: Yes    Comment: rare 1 yearly   Drug use: No   Sexual activity: Not on file    Comment: menarche age 60 miscarriage x 1, P1,  age [age]w/birth of son, Depo-Provera x 20 yrs  Other Topics Concern   Not on file  Social History Narrative   ** Merged History Encounter **       Social Determinants of Health   Financial Resource Strain: Not on file  Food Insecurity: Not on file  Transportation Needs: Not on file  Physical Activity: Not on file  Stress: Not on file  Social Connections: Not on file  Intimate Partner Violence: Not on file    Review of systems: Review of Systems  Constitutional: Negative for fever and chills.  HENT: Negative.   Eyes: Negative for blurred vision.   Respiratory: as per HPI  Cardiovascular: Negative for chest pain and palpitations.  Gastrointestinal: Negative for vomiting, diarrhea, blood per rectum. Genitourinary: Negative for dysuria, urgency, frequency and hematuria.  Musculoskeletal: Negative for myalgias, back pain and joint pain.  Skin: Negative for itching and rash.  Neurological: Negative for dizziness, tremors, focal weakness, seizures and loss of consciousness.  Endo/Heme/Allergies: Negative for environmental allergies.  Psychiatric/Behavioral: Negative for depression, suicidal ideas and hallucinations.  All other systems reviewed and are negative.  Physical Exam: Blood pressure 106/70, pulse 84, height '5\' 5"'$  (1.651 m), weight 121 lb 6.4 oz (55.1 kg), SpO2 96 %. Gen:      No acute distress HEENT:  EOMI,  sclera anicteric Neck:     No masses; no thyromegaly Lungs:    Clear to auscultation bilaterally; normal respiratory effort CV:         Regular rate and rhythm; no murmurs Abd:      + bowel sounds; soft, non-tender; no palpable masses, no distension Ext:    No edema; adequate peripheral perfusion Skin:      Warm and dry; no rash Neuro: alert and oriented x 3 Psych: normal mood and affect  Data Reviewed: Imaging: CT high-resolution 11/25/2017-no interstitial lung disease, radiation fibrosis to the left upper lobe, moderate emphysema. Screening CT chest 09/17/2020-stable lung nodules, no interstitial lung disease I have reviewed the images personally.  PFTs: FVC 2.66 [77%], FEV1 1.65 [62%], F/F 62, TLC 5.37 [103%], DLCO 13.77 [65%] Moderate-severe obstructive airway disease, air trapping, mild diffusion defect  Labs:  Cardiac: Echocardiogram 10/26/2019-LVEF 60 to 65%, normal PA systolic pressure  Assessment:  Evaluation for interstitial lung disease She has history of limited sclerosis, Raynaud's Prior high-res CT and recent screening CT in May 2022 reviewed with no evidence of interstitial lung disease Continue  monitoring Repeat PFTs  COPD Has moderate COPD on PFTs She does not want any controller medication as she is asymptomatic, continue albuterol as needed  Active smoker Smoking cessation discussed.  She is going through some stressors including diabetes but plans on quitting soon.  She does not want a referral for smoking cessation or prescription for nicotine, Chantix Reassess at return visit.  Time spent counseling-5 minutes  Plan/Recommendations: PFTs Continue albuterol as needed Follow-up in 3 months.  Marshell Garfinkel MD [facility] 11/28/2020, 11:41 AM  CC: Rory Percy, MD

## 2021-01-21 ENCOUNTER — Telehealth: Payer: Self-pay | Admitting: Pulmonary Disease

## 2021-01-21 NOTE — Progress Notes (Signed)
Office Visit Note  Patient: Doris Bender             Date of Birth: Feb 10, 1961           MRN: [id number]             PCP: Rory Percy, MD Referring: Rory Percy, MD Visit Date: 02/04/2021 Occupation: @GUAROCC @  Subjective:  Increased pain in both hands and both feet   History of Present Illness: Doris Bender is a 60 y.o. female with history of scleroderma and osteoarthritis.  She is not currently taking any immunosuppressive agents.  Patient presents she has been experiencing increased pain and inflammation in both hands and both feet.  She states that she retired November 01, 2020 and has been performing more household activities as well as yard work which she feels has exacerbated her joint pain.  She is also experiencing intermittent pain in both knee joints.  She has been taking Tylenol as needed for pain relief.  She states that she has had increased fatigue on a daily basis as well.  She states that she had a massage about 2 weeks ago and noticed some improvement in her arthralgias.  She states that she has intermittent symptoms of Raynaud's especially with the colder weather temperatures.  She is no longer taking amlodipine.  She denies any digital ulcerations or signs of gangrene.  She has not noticed any increase skin tightness or thickening.  She has not had any recent rashes. She denies any reflux, constipation, or dysphagia. She has not noticed any increased shortness of breath, coughing, or pleuritic chest pain.  She has an upcoming appointment with Dr. Vaughan Browner on 02/28/21.  She needs to schedule a follow visit with Dr. Haroldine Laws and to update echo.      Activities of Daily Living:  Patient reports morning stiffness for 1-2 hours.   Patient Reports nocturnal pain.  Difficulty dressing/grooming: Denies Difficulty climbing stairs: Denies Difficulty getting out of chair: Denies Difficulty using hands for taps, buttons, cutlery, and/or writing: Reports  Review of  Systems  Constitutional:  Positive for fatigue.  HENT:  Positive for mouth dryness. Negative for mouth sores and nose dryness.   Eyes:  Negative for pain, itching, visual disturbance and dryness.  Respiratory:  Positive for shortness of breath. Negative for cough, hemoptysis and difficulty breathing.   Cardiovascular:  Negative for chest pain, palpitations, hypertension and swelling in legs/feet.  Gastrointestinal:  Negative for blood in stool, constipation and diarrhea.  Endocrine: Negative for increased urination.  Genitourinary:  Negative for difficulty urinating and painful urination.  Musculoskeletal:  Positive for joint pain, joint pain, joint swelling, myalgias, morning stiffness, muscle tenderness and myalgias. Negative for muscle weakness.  Skin:  Positive for color change. Negative for pallor, rash, hair loss, nodules/bumps, redness, skin tightness, ulcers and sensitivity to sunlight.  Allergic/Immunologic: Negative for susceptible to infections.  Neurological:  Positive for numbness. Negative for dizziness, headaches, memory loss and weakness.  Hematological:  Negative for bruising/bleeding tendency and swollen glands.  Psychiatric/Behavioral:  Negative for depressed mood, confusion and sleep disturbance. The patient is not nervous/anxious.    PMFS History:  Patient Active Problem List   Diagnosis Date Noted   COPD (chronic obstructive pulmonary disease) ([place]) 09/11/2019   Smoker 09/24/2017   Primary osteoarthritis of both hands 09/24/2017   Autoimmune disease ([place]) 09/24/2017   History of osteoporosis 09/13/2017   History of vertebral compression fracture 09/13/2017   Raynaud's syndrome without gangrene 09/13/2017   Pseudogout  09/13/2017   History of migraine 09/13/2017   Left lateral epicondylitis 07/24/2015   Back pain 07/24/2013   Compression fracture of L4 vertebra ([facility]) 07/24/2013   ANA positive 05/31/2013   Osteoporosis 05/31/2013   Raynaud phenomenon 05/31/2013    Breast cancer ([facility]) 05/30/2013   Cancer ([facility]) 05/04/2013   Breast cancer of upper-outer quadrant of left female breast ([facility]) 10/18/2012    Past Medical History:  Diagnosis Date   Arthritis    lt great toe (RA)   Breast cancer ([facility]) 11/15/12   left   Cancer ([facility])    breast   Compression fracture of L4 lumbar vertebra 11/04/2012   injury   Contact lens/glasses fitting    wears contacts or glasses   Gout 8-9 yrs ago   Rt heel/foot   History of radiation therapy 02/20/13-04/07/13   left breast 50.4 gray, upper outer quadrant boosted to 62.4 gray   Osteoporosis    Pseudogout of joint of left foot 6 yrs ago   Raynaud's disease    bilateral hands and feet   Wears partial dentures    partial lower    Family History  Problem Relation Age of Onset   Hyperthyroidism Mother    Cirrhosis Father    Diabetes Brother    Heart attack Maternal Grandmother    Heart attack Maternal Grandfather    Past Surgical History:  Procedure Laterality Date   BREAST LUMPECTOMY WITH NEEDLE LOCALIZATION AND AXILLARY SENTINEL LYMPH NODE BX Left 12/21/2012   Procedure: BREAST LUMPECTOMY WITH NEEDLE LOCALIZATION AND AXILLARY SENTINEL LYMPH NODE BX;  Surgeon: Haywood Lasso, MD;  Location: [facility];  Service: General;  Laterality: Left;  needle local BCG     BREAST SURGERY  age 48   left breast bx- benign   CERVICAL CONIZATION W/BX  2012   benign   ELBOW SURGERY     KNEE ARTHROPLASTY     KNEE ARTHROSCOPY  2006   right   LATERAL EPICONDYLE RELEASE  03/08/2012   Procedure: TENNIS ELBOW RELEASE;  Surgeon: Cammie Sickle., MD;  Location: [facility];  Service: Orthopedics;  Laterality: Right;  RECONSTRUCTION OF RIGHT LATERAL ELBOW WITH TENDONS   MASS EXCISION  03/08/2012   Procedure: EXCISION MASS;  Surgeon: Cammie Sickle., MD;  Location: [facility];  Service: Orthopedics;  Laterality: Right;  EXCISION OF RIGHT WRIST DORSAL CYST   TENNIS ELBOW  RELEASE/NIRSCHEL PROCEDURE Left 09/12/2015   Procedure: ARTHROTOMY LEFT ELBOW, REPAIR EXTENSOR ORIGIN;  Surgeon: Daryll Brod, MD;  Location: [facility];  Service: Orthopedics;  Laterality: Left;   TONSILLECTOMY     TONSILLECTOMY     60yrs old   WRIST GANGLION EXCISION     left x2   Social History   Social History Narrative   ** Merged History Encounter **        There is no immunization history on file for this patient.   Objective: Vital Signs: BP 121/78 (BP Location: Right Arm, Patient Position: Sitting, Cuff Size: Normal)   Pulse 94   Ht 5\' 5"  (1.651 m)   Wt 124 lb 9.6 oz (56.5 kg)   BMI 20.73 kg/m    Physical Exam Vitals and nursing note reviewed.  Constitutional:      Appearance: She is well-developed.  HENT:     Head: Normocephalic and atraumatic.  Eyes:     Conjunctiva/sclera: Conjunctivae normal.  Pulmonary:     Effort: Pulmonary effort is  normal.  Abdominal:     Palpations: Abdomen is soft.  Musculoskeletal:     Cervical back: Normal range of motion.  Skin:    General: Skin is warm and dry.     Capillary Refill: Capillary refill takes 2 to 3 seconds.     Comments: Sclerodactyly unchanged.   Nailbed capillary changes noted.  No digital ulcerations or signs of gangrene.  Scattered telangectasia noted, especially on palmar aspect of both hands.  Neurological:     Mental Status: She is alert and oriented to person, place, and time.  Psychiatric:        Behavior: Behavior normal.     Musculoskeletal Exam: C-spine has good ROM with no discomfort.  Shoulder joints, elbow joints, and wrist joints have good ROM with no discomfort.  Tenderness over several MCPs and PIPs as described above. 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  Hip joints have good ROM with no discomfort.  Knee joints have good ROM with no warmth or effusion.  Ankle joints have good ROM with no tenderness or joint swelling.  Tenderness over MTP joints, especially the left 1st  MTP.  No evidence of achilles tendonitis or plantar fasciitis.   CDAI Exam: CDAI Score: -- Patient Global: --; Provider Global: -- Swollen: --; Tender: -- Joint Exam 02/04/2021   No joint exam has been documented for this visit   There is currently no information documented on the homunculus. Go to the Rheumatology activity and complete the homunculus joint exam.  Investigation: No additional findings.  Imaging: No results found.  Recent Labs: Lab Results  Component Value Date   WBC 5.6 10/08/2020   HGB 14.3 10/08/2020   PLT 211 10/08/2020   NA 140 10/08/2020   K 5.1 10/08/2020   CL 103 10/08/2020   CO2 30 10/08/2020   GLUCOSE 100 (H) 10/08/2020   BUN 12 10/08/2020   CREATININE 0.82 10/08/2020   BILITOT 0.6 10/08/2020   ALKPHOS 83 01/05/2016   AST 13 10/08/2020   ALT 9 10/08/2020   PROT 6.7 10/08/2020   ALBUMIN 4.3 01/05/2016   CALCIUM 9.6 10/08/2020   GFRAA 90 10/08/2020    Speciality Comments: No specialty comments available.  Procedures:  No procedures performed Allergies: Hydroxychloroquine, Prednisone, Doxycycline, Ibuprofen, Keflex [cephalexin], Ketorolac, Lodine [etodolac], Methocarbamol, Oxycodone, Oxycodone, and Robaxin [methocarbamol]   Assessment / Plan:     Visit Diagnoses: Scleroderma ([facility]) - Limited systemic sclerosis, hx of raynaud's, sclerodactyly, Telengectesia's, ANA 1:1280 centromere, anticardiolipin IgM 29:  She has ongoing pain in both hands and both feet, but no obvious synovitis was noted on examination today.  She is not currently taking any immunosuppressive agents. X-rays of both hands and feet were updated today.  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  She was given a list of natural anti-inflammatories to start taking.  She will continue to take Tylenol as needed for pain relief.  She is not a good candidate for prednisone due to risk of renal crisis given history of systemic sclerosis.  Her blood pressure was well  controlled today in the office-121/78.  She is no longer taking amlodipine.  She has been having more frequent symptoms of Raynaud's with the cooler weather temperatures.  No digital ulcerations or signs of gangrene were noted on examination today.  Sclerodactyly unchanged.  Discussed the importance of avoiding triggers and to keep her core body temperature warm.  She is no longer taking amlodipine. She has not been experiencing any new or worsening pulmonary symptoms.  She was evaluated by Dr. Vaughan Browner on 11/28/2020 for follow-up regarding prior high-resolution chest CT on 11/25/2017 and a recent chest CT for lung cancer screening on 09/17/2020.  There was no evidence of ILD on HR CT in 2019.  PFTs were updated on 11/28/20.  She has an upcoming appointment with Dr. Vaughan Browner on 02/28/21.   Discussed the importance of yearly pulmonary hypertension screening due to her history of systemic sclerosis.  Her last echocardiogram was on 10/26/2019 was performed by Dr. Haroldine Laws.  She is overdue to update the recommended yearly echocardiogram so she will call to schedule an appointment with Dr. Haroldine Laws.  She has not been experiencing any reflux, dysphagia, or constipation.   We will update the following lab work today.   She was advised to notify us if she develops any new or worsening symptoms.  She will follow up in 5 months.  - Plan: CBC with Differential/Platelet, COMPLETE METABOLIC PANEL WITH GFR, Urinalysis, Routine w reflex microscopic, ANA, C3 and C4, Sedimentation rate, Anti-DNA antibody, double-stranded  Raynaud's disease without gangrene: She has intermittent symptoms of raynaud's, especially with cooler weather temperatures.  No digital ulcerations or signs of gangrene noted.  Sclerodactyly unchanged.  Delayed capillary refill 2-3 seconds.  Discussed the importance of keeping her core body temperature warm, wearing gloves, and thick socks. She was advised to notify us if she develops any new or worsening  symptoms.   Primary osteoarthritis of both hands: She has PIP and DIP thickening consistent with osteoarthritis of both hands.  She has been experiencing increased pain and stiffness in both hands over the past couple of months.  She has been performing more frequent household activities and yard work which has been exacerbating her discomfort.  X-rays of both hands were updated today. She was given a list of natural antiinflammatories.   Pain in both hands - X-rays of both hands were updated today.  She has tenderness over several MCP and PIP joints, especially the right 1st MCP joint. No obvious synovitis was noted.  Plan: XR Hand 2 View Left, XR Hand 2 View Right  Pain in both feet -She has been experiencing increased pain in both feet. She has tenderness of all MTP joints, especially the left 1st MTP joint.  X-rays of both feet were updated today.   X-rays of both feet were updated today.   Plan: XR Foot 2 Views Left, XR Foot 2 Views Right  Pseudogout - Diagnosed by Dr. Jerene Pitch in the past.   Smoker - 3/4 PPD x 35 years. Lung cancer screening chest CT updated on 09/17/20: benign appearance or behavior. Annual chest CT recommended.   History of osteoporosis - Declined tx in the past.  Last DEXA in epic on 11/17/12. No recent DEXA to review in Epic.   History of vertebral compression fracture: Old, stable L4 compression fracture unchanged on x-rays of lumbar spine on 04/15/20.   Other medical conditions are listed as follows:   History of migraine  Malignant neoplasm of upper-outer quadrant of left female breast, unspecified estrogen receptor status ([place])  Anxiety   Orders: Orders Placed This Encounter  Procedures   XR Hand 2 View Left   XR Hand 2 View Right   XR Foot 2 Views Left   XR Foot 2 Views Right   CBC with Differential/Platelet   COMPLETE METABOLIC PANEL WITH GFR   Urinalysis, Routine w reflex microscopic   ANA   C3 and C4   Sedimentation rate   Anti-DNA antibody,  double-stranded    No orders of the defined types were placed in this encounter.    Follow-Up Instructions: Return in about 5 months (around 07/05/2021) for Scleroderma, Osteoarthritis.   Ofilia Neas, PA-C  Note - This record has been created using Dragon software.  Chart creation errors have been sought, but may not always  have been located. Such creation errors do not reflect on  the standard of medical care.

## 2021-01-21 NOTE — Telephone Encounter (Signed)
Pt is scheduled for pft and ov with [facility]. Pt said that [facility] said that she wouldn't have to have a covid test. I informed pt that if she wasn't vaccinated against covid, she'd need to have a pcr covid test and that she wouldn't be wearing a mask and that it was [employer].. Pt said "well I was told I couldn't get the vaccine d/t health reasons and I've had a covid test and I don't want to do that again" Please advise [phone]

## 2021-01-21 NOTE — Telephone Encounter (Signed)
Called and spoke with pt and she is aware of PFT that has been cancelled and she will keep the appt with PM on 10/28.

## 2021-01-21 NOTE — Telephone Encounter (Signed)
Called and spoke with patient regarding covid test. Patient states that she was told that if she had her PFT done in office, she didn't need to have a covid test. She was going to have it done at [facility] but they required testing. She states she does not want to get tested. States she wanted to cancel her appointment. I advised her that I would send you a message before cancelling it. She wanted to know if she should keep her appointment with you if she doesn't have the PFT.  Dr. Vaughan Browner please advise  Thanks

## 2021-01-21 NOTE — Telephone Encounter (Signed)
Please cancel the PFTs. We can keep the office visit as scheduled

## 2021-02-04 ENCOUNTER — Ambulatory Visit: Payer: Self-pay

## 2021-02-04 ENCOUNTER — Encounter: Payer: Self-pay | Admitting: Physician Assistant

## 2021-02-04 ENCOUNTER — Other Ambulatory Visit: Payer: Self-pay

## 2021-02-04 ENCOUNTER — Ambulatory Visit (INDEPENDENT_AMBULATORY_CARE_PROVIDER_SITE_OTHER): Payer: Commercial Managed Care - PPO | Admitting: Physician Assistant

## 2021-02-04 VITALS — BP 121/78 | HR 94 | Ht 65.0 in | Wt 124.6 lb

## 2021-02-04 DIAGNOSIS — M349 Systemic sclerosis, unspecified: Secondary | ICD-10-CM | POA: Diagnosis not present

## 2021-02-04 DIAGNOSIS — F17200 Nicotine dependence, unspecified, uncomplicated: Secondary | ICD-10-CM

## 2021-02-04 DIAGNOSIS — Z8781 Personal history of (healed) traumatic fracture: Secondary | ICD-10-CM

## 2021-02-04 DIAGNOSIS — I7300 Raynaud's syndrome without gangrene: Secondary | ICD-10-CM

## 2021-02-04 DIAGNOSIS — M79641 Pain in right hand: Secondary | ICD-10-CM | POA: Diagnosis not present

## 2021-02-04 DIAGNOSIS — M19042 Primary osteoarthritis, left hand: Secondary | ICD-10-CM

## 2021-02-04 DIAGNOSIS — F419 Anxiety disorder, unspecified: Secondary | ICD-10-CM

## 2021-02-04 DIAGNOSIS — M79671 Pain in right foot: Secondary | ICD-10-CM

## 2021-02-04 DIAGNOSIS — M1120 Other chondrocalcinosis, unspecified site: Secondary | ICD-10-CM | POA: Diagnosis not present

## 2021-02-04 DIAGNOSIS — M79672 Pain in left foot: Secondary | ICD-10-CM | POA: Diagnosis not present

## 2021-02-04 DIAGNOSIS — C50412 Malignant neoplasm of upper-outer quadrant of left female breast: Secondary | ICD-10-CM

## 2021-02-04 DIAGNOSIS — Z8669 Personal history of other diseases of the nervous system and sense organs: Secondary | ICD-10-CM

## 2021-02-04 DIAGNOSIS — M19041 Primary osteoarthritis, right hand: Secondary | ICD-10-CM

## 2021-02-04 DIAGNOSIS — M79642 Pain in left hand: Secondary | ICD-10-CM | POA: Diagnosis not present

## 2021-02-04 DIAGNOSIS — Z8739 Personal history of other diseases of the musculoskeletal system and connective tissue: Secondary | ICD-10-CM

## 2021-02-05 ENCOUNTER — Telehealth: Payer: Self-pay | Admitting: Physician Assistant

## 2021-02-05 NOTE — Telephone Encounter (Signed)
I called the patient to discuss x-ray results of both hands and both feet obtained yesterday on 02/04/2021.  X-rays of both hands were consistent with early osteoarthritic changes.  No erosive changes were noted.  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  No narrowing or damage was noted in her ankle joints. Patient was encouraged to try the natural anti-inflammatories which were discussed at her office visit yesterday.  She was advised to notify us if she develops increased joint pain or joint swelling.  If her symptoms persist or worsen she can return for an ultrasound of both hands to assess for synovitis. She voiced understanding.  All questions were addressed.  Hazel Sams,  PA-C

## 2021-02-07 LAB — URINALYSIS, ROUTINE W REFLEX MICROSCOPIC
Bilirubin Urine: NEGATIVE
Glucose, UA: NEGATIVE
Hgb urine dipstick: NEGATIVE
Ketones, ur: NEGATIVE
Leukocytes,Ua: NEGATIVE
Nitrite: NEGATIVE
Protein, ur: NEGATIVE
Specific Gravity, Urine: 1.005 (ref 1.001–1.035)
pH: 5.5 (ref 5.0–8.0)

## 2021-02-07 LAB — CBC WITH DIFFERENTIAL/PLATELET
Absolute Monocytes: 384 cells/uL (ref 200–950)
Basophils Absolute: 30 cells/uL (ref 0–200)
Basophils Relative: 0.5 %
Eosinophils Absolute: 83 cells/uL (ref 15–500)
Eosinophils Relative: 1.4 %
HCT: 41.6 % (ref 35.0–45.0)
Hemoglobin: 14.1 g/dL (ref 11.7–15.5)
Lymphs Abs: 1103 cells/uL (ref 850–3900)
MCH: 30.9 pg (ref 27.0–33.0)
MCHC: 33.9 g/dL (ref 32.0–36.0)
MCV: 91 fL (ref 80.0–100.0)
MPV: 9.4 fL (ref 7.5–12.5)
Monocytes Relative: 6.5 %
Neutro Abs: 4301 cells/uL (ref 1500–7800)
Neutrophils Relative %: 72.9 %
Platelets: 219 10*3/uL (ref 140–400)
RBC: 4.57 10*6/uL (ref 3.80–5.10)
RDW: 12.2 % (ref 11.0–15.0)
Total Lymphocyte: 18.7 %
WBC: 5.9 10*3/uL (ref 3.8–10.8)

## 2021-02-07 LAB — COMPLETE METABOLIC PANEL WITH GFR
AG Ratio: 2.3 (calc) (ref 1.0–2.5)
ALT: 10 U/L (ref 6–29)
AST: 13 U/L (ref 10–35)
Albumin: 4.6 g/dL (ref 3.6–5.1)
Alkaline phosphatase (APISO): 81 U/L (ref 37–153)
BUN: 10 mg/dL (ref 7–25)
CO2: 30 mmol/L (ref 20–32)
Calcium: 9.3 mg/dL (ref 8.6–10.4)
Chloride: 102 mmol/L (ref 98–110)
Creat: 0.8 mg/dL (ref 0.50–1.05)
Globulin: 2 g/dL (calc) (ref 1.9–3.7)
Glucose, Bld: 92 mg/dL (ref 65–99)
Potassium: 4.2 mmol/L (ref 3.5–5.3)
Sodium: 140 mmol/L (ref 135–146)
Total Bilirubin: 0.6 mg/dL (ref 0.2–1.2)
Total Protein: 6.6 g/dL (ref 6.1–8.1)
eGFR: 84 mL/min/{1.73_m2} (ref >=60)

## 2021-02-07 LAB — ANTI-DNA ANTIBODY, DOUBLE-STRANDED: ds DNA Ab: 1 IU/mL

## 2021-02-07 LAB — SEDIMENTATION RATE: Sed Rate: 6 mm/h (ref 0–30)

## 2021-02-07 LAB — ANTI-NUCLEAR AB-TITER (ANA TITER)
ANA TITER: 1:80 {titer} — ABNORMAL HIGH
ANA Titer 1: >=1:1280 {titer} — AB

## 2021-02-07 LAB — C3 AND C4
C3 Complement: 85 mg/dL (ref 83–193)
C4 Complement: 10 mg/dL — ABNORMAL LOW (ref 15–57)

## 2021-02-07 LAB — ANA: Anti Nuclear Antibody (ANA): POSITIVE — AB

## 2021-02-07 NOTE — Progress Notes (Signed)
ANA remains positive. C4 is chronically low. dsDNA is negative. ESR WNL. CBC and CMP WNL.  UA normal.

## 2021-02-28 ENCOUNTER — Ambulatory Visit (INDEPENDENT_AMBULATORY_CARE_PROVIDER_SITE_OTHER): Payer: Commercial Managed Care - PPO | Admitting: Pulmonary Disease

## 2021-02-28 ENCOUNTER — Encounter: Payer: Self-pay | Admitting: Pulmonary Disease

## 2021-02-28 ENCOUNTER — Other Ambulatory Visit: Payer: Self-pay

## 2021-02-28 DIAGNOSIS — F17210 Nicotine dependence, cigarettes, uncomplicated: Secondary | ICD-10-CM | POA: Diagnosis not present

## 2021-02-28 DIAGNOSIS — J449 Chronic obstructive pulmonary disease, unspecified: Secondary | ICD-10-CM

## 2021-02-28 NOTE — Progress Notes (Signed)
Doris Bender    [id number]    [birth year]  Primary Care Physician:Howard, Lennette Bihari, MD  Referring Physician: Rory Percy, MD [place],  [place] [address]  Chief complaint: Follow-up for dyspnea, COPD, evaluation for interstitial lung disease  HPI: Active smoker with history of limited systemic sclerosis, Raynaud's, pseudogout, COPD, breast cancer Previously followed by Dr. [place] Bells and last seen [place] with Dr. Estanislado Pandy.  On amlodipine for Raynaud's syndrome.  Not on any immunosuppressive therapy She has previously been on steroids and Plaquenil which was stopped due to side effects Has annual echocardiogram and follow-up with Dr. Haroldine Laws.  No evidence of pulmonary hypertension on prior studies.  Denies any dyspnea, she is on albuterol which she uses very rarely No cough, sputum production, fevers, chills  PFTs were ordered this year at [facility] but she did not want to get them done as there was a requirement for COVID testing  Pets: Outside dog Occupation: Retired Education officer, museum Exposures: Used to have a down pillow until 2021.  No ongoing exposures.  No mold, hot tub, Jacuzzi.  No feather pillows or comforters Smoking history: 40-pack-year smoker.  Continues to smoke half pack per day Travel history: No significant travel history Relevant family history: No family history of lung disease  Interim history Unable to get repeat PFTs as she is not vaccinated and she did not want to get the preprocedure COVID testing  States that breathing is doing well Working on smoking cessation with nicotine patches Still continues to smoke a few cigarettes every day  Outpatient Encounter Medications as of 02/28/2021  Medication Sig   Acetaminophen (TYLENOL ARTHRITIS PAIN PO) Take by mouth as needed.   ALPRAZolam (XANAX) 0.25 MG tablet Take 0.25 mg by mouth at bedtime.   HYDROcodone-acetaminophen (NORCO/VICODIN) 5-325 MG tablet Take 1 tablet by mouth  every 6 (six) hours as needed for moderate pain.   [DISCONTINUED] albuterol (VENTOLIN HFA) 108 (90 Base) MCG/ACT inhaler Inhale 2 puffs into the lungs as needed. (Patient not taking: Reported on 02/04/2021)   No facility-administered encounter medications on file as of 02/28/2021.    Physical Exam: Blood pressure 114/70, pulse 88, height 5\' 5"  (1.651 m), weight 122 lb 3.2 oz (55.4 kg), SpO2 96 %. 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 Screening CT chest 09/17/2020-stable lung nodules, no interstitial lung disease I have reviewed the images personally.  PFTs: FVC 2.66 [77%], FEV1 1.65 [62%], F/F 62, TLC 5.37 [103%], DLCO 13.77 [65%] Moderate-severe obstructive airway disease, air trapping, mild diffusion defect  Labs:  Cardiac: Echocardiogram 10/26/2019-LVEF 60 to 65%, normal PA systolic pressure  Assessment:  Evaluation for interstitial lung disease She has history of limited sclerosis, Raynaud's Prior high-res CT and recent screening CT in May 2022 reviewed with no evidence of interstitial lung disease Continue monitoring  COPD Has moderate COPD on PFTs She does not want any controller medication as she is asymptomatic, continue albuterol as needed  Active smoker Smoking cessation discussed.  She is going through some stressors including diabetes but plans on quitting soon.  She does not want a referral for smoking cessation or prescription  for nicotine, Chantix Reassess at return visit.  Time spent counseling-5 minutes  Plan/Recommendations: Continue albuterol as needed Follow-up in 1 year  Marshell Garfinkel MD  Pulmonary and Critical Care 02/28/2021, 12:22 PM  CC: Rory Percy, MD

## 2021-02-28 NOTE — Patient Instructions (Signed)
Continue annual screening CT chest Continue to work on smoking cessation  Follow-up in 1 year

## 2021-07-02 ENCOUNTER — Ambulatory Visit: Payer: Commercial Managed Care - PPO | Admitting: Rheumatology

## 2021-08-14 NOTE — Progress Notes (Signed)
? ?Office Visit Note ? ?Patient: Doris Bender             ?Date of Birth: [birth year]           ?MRN: [id number]             ?PCP: Caryl Bis, MD ?Referring: Rory Percy, MD ?Visit Date: 08/28/2021 ?Occupation: '@GUAROCC'$ @ ? ?Subjective:  ?Pain in both hands and Raynauds ? ? ?History of Present Illness: Doris Bender is a 61 y.o. female history of limited systemic sclerosis.  She states she continues to have severe Raynauds in her hands and her feet.  She states during the summer the air conditioning is difficult for her.  She has been noticing increased swelling in her hands.  She states her left index finger has been swollen and she has difficulty making a fist.  She has not noticed any digital ulcers.  She is trying to quit smoking but she is still smoking half a pack per day.  She was seen by Dr. Vaughan Browner in October 2022.  She has a CT scan coming up in May.  She has not seen Dr. Haroldine Laws in couple of years.  She plans to schedule an appointment with Dr. Haroldine Laws.  She denies any reflux or dysphagia symptoms. ? ?Activities of Daily Living:  ?Patient reports morning stiffness for 1 hour.   ?Patient Reports nocturnal pain.  ?Difficulty dressing/grooming: Denies ?Difficulty climbing stairs: Reports ?Difficulty getting out of chair: Denies ?Difficulty using hands for taps, buttons, cutlery, and/or writing: Reports ? ?Review of Systems  ?Constitutional:  Positive for fatigue.  ?HENT:  Positive for mouth dryness.   ?Eyes:  Negative for dryness.  ?Respiratory:  Positive for shortness of breath.   ?Cardiovascular:  Positive for swelling in legs/feet.  ?Gastrointestinal:  Negative for constipation.  ?Endocrine: Positive for cold intolerance and excessive thirst.  ?Genitourinary:  Negative for difficulty urinating.  ?Musculoskeletal:  Positive for joint pain, joint pain, joint swelling and morning stiffness.  ?Skin:  Positive for color change and skin tightness. Negative for rash and sensitivity to sunlight.   ?Allergic/Immunologic: Negative for susceptible to infections.  ?Neurological:  Positive for numbness.  ?Hematological:  Negative for bruising/bleeding tendency.  ?Psychiatric/Behavioral:  Negative for sleep disturbance.   ? ?PMFS History:  ?Patient Active Problem List  ? Diagnosis Date Noted  ? COPD (chronic obstructive pulmonary disease) ([place]) 09/11/2019  ? Smoker 09/24/2017  ? Primary osteoarthritis of both hands 09/24/2017  ? Autoimmune disease ([place]) 09/24/2017  ? History of osteoporosis 09/13/2017  ? History of vertebral compression fracture 09/13/2017  ? Raynaud's syndrome without gangrene 09/13/2017  ? Pseudogout 09/13/2017  ? History of migraine 09/13/2017  ? Left lateral epicondylitis 07/24/2015  ? Back pain 07/24/2013  ? Compression fracture of L4 vertebra ([facility]) 07/24/2013  ? ANA positive 05/31/2013  ? Osteoporosis 05/31/2013  ? Raynaud phenomenon 05/31/2013  ? Breast cancer ([place]) 05/30/2013  ? Cancer ([place]) 05/04/2013  ? Breast cancer of upper-outer quadrant of left female breast ([place]) 10/18/2012  ?  ?Past Medical History:  ?Diagnosis Date  ? Arthritis   ? lt great toe (RA)  ? Breast cancer ([place]) 11/15/12  ? left  ? Cancer [facility])   ? breast  ? Compression fracture of L4 lumbar vertebra 11/04/2012  ? injury  ? Contact lens/glasses fitting   ? wears contacts or glasses  ? Gout 8-9 yrs ago  ? Rt heel/foot  ? History of radiation therapy 02/20/13-04/07/13  ? left breast 50.4 gray, upper  outer quadrant boosted to 62.4 gray  ? Osteoporosis   ? Pseudogout of joint of left foot 6 yrs ago  ? Raynaud's disease   ? bilateral hands and feet  ? Wears partial dentures   ? partial lower  ?  ?Family History  ?Problem Relation Age of Onset  ? Hyperthyroidism Mother   ? Cirrhosis Father   ? Diabetes Brother   ? Heart attack Maternal Grandmother   ? Heart attack Maternal Grandfather   ? ?Past Surgical History:  ?Procedure Laterality Date  ? BREAST LUMPECTOMY WITH NEEDLE LOCALIZATION AND AXILLARY SENTINEL LYMPH NODE BX Left  12/21/2012  ? Procedure: BREAST LUMPECTOMY WITH NEEDLE LOCALIZATION AND AXILLARY SENTINEL LYMPH NODE BX;  Surgeon: Haywood Lasso, MD;  Location: [place];  Service: General;  Laterality: Left;  needle local BCG  ?  ? BREAST SURGERY  age 21  ? left breast bx- benign  ? CERVICAL CONIZATION W/BX  2012  ? benign  ? ELBOW SURGERY    ? KNEE ARTHROPLASTY    ? KNEE ARTHROSCOPY  2006  ? right  ? LATERAL EPICONDYLE RELEASE  03/08/2012  ? Procedure: TENNIS ELBOW RELEASE;  Surgeon: Cammie Sickle., MD;  Location: [place];  Service: Orthopedics;  Laterality: Right;  RECONSTRUCTION OF RIGHT LATERAL ELBOW WITH TENDONS  ? MASS EXCISION  03/08/2012  ? Procedure: EXCISION MASS;  Surgeon: Cammie Sickle., MD;  Location: [place];  Service: Orthopedics;  Laterality: Right;  EXCISION OF RIGHT WRIST DORSAL CYST  ? TENNIS ELBOW RELEASE/NIRSCHEL PROCEDURE Left 09/12/2015  ? Procedure: ARTHROTOMY LEFT ELBOW, REPAIR EXTENSOR ORIGIN;  Surgeon: Daryll Brod, MD;  Location: [place];  Service: Orthopedics;  Laterality: Left;  ? TONSILLECTOMY    ? TONSILLECTOMY    ? 61yr old  ? WRIST GANGLION EXCISION    ? left x2  ? ?Social History  ? ?Social History Narrative  ? ** Merged History Encounter **  ?    ? ? ?There is no immunization history on file for this patient.  ? ?Objective: ?Vital Signs: BP 135/83 (BP Location: Right Arm, Patient Position: Sitting, Cuff Size: Small)   Pulse 94   Resp 13   Ht '5\' 5"'$  (1.651 m)   Wt 125 lb 12.8 oz (57.1 kg)   BMI 20.93 kg/m?   ? ?Physical Exam ?Vitals and nursing note reviewed.  ?Constitutional:   ?   Appearance: She is well-developed.  ?HENT:  ?   Head: Normocephalic and atraumatic.  ?Eyes:  ?   Conjunctiva/sclera: Conjunctivae normal.  ?Cardiovascular:  ?   Rate and Rhythm: Normal rate and regular rhythm.  ?   Heart sounds: Normal heart sounds.  ?Pulmonary:  ?   Effort: Pulmonary effort is normal.  ?   Breath sounds: Normal  breath sounds.  ?Abdominal:  ?   General: Bowel sounds are normal.  ?   Palpations: Abdomen is soft.  ?Musculoskeletal:  ?   Cervical back: Normal range of motion.  ?Lymphadenopathy:  ?   Cervical: No cervical adenopathy.  ?Skin: ?   General: Skin is warm and dry.  ?   Capillary Refill: Capillary refill takes 2 to 3 seconds.  ?   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  No digital ulcers noted.  ?Neurological:  ?   Mental Status: She is alert and oriented to person, place, and time.  ?Psychiatric:     ?   Behavior:  Behavior normal.  ?  ? ?Musculoskeletal Exam: C-spine was in good range of motion.  Shoulder joints, elbow joints, wrist joints with good range of motion.  She had no difficulty with fist formation due to sclerodactyly.  Hip joints and knee joints in good range of motion.  She had no tenderness over ankles and MTPs. ? ?CDAI Exam: ?CDAI Score: -- ?Patient Global: --; Provider Global: -- ?Swollen: --; Tender: -- ?Joint Exam 08/28/2021  ? ?No joint exam has been documented for this visit  ? ?There is currently no information documented on the homunculus. Go to the Rheumatology activity and complete the homunculus joint exam. ? ?Investigation: ?No additional findings. ? ?Imaging: ?No results found. ? ?Recent Labs: ?Lab Results  ?Component Value Date  ? WBC 5.9 02/04/2021  ? HGB 14.1 02/04/2021  ? PLT 219 02/04/2021  ? NA 140 02/04/2021  ? K 4.2 02/04/2021  ? CL 102 02/04/2021  ? CO2 30 02/04/2021  ? GLUCOSE 92 02/04/2021  ? BUN 10 02/04/2021  ? CREATININE 0.80 02/04/2021  ? BILITOT 0.6 02/04/2021  ? ALKPHOS 83 01/05/2016  ? AST 13 02/04/2021  ? ALT 10 02/04/2021  ? PROT 6.6 02/04/2021  ? ALBUMIN 4.3 01/05/2016  ? CALCIUM 9.3 02/04/2021  ? GFRAA 90 10/08/2020  ? ? ?Speciality Comments: No specialty comments available. ? ?Procedures:  ?No procedures performed ?Allergies: Hydroxychloroquine, Prednisone, Doxycycline, Ibuprofen, Keflex [cephalexin], Ketorolac, Lodine  [etodolac], Methocarbamol, Oxycodone, Oxycodone, and Robaxin [methocarbamol]  ? ?Assessment / Plan:     ?Visit Diagnoses: Scleroderma ([place]) - Limited systemic sclerosis, hx of raynaud's, sclerodactyly, Telengectesia

## 2021-08-28 ENCOUNTER — Encounter: Payer: Self-pay | Admitting: Rheumatology

## 2021-08-28 ENCOUNTER — Ambulatory Visit (INDEPENDENT_AMBULATORY_CARE_PROVIDER_SITE_OTHER): Payer: Commercial Managed Care - PPO | Admitting: Rheumatology

## 2021-08-28 ENCOUNTER — Other Ambulatory Visit: Payer: Self-pay | Admitting: *Deleted

## 2021-08-28 VITALS — BP 135/83 | HR 94 | Resp 13 | Ht 65.0 in | Wt 125.8 lb

## 2021-08-28 DIAGNOSIS — M349 Systemic sclerosis, unspecified: Secondary | ICD-10-CM | POA: Diagnosis not present

## 2021-08-28 DIAGNOSIS — Z8669 Personal history of other diseases of the nervous system and sense organs: Secondary | ICD-10-CM

## 2021-08-28 DIAGNOSIS — M19041 Primary osteoarthritis, right hand: Secondary | ICD-10-CM

## 2021-08-28 DIAGNOSIS — M79642 Pain in left hand: Secondary | ICD-10-CM

## 2021-08-28 DIAGNOSIS — I7300 Raynaud's syndrome without gangrene: Secondary | ICD-10-CM | POA: Diagnosis not present

## 2021-08-28 DIAGNOSIS — Z8781 Personal history of (healed) traumatic fracture: Secondary | ICD-10-CM

## 2021-08-28 DIAGNOSIS — F419 Anxiety disorder, unspecified: Secondary | ICD-10-CM

## 2021-08-28 DIAGNOSIS — M79671 Pain in right foot: Secondary | ICD-10-CM

## 2021-08-28 DIAGNOSIS — C50412 Malignant neoplasm of upper-outer quadrant of left female breast: Secondary | ICD-10-CM

## 2021-08-28 DIAGNOSIS — Z8739 Personal history of other diseases of the musculoskeletal system and connective tissue: Secondary | ICD-10-CM

## 2021-08-28 DIAGNOSIS — Z8709 Personal history of other diseases of the respiratory system: Secondary | ICD-10-CM

## 2021-08-28 DIAGNOSIS — F17200 Nicotine dependence, unspecified, uncomplicated: Secondary | ICD-10-CM

## 2021-08-28 DIAGNOSIS — M79672 Pain in left foot: Secondary | ICD-10-CM

## 2021-08-28 DIAGNOSIS — M19042 Primary osteoarthritis, left hand: Secondary | ICD-10-CM

## 2021-08-28 DIAGNOSIS — M1120 Other chondrocalcinosis, unspecified site: Secondary | ICD-10-CM

## 2021-08-28 MED ORDER — AMLODIPINE BESYLATE 5 MG PO TABS: 5.0000 mg | ORAL_TABLET | Freq: Every day | ORAL | 2 refills | Status: DC

## 2021-08-29 LAB — COMPLETE METABOLIC PANEL WITH GFR
AG Ratio: 2.2 (calc) (ref 1.0–2.5)
ALT: 10 U/L (ref 6–29)
AST: 14 U/L (ref 10–35)
Albumin: 4.4 g/dL (ref 3.6–5.1)
Alkaline phosphatase (APISO): 78 U/L (ref 37–153)
BUN: 12 mg/dL (ref 7–25)
CO2: 34 mmol/L — ABNORMAL HIGH (ref 20–32)
Calcium: 9.2 mg/dL (ref 8.6–10.4)
Chloride: 102 mmol/L (ref 98–110)
Creat: 0.72 mg/dL (ref 0.50–1.05)
Globulin: 2 g/dL (calc) (ref 1.9–3.7)
Glucose, Bld: 99 mg/dL (ref 65–99)
Potassium: 4.8 mmol/L (ref 3.5–5.3)
Sodium: 141 mmol/L (ref 135–146)
Total Bilirubin: 0.4 mg/dL (ref 0.2–1.2)
Total Protein: 6.4 g/dL (ref 6.1–8.1)
eGFR: 94 mL/min/{1.73_m2} (ref >=60)

## 2021-08-29 LAB — CBC WITH DIFFERENTIAL/PLATELET
Absolute Monocytes: 437 {cells}/uL (ref 200–950)
Basophils Absolute: 30 {cells}/uL (ref 0–200)
Basophils Relative: 0.5 %
Eosinophils Absolute: 148 {cells}/uL (ref 15–500)
Eosinophils Relative: 2.5 %
HCT: 41.8 % (ref 35.0–45.0)
Hemoglobin: 14.2 g/dL (ref 11.7–15.5)
Lymphs Abs: 1298 {cells}/uL (ref 850–3900)
MCH: 30.7 pg (ref 27.0–33.0)
MCHC: 34 g/dL (ref 32.0–36.0)
MCV: 90.5 fL (ref 80.0–100.0)
MPV: 9.4 fL (ref 7.5–12.5)
Monocytes Relative: 7.4 %
Neutro Abs: 3988 {cells}/uL (ref 1500–7800)
Neutrophils Relative %: 67.6 %
Platelets: 205 Thousand/uL (ref 140–400)
RBC: 4.62 Million/uL (ref 3.80–5.10)
RDW: 12.3 % (ref 11.0–15.0)
Total Lymphocyte: 22 %
WBC: 5.9 Thousand/uL (ref 3.8–10.8)

## 2021-08-29 LAB — URINALYSIS, ROUTINE W REFLEX MICROSCOPIC
Bilirubin Urine: NEGATIVE
Glucose, UA: NEGATIVE
Hgb urine dipstick: NEGATIVE
Ketones, ur: NEGATIVE
Leukocytes,Ua: NEGATIVE
Nitrite: NEGATIVE
Protein, ur: NEGATIVE
Specific Gravity, Urine: 1.013 (ref 1.001–1.035)
pH: 7.5 (ref 5.0–8.0)

## 2021-08-29 NOTE — Progress Notes (Signed)
CBC, CMP and UA are within normal limits.

## 2021-09-17 ENCOUNTER — Ambulatory Visit (HOSPITAL_COMMUNITY)
Admission: RE | Admit: 2021-09-17 | Discharge: 2021-09-17 | Disposition: A | Discharge status: Home / Self Care | Payer: Commercial Managed Care - PPO | Source: Ambulatory Visit | Attending: Family Medicine | Admitting: Family Medicine

## 2021-09-17 DIAGNOSIS — F17210 Nicotine dependence, cigarettes, uncomplicated: Secondary | ICD-10-CM | POA: Insufficient documentation

## 2021-09-19 ENCOUNTER — Other Ambulatory Visit: Payer: Self-pay | Admitting: Acute Care

## 2021-09-19 DIAGNOSIS — Z87891 Personal history of nicotine dependence: Secondary | ICD-10-CM

## 2021-09-19 DIAGNOSIS — Z122 Encounter for screening for malignant neoplasm of respiratory organs: Secondary | ICD-10-CM

## 2021-09-19 DIAGNOSIS — F17210 Nicotine dependence, cigarettes, uncomplicated: Secondary | ICD-10-CM

## 2022-01-02 ENCOUNTER — Telehealth: Payer: Self-pay | Admitting: Rheumatology

## 2022-01-02 NOTE — Telephone Encounter (Signed)
Faxed office notes as requested.

## 2022-01-02 NOTE — Telephone Encounter (Signed)
[facility] dermatology called the office stating they received a referral for the patient but didn't have much clinical information as to why the patient was referred. They request office notes be faxed to [phone].

## 2022-02-16 NOTE — Progress Notes (Signed)
Office Visit Note  Patient: Doris Bender             Date of Birth: 1961-03-31           MRN: [id number]             PCP: Caryl Bis, MD Referring: Caryl Bis, MD Visit Date: 03/02/2022 Occupation: '@GUAROCC'$ @  Subjective:  Raynaud's phenomenon intermittently   History of Present Illness: Doris Bender is a 61 y.o. female with history of scleroderma, osteoarthritis and pseudogout. Patient is not currently taking any immunosuppressive agents.  The use of CellCept was discussed at her last office visit on 08/28/2021.  She after reviewing the side effects she was concerned about the risk for hypotension and did not start on the medication.  She is also not taking amlodipine at this time.  Her symptoms of Raynaud's have been intermittent.  She denies any digital ulcerations or signs of gangrene.  She uses gloves as well as hand warmers as needed.  She has noticed increased pain and stiffness in both hands especially first thing in the mornings.  She has difficulty opening caps and jars.  She is also having pain in the left foot due to arthritis in her great toe along with a bunion.  She takes Tylenol arthritis and hydrocodone for moderate to severe pain relief as needed. She has an upcoming appointment with Dr. Vaughan Browner on 03/12/2022.  She experiences shortness of breath on exertion intermittently.  She is planning on trying to quit smoking.   Activities of Daily Living:  Patient reports morning stiffness for 1 hour.   Patient Reports nocturnal pain.  Difficulty dressing/grooming: Denies Difficulty climbing stairs: Denies Difficulty getting out of chair: Denies Difficulty using hands for taps, buttons, cutlery, and/or writing: Reports  Review of Systems  Constitutional:  Positive for fatigue.  HENT:  Positive for mouth dryness. Negative for mouth sores.   Eyes:  Positive for dryness.  Respiratory:  Positive for shortness of breath.   Cardiovascular:  Negative for chest pain  and palpitations.  Gastrointestinal:  Negative for blood in stool, constipation and diarrhea.  Endocrine: Negative for increased urination.  Genitourinary:  Negative for involuntary urination.  Musculoskeletal:  Positive for joint pain, joint pain, joint swelling and morning stiffness. Negative for gait problem, myalgias, muscle weakness, muscle tenderness and myalgias.  Skin:  Positive for color change and sensitivity to sunlight. Negative for rash and hair loss.  Allergic/Immunologic: Negative for susceptible to infections.  Neurological:  Negative for dizziness and headaches.  Hematological:  Negative for swollen glands.  Psychiatric/Behavioral:  Negative for depressed mood and sleep disturbance. The patient is not nervous/anxious.     PMFS History:  Patient Active Problem List   Diagnosis Date Noted   COPD (chronic obstructive pulmonary disease) ([place]) 09/11/2019   Smoker 09/24/2017   Primary osteoarthritis of both hands 09/24/2017   Autoimmune disease ([place]) 09/24/2017   History of osteoporosis 09/13/2017   History of vertebral compression fracture 09/13/2017   Raynaud's syndrome without gangrene 09/13/2017   Pseudogout 09/13/2017   History of migraine 09/13/2017   Left lateral epicondylitis 07/24/2015   Back pain 07/24/2013   Compression fracture of L4 vertebra ([place]) 07/24/2013   ANA positive 05/31/2013   Osteoporosis 05/31/2013   Raynaud phenomenon 05/31/2013   Breast cancer ([place]) 05/30/2013   Cancer ([place]) 05/04/2013   Breast cancer of upper-outer quadrant of left female breast ([place]) 10/18/2012    Past Medical History:  Diagnosis Date  Arthritis    lt great toe (RA)   Breast cancer ([place]) 11/15/2012   left   Cancer ([place])    breast   Compression fracture of L4 lumbar vertebra 11/04/2012   injury   Contact lens/glasses fitting    wears contacts or glasses   Gout 8-9 yrs ago   Rt heel/foot   History of radiation therapy 02/20/13-04/07/13   left breast 50.4 gray,  upper outer quadrant boosted to 62.4 gray   Osteoporosis    Pseudogout of joint of left foot 6 yrs ago   Raynaud's disease    bilateral hands and feet   Scleroderma ([place])    Wears partial dentures    partial lower    Family History  Problem Relation Age of Onset   Hyperthyroidism Mother    Cirrhosis Father    Diabetes Brother    Heart attack Maternal Grandmother    Heart attack Maternal Grandfather    Past Surgical History:  Procedure Laterality Date   BREAST LUMPECTOMY WITH NEEDLE LOCALIZATION AND AXILLARY SENTINEL LYMPH NODE BX Left 12/21/2012   Procedure: BREAST LUMPECTOMY WITH NEEDLE LOCALIZATION AND AXILLARY SENTINEL LYMPH NODE BX;  Surgeon: Haywood Lasso, MD;  Location: [facility];  Service: General;  Laterality: Left;  needle local BCG     BREAST SURGERY  age 82   left breast bx- benign   CERVICAL CONIZATION W/BX  2012   benign   ELBOW SURGERY     KNEE ARTHROPLASTY     KNEE ARTHROSCOPY  2006   right   LATERAL EPICONDYLE RELEASE  03/08/2012   Procedure: TENNIS ELBOW RELEASE;  Surgeon: Cammie Sickle., MD;  Location: [facility];  Service: Orthopedics;  Laterality: Right;  RECONSTRUCTION OF RIGHT LATERAL ELBOW WITH TENDONS   MASS EXCISION  03/08/2012   Procedure: EXCISION MASS;  Surgeon: Cammie Sickle., MD;  Location: [facility];  Service: Orthopedics;  Laterality: Right;  EXCISION OF RIGHT WRIST DORSAL CYST   TENNIS ELBOW RELEASE/NIRSCHEL PROCEDURE Left 09/12/2015   Procedure: ARTHROTOMY LEFT ELBOW, REPAIR EXTENSOR ORIGIN;  Surgeon: Daryll Brod, MD;  Location: [facility];  Service: Orthopedics;  Laterality: Left;   TONSILLECTOMY     TONSILLECTOMY     61yr old   WRIST GANGLION EXCISION     left x2   Social History   Social History Narrative   ** Merged History Encounter **        There is no immunization history on file for this patient.   Objective: Vital Signs: BP 114/69 (BP Location: Right  Arm, Patient Position: Sitting, Cuff Size: Normal)   Pulse 94   Resp 16   Ht '5\' 5"'$  (1.651 m)   Wt 127 lb (57.6 kg)   BMI 21.13 kg/m    Physical Exam Vitals and nursing note reviewed.  Constitutional:      Appearance: She is well-developed.  HENT:     Head: Normocephalic and atraumatic.  Eyes:     Conjunctiva/sclera: Conjunctivae normal.  Cardiovascular:     Rate and Rhythm: Normal rate and regular rhythm.     Heart sounds: Normal heart sounds.  Pulmonary:     Effort: Pulmonary effort is normal.     Breath sounds: Normal breath sounds.  Abdominal:     General: Bowel sounds are normal.     Palpations: Abdomen is soft.  Musculoskeletal:     Cervical back: Normal range of motion.  Skin:  General: Skin is warm and dry.     Capillary Refill: Capillary refill takes less than 2 seconds.     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    Neurological:     Mental Status: She is alert and oriented to person, place, and time.  Psychiatric:        Behavior: Behavior normal.      Musculoskeletal Exam: C-spine, thoracic spine, lumbar spine have good range of motion.  Some right SI joint tenderness upon palpation.  No midline spinal tenderness.  Shoulder joints, elbow joints, and wrist joints have good ROM.  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  Hip joints, knee joints, and ankle joints have good ROM with no discomfort.  No warmth or effusion of knee joints.  No tenderness or swelling of ankle joints.   CDAI Exam: CDAI Score: -- Patient Global: --; Provider Global: -- Swollen: --; Tender: -- Joint Exam 03/02/2022   No joint exam has been documented for this visit   There is currently no information documented on the homunculus. Go to the Rheumatology activity and complete the homunculus joint exam.  Investigation: No additional findings.  Imaging: No results found.  Recent Labs: Lab Results  Component Value Date   WBC 5.9  08/28/2021   HGB 14.2 08/28/2021   PLT 205 08/28/2021   NA 141 08/28/2021   K 4.8 08/28/2021   CL 102 08/28/2021   CO2 34 (H) 08/28/2021   GLUCOSE 99 08/28/2021   BUN 12 08/28/2021   CREATININE 0.72 08/28/2021   BILITOT 0.4 08/28/2021   ALKPHOS 83 01/05/2016   AST 14 08/28/2021   ALT 10 08/28/2021   PROT 6.4 08/28/2021   ALBUMIN 4.3 01/05/2016   CALCIUM 9.2 08/28/2021   GFRAA 90 10/08/2020    Speciality Comments: No specialty comments available.  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  At her last office visit, Dr. Estanislado Pandy had a detailed discussion with the patient in regards to initiating a trial of CellCept.  The patient declined at that time due to being apprehensive of potential side effects.  She was also given a prescription for amlodipine which she did not take due to the concern for hypotension. She continues to have intermittent symptoms of Raynaud's and has been using gloves as well as hot hands for symptomatic relief.  She has incomplete fist formation on examination today due to sclerodactyly.  She has been noticing increased pain and stiffness in both hands especially first thing in the mornings.  She has had more swelling in her left index finger.  Discussed that she would benefit from adding on CellCept but she does not want to take any prescription medications especially immunosuppressive agents at this time.  She plans on continuing to take turmeric for the natural anti-inflammatory properties. She continues to experience shortness of breath on  exertion.  She has an upcoming appointment with Dr. Vaughan Browner on 03/12/2022.  Patient plans on trying to quit smoking. CBC, CMP, and UA WNL on 08/28/21. Orders for CBC, CMP, and UA  released today.  She will notify us if she would like to try CellCept in the future.  She will follow-up in the office in  6 months or sooner if needed.  - Plan: CBC with Differential/Platelet, COMPLETE METABOLIC PANEL WITH GFR, Urinalysis, Routine w reflex microscopic, ANA, Cardiolipin antibodies, IgG, IgM, IgA, C3 and C4, Sedimentation rate  Raynaud's disease without gangrene -She continues to experience intermittent symptoms of Raynaud's phenomenon.  She has been using gloves and hot hands for symptomatic relief. She had no digital ulcerations on examination today.  She continues to smoke cigarettes and is on trying to quit smoking.  She was given a prescription for amlodipine at her last office visit but she was apprehensive to initiate therapy due to the concern for hypotension.  Primary osteoarthritis of both hands: She has PIP and DIP thickening consistent with osteoarthritis of both hands.  She has been noticing increased pain, stiffness, and swelling in both hands especially first thing in the morning.  This morning she was having difficulty bending her left index finger due to the severity of swelling and stiffness.  Discussed the importance of joint protection and muscle strengthening.  Pain in both feet: Chronic pain especially in the left great toe.  Pseudogout - Diagnosed by Dr. Jerene Pitch in the past.   Smoker - 3/4 PPD x 35 years. Lung cancer screening chest CT updated on 09/17/20: benign appearance or behavior. Annual chest CT recommended by Dr. Vaughan Browner. CT chest lung cancer screening 09/17/2021 benign appearance.  Continue annual screening with low-dose chest CT without contrast in 12 months is recommended.  History of COPD - Diagnosed as moderate COPD by Dr. Vaughan Browner.  History of vertebral compression fracture -  Old, stable L4 compression fracture unchanged on x-rays of lumbar spine on 04/15/20.   History of osteoporosis - Declined tx in the past.  Last DEXA in epic on 11/17/12.  Other medical conditions are listed as follows:   Anxiety  Malignant neoplasm of upper-outer quadrant of left female breast, unspecified estrogen receptor status ([place])  History of migraine  Orders: Orders Placed This Encounter  Procedures   CBC with Differential/Platelet   COMPLETE METABOLIC PANEL WITH GFR   Urinalysis, Routine w reflex microscopic   ANA   Cardiolipin antibodies, IgG, IgM, IgA   C3 and C4   Sedimentation rate   No orders of the defined types were placed in this encounter.     Follow-Up Instructions: Return in about 6 months (around 09/01/2022) for Scleroderma, Osteoarthritis.   Ofilia Neas, PA-C  Note - This record has been created using Dragon software.  Chart creation errors have been sought, but may not always  have been located. Such creation errors do not reflect on  the standard of medical care.

## 2022-03-02 ENCOUNTER — Encounter: Payer: Self-pay | Admitting: Physician Assistant

## 2022-03-02 ENCOUNTER — Ambulatory Visit: Payer: Commercial Managed Care - PPO | Attending: Physician Assistant | Admitting: Physician Assistant

## 2022-03-02 VITALS — BP 114/69 | HR 94 | Resp 16 | Ht 65.0 in | Wt 127.0 lb

## 2022-03-02 DIAGNOSIS — M1120 Other chondrocalcinosis, unspecified site: Secondary | ICD-10-CM

## 2022-03-02 DIAGNOSIS — C50412 Malignant neoplasm of upper-outer quadrant of left female breast: Secondary | ICD-10-CM

## 2022-03-02 DIAGNOSIS — M79671 Pain in right foot: Secondary | ICD-10-CM | POA: Diagnosis not present

## 2022-03-02 DIAGNOSIS — M79672 Pain in left foot: Secondary | ICD-10-CM

## 2022-03-02 DIAGNOSIS — I7300 Raynaud's syndrome without gangrene: Secondary | ICD-10-CM

## 2022-03-02 DIAGNOSIS — M349 Systemic sclerosis, unspecified: Secondary | ICD-10-CM | POA: Diagnosis not present

## 2022-03-02 DIAGNOSIS — Z8669 Personal history of other diseases of the nervous system and sense organs: Secondary | ICD-10-CM

## 2022-03-02 DIAGNOSIS — Z8781 Personal history of (healed) traumatic fracture: Secondary | ICD-10-CM

## 2022-03-02 DIAGNOSIS — Z8709 Personal history of other diseases of the respiratory system: Secondary | ICD-10-CM

## 2022-03-02 DIAGNOSIS — F419 Anxiety disorder, unspecified: Secondary | ICD-10-CM

## 2022-03-02 DIAGNOSIS — F17200 Nicotine dependence, unspecified, uncomplicated: Secondary | ICD-10-CM

## 2022-03-02 DIAGNOSIS — M19041 Primary osteoarthritis, right hand: Secondary | ICD-10-CM | POA: Diagnosis not present

## 2022-03-02 DIAGNOSIS — Z8739 Personal history of other diseases of the musculoskeletal system and connective tissue: Secondary | ICD-10-CM

## 2022-03-02 DIAGNOSIS — M19042 Primary osteoarthritis, left hand: Secondary | ICD-10-CM

## 2022-03-03 NOTE — Progress Notes (Signed)
CBC and CMP WNL.   UA normal.  ESR WNL.

## 2022-03-04 NOTE — Progress Notes (Signed)
C4 remains slightly low but stable. C3 WNL.

## 2022-03-05 LAB — URINALYSIS, ROUTINE W REFLEX MICROSCOPIC
Bilirubin Urine: NEGATIVE
Glucose, UA: NEGATIVE
Hgb urine dipstick: NEGATIVE
Ketones, ur: NEGATIVE
Leukocytes,Ua: NEGATIVE
Nitrite: NEGATIVE
Protein, ur: NEGATIVE
Specific Gravity, Urine: 1.01 (ref 1.001–1.035)
pH: 7 (ref 5.0–8.0)

## 2022-03-05 LAB — ANA: Anti Nuclear Antibody (ANA): POSITIVE — AB

## 2022-03-05 LAB — COMPLETE METABOLIC PANEL WITH GFR
AG Ratio: 2.1 (calc) (ref 1.0–2.5)
ALT: 10 U/L (ref 6–29)
AST: 13 U/L (ref 10–35)
Albumin: 4.6 g/dL (ref 3.6–5.1)
Alkaline phosphatase (APISO): 95 U/L (ref 37–153)
BUN: 13 mg/dL (ref 7–25)
CO2: 29 mmol/L (ref 20–32)
Calcium: 9.7 mg/dL (ref 8.6–10.4)
Chloride: 101 mmol/L (ref 98–110)
Creat: 0.83 mg/dL (ref 0.50–1.05)
Globulin: 2.2 g/dL (calc) (ref 1.9–3.7)
Glucose, Bld: 92 mg/dL (ref 65–99)
Potassium: 4.8 mmol/L (ref 3.5–5.3)
Sodium: 141 mmol/L (ref 135–146)
Total Bilirubin: 0.5 mg/dL (ref 0.2–1.2)
Total Protein: 6.8 g/dL (ref 6.1–8.1)
eGFR: 79 mL/min/{1.73_m2} (ref >=60)

## 2022-03-05 LAB — CBC WITH DIFFERENTIAL/PLATELET
Absolute Monocytes: 409 cells/uL (ref 200–950)
Basophils Absolute: 31 cells/uL (ref 0–200)
Basophils Relative: 0.5 %
Eosinophils Absolute: 130 cells/uL (ref 15–500)
Eosinophils Relative: 2.1 %
HCT: 41.6 % (ref 35.0–45.0)
Hemoglobin: 14.1 g/dL (ref 11.7–15.5)
Lymphs Abs: 1327 cells/uL (ref 850–3900)
MCH: 30.2 pg (ref 27.0–33.0)
MCHC: 33.9 g/dL (ref 32.0–36.0)
MCV: 89.1 fL (ref 80.0–100.0)
MPV: 9.6 fL (ref 7.5–12.5)
Monocytes Relative: 6.6 %
Neutro Abs: 4303 cells/uL (ref 1500–7800)
Neutrophils Relative %: 69.4 %
Platelets: 211 10*3/uL (ref 140–400)
RBC: 4.67 10*6/uL (ref 3.80–5.10)
RDW: 12.1 % (ref 11.0–15.0)
Total Lymphocyte: 21.4 %
WBC: 6.2 10*3/uL (ref 3.8–10.8)

## 2022-03-05 LAB — C3 AND C4
C3 Complement: 94 mg/dL (ref 83–193)
C4 Complement: 11 mg/dL — ABNORMAL LOW (ref 15–57)

## 2022-03-05 LAB — CARDIOLIPIN ANTIBODIES, IGG, IGM, IGA
Anticardiolipin IgA: 7.4 APL-U/mL (ref <20.0)
Anticardiolipin IgG: <2 GPL-U/mL (ref <20.0)
Anticardiolipin IgM: 10.7 MPL-U/mL (ref <20.0)

## 2022-03-05 LAB — ANTI-NUCLEAR AB-TITER (ANA TITER)
ANA TITER: 1:40 {titer} — ABNORMAL HIGH
ANA Titer 1: 1:1280 {titer} — ABNORMAL HIGH

## 2022-03-05 LAB — SEDIMENTATION RATE: Sed Rate: 19 mm/h (ref 0–30)

## 2022-03-05 NOTE — Progress Notes (Signed)
ANA remains positive.

## 2022-03-06 NOTE — Progress Notes (Signed)
Anticardiolipin antibodies negative

## 2022-03-12 ENCOUNTER — Ambulatory Visit (INDEPENDENT_AMBULATORY_CARE_PROVIDER_SITE_OTHER): Payer: Commercial Managed Care - PPO | Admitting: Pulmonary Disease

## 2022-03-12 ENCOUNTER — Encounter: Payer: Self-pay | Admitting: Pulmonary Disease

## 2022-03-12 VITALS — BP 98/62 | HR 95 | Temp 98.2°F | Ht 65.0 in | Wt 127.8 lb

## 2022-03-12 DIAGNOSIS — F17210 Nicotine dependence, cigarettes, uncomplicated: Secondary | ICD-10-CM

## 2022-03-12 DIAGNOSIS — J449 Chronic obstructive pulmonary disease, unspecified: Secondary | ICD-10-CM | POA: Diagnosis not present

## 2022-03-12 DIAGNOSIS — F17200 Nicotine dependence, unspecified, uncomplicated: Secondary | ICD-10-CM

## 2022-03-12 MED ORDER — TRELEGY ELLIPTA 200-62.5-25 MCG/ACT IN AEPB: 1.0000 | INHALATION_SPRAY | Freq: Every day | RESPIRATORY_TRACT | 0 refills | Status: DC

## 2022-03-12 NOTE — Progress Notes (Signed)
Doris Bender    [id number]    August 19, 1960  Primary Care Physician:Daniel, Mitzie Na, MD  Referring Physician: Caryl Bis, MD [name],  [place] [id number]  Chief complaint: Follow-up for dyspnea, COPD, evaluation for interstitial lung disease  HPI: Active smoker with history of limited systemic sclerosis, Raynaud's, pseudogout, COPD, breast cancer  Follows with Dr. Estanislado Pandy.  On amlodipine for Raynaud's syndrome.  Not on any immunosuppressive therapy She has previously been on steroids and Plaquenil which was stopped due to side effects Has annual echocardiogram and follow-up with Dr. Haroldine Laws.  No evidence of pulmonary hypertension on prior studies.  Denies any dyspnea, she is on albuterol which she uses very rarely No cough, sputum production, fevers, chills  PFTs were ordered this year at [facility] but she did not want to get them done as there was a requirement for COVID testing  Pets: Outside dog Occupation: Retired Education officer, museum Exposures: Used to have a down pillow until 2021.  No ongoing exposures.  No mold, hot tub, Jacuzzi.  No feather pillows or comforters Smoking history: 40-pack-year smoker.  Continues to smoke half pack per day Travel history: No significant travel history Relevant family history: No family history of lung disease  Interim history: She is here for further interval of 1 year for reassessment.  Continues follow-up with rheumatology for scleroderma and is not on any particular treatment.  Continues intermittent use of albuterol for dyspnea Continues to smoke.   Outpatient Encounter Medications as of 03/12/2022  Medication Sig   Acetaminophen (TYLENOL ARTHRITIS PAIN PO) Take by mouth as needed.   albuterol (VENTOLIN HFA) 108 (90 Base) MCG/ACT inhaler as needed.   ALPRAZolam (XANAX) 0.25 MG tablet Take 0.25 mg by mouth at bedtime.   HYDROcodone-acetaminophen (NORCO/VICODIN) 5-325 MG tablet Take 1 tablet by mouth every 6  (six) hours as needed for moderate pain.   [DISCONTINUED] amLODipine (NORVASC) 5 MG tablet Take 1 tablet (5 mg total) by mouth daily. (Patient not taking: Reported on 03/02/2022)   No facility-administered encounter medications on file as of 03/12/2022.    Physical Exam: Blood pressure 98/62, pulse 95, temperature 98.2 F (36.8 C), temperature source Oral, height '5\' 5"'$  (1.651 m), weight 127 lb 12.8 oz (58 kg), SpO2 98 %. 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  Stable lung nodules. I have reviewed the images personally.  PFTs: 10/30/19 FVC 2.66 [77%], FEV1 1.65 [62%], F/F 62, TLC 5.37 [103%], DLCO 13.77 [65%] Moderate-severe obstructive airway disease, air trapping, mild diffusion defect  Labs:  Cardiac: Echocardiogram 10/26/2019-LVEF 60 to 65%, normal PA systolic pressure  Assessment:  Evaluation for interstitial lung disease She has history of limited sclerosis, Raynaud's  Prior high-res CT and recent screening CT in May 2023 reviewed with no evidence of interstitial lung disease Continue monitoring  COPD Has moderate COPD on PFTs She does not want any controller medication as she is asymptomatic, continue albuterol as needed  Active smoker Smoking cessation  discussed.  She does not want a referral for smoking cessation or prescription for nicotine, Chantix Reassess at return visit.  Time spent counseling-5 minutes  Plan/Recommendations: Continue albuterol as needed Follow-up in 6 months with PFTs.  Marshell Garfinkel MD [facility] 03/12/2022, 11:17 AM  CC: Caryl Bis, MD

## 2022-03-12 NOTE — Patient Instructions (Addendum)
I am glad you are doing well with regard to your breathing Continue the albuterol inhaler Continue to work on smoking cessation We will schedule PFTs in 6 months and follow-up in 6 months.

## 2022-03-12 NOTE — Addendum Note (Signed)
Addended by: Lorretta Harp on: 03/12/2022 11:43 AM   Modules accepted: Orders

## 2022-05-06 ENCOUNTER — Ambulatory Visit: Payer: Commercial Managed Care - PPO | Admitting: Dermatology

## 2022-07-29 ENCOUNTER — Other Ambulatory Visit: Payer: Self-pay | Admitting: Acute Care

## 2022-07-29 DIAGNOSIS — Z87891 Personal history of nicotine dependence: Secondary | ICD-10-CM

## 2022-07-29 DIAGNOSIS — Z122 Encounter for screening for malignant neoplasm of respiratory organs: Secondary | ICD-10-CM

## 2022-07-29 DIAGNOSIS — F17210 Nicotine dependence, cigarettes, uncomplicated: Secondary | ICD-10-CM

## 2022-08-19 NOTE — Progress Notes (Signed)
Office Visit Note  Patient: Doris Bender             Date of Birth: 11/01/1960           MRN: [id number]             PCP: Richardean Chimera, MD Referring: Richardean Chimera, MD Visit Date: 09/02/2022 Occupation: @GUAROCC @  Subjective:  raynauds phenomenon and pain in both hands  History of Present Illness: Doris Bender is a 62 y.o. female with history of limited systemic sclerosis and osteoarthritis.  She states she continues to have pain and discomfort in her bilateral hands.  She complains of decreased grip strength.  She states the Raynauds is getting worse and she decided to come off most of the medications.  She decided against amlodipine at the previous visits.  She complains of dry mouth.  She continues to have some shortness of breath on exertion.  She states she quit smoking and she has gained weight since then.  She has an appointment coming up with the pulmonologist for PFTs.  She is not to schedule an appointment with Dr. Gala Romney.  She complains of fatigue, sicca symptoms, shortness of breath, Raynauds phenomenon.  She also has been dealing with increased anxiety.  She is applying for disability.    Activities of Daily Living:  Patient reports morning stiffness for 1 hour.   Patient Reports nocturnal pain.  Difficulty dressing/grooming: Denies Difficulty climbing stairs: Denies Difficulty getting out of chair: Denies Difficulty using hands for taps, buttons, cutlery, and/or writing: Reports  Review of Systems  Constitutional:  Positive for fatigue.  HENT:  Positive for mouth dryness. Negative for mouth sores.   Eyes:  Positive for dryness.  Respiratory:  Positive for shortness of breath.   Cardiovascular:  Negative for chest pain and palpitations.  Gastrointestinal:  Negative for blood in stool, constipation and diarrhea.  Endocrine: Negative for increased urination.  Genitourinary:  Negative for involuntary urination.  Musculoskeletal:  Positive for joint  pain, joint pain, joint swelling, myalgias, morning stiffness and myalgias. Negative for gait problem, muscle weakness and muscle tenderness.  Skin:  Positive for color change. Negative for rash, hair loss and sensitivity to sunlight.  Allergic/Immunologic: Negative for susceptible to infections.  Neurological:  Positive for numbness. Negative for dizziness and headaches.  Hematological:  Negative for swollen glands.  Psychiatric/Behavioral:  Positive for sleep disturbance. Negative for depressed mood. The patient is nervous/anxious.     PMFS History:  Patient Active Problem List   Diagnosis Date Noted   COPD (chronic obstructive pulmonary disease) (HCC) 09/11/2019   Smoker 09/24/2017   Primary osteoarthritis of both hands 09/24/2017   Autoimmune disease (HCC) 09/24/2017   History of osteoporosis 09/13/2017   History of vertebral compression fracture 09/13/2017   Raynaud's syndrome without gangrene 09/13/2017   Pseudogout 09/13/2017   History of migraine 09/13/2017   Left lateral epicondylitis 07/24/2015   Back pain 07/24/2013   Compression fracture of L4 vertebra (HCC) 07/24/2013   ANA positive 05/31/2013   Osteoporosis 05/31/2013   Raynaud phenomenon 05/31/2013   Breast cancer (HCC) 05/30/2013   Cancer (HCC) 05/04/2013   Breast cancer of upper-outer quadrant of left female breast (HCC) 10/18/2012    Past Medical History:  Diagnosis Date   Arthritis    lt great toe (RA)   Breast cancer (HCC) 11/15/2012   left   Cancer (HCC)    breast   Compression fracture of L4 lumbar vertebra 11/04/2012   injury  Contact lens/glasses fitting    wears contacts or glasses   Gout 8-9 yrs ago   Rt heel/foot   History of radiation therapy 02/20/13-04/07/13   left breast 50.4 gray, upper outer quadrant boosted to 62.4 gray   Osteoporosis    Pseudogout of joint of left foot 6 yrs ago   Raynaud's disease    bilateral hands and feet   Scleroderma (HCC)    Wears partial dentures     partial lower    Family History  Problem Relation Age of Onset   Hyperthyroidism Mother    Cirrhosis Father    Diabetes Brother    Heart attack Maternal Grandmother    Heart attack Maternal Grandfather    Past Surgical History:  Procedure Laterality Date   BREAST LUMPECTOMY WITH NEEDLE LOCALIZATION AND AXILLARY SENTINEL LYMPH NODE BX Left 12/21/2012   Procedure: BREAST LUMPECTOMY WITH NEEDLE LOCALIZATION AND AXILLARY SENTINEL LYMPH NODE BX;  Surgeon: Currie Paris, MD;  Location: [facility];  Service: General;  Laterality: Left;  needle local BCG     BREAST SURGERY  age 89   left breast bx- benign   CERVICAL CONIZATION W/BX  2012   benign   ELBOW SURGERY     KNEE ARTHROPLASTY     KNEE ARTHROSCOPY  2006   right   LATERAL EPICONDYLE RELEASE  03/08/2012   Procedure: TENNIS ELBOW RELEASE;  Surgeon: Wyn Forster., MD;  Location: [facility];  Service: Orthopedics;  Laterality: Right;  RECONSTRUCTION OF RIGHT LATERAL ELBOW WITH TENDONS   MASS EXCISION  03/08/2012   Procedure: EXCISION MASS;  Surgeon: Wyn Forster., MD;  Location: [facility];  Service: Orthopedics;  Laterality: Right;  EXCISION OF RIGHT WRIST DORSAL CYST   TENNIS ELBOW RELEASE/NIRSCHEL PROCEDURE Left 09/12/2015   Procedure: ARTHROTOMY LEFT ELBOW, REPAIR EXTENSOR ORIGIN;  Surgeon: Cindee Salt, MD;  Location: [facility];  Service: Orthopedics;  Laterality: Left;   TONSILLECTOMY     TONSILLECTOMY     62yrs old   WRIST GANGLION EXCISION     left x2   Social History   Social History Narrative   ** Merged History Encounter **        There is no immunization history on file for this patient.   Objective: Vital Signs: BP 122/76 (BP Location: Right Arm, Patient Position: Sitting, Cuff Size: Normal)   Pulse 80   Resp 16   Ht 5\' 5"  (1.651 m)   Wt 139 lb 3.2 oz (63.1 kg)   BMI 23.16 kg/m    Physical Exam Vitals and nursing note reviewed.   Constitutional:      Appearance: She is well-developed.  HENT:     Head: Normocephalic and atraumatic.  Eyes:     Conjunctiva/sclera: Conjunctivae normal.  Cardiovascular:     Rate and Rhythm: Normal rate and regular rhythm.     Heart sounds: Normal heart sounds.  Pulmonary:     Effort: Pulmonary effort is normal.     Breath sounds: Normal breath sounds.  Abdominal:     General: Bowel sounds are normal.     Palpations: Abdomen is soft.  Musculoskeletal:     Cervical back: Normal range of motion.  Lymphadenopathy:     Cervical: No cervical adenopathy.  Skin:    General: Skin is warm and dry.     Capillary Refill: Capillary refill takes more than 3 seconds.     Comments: Dilated nailbed capillaries  and capillary dropout was noted.  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  No digital ulcers were noted.  Neurological:     Mental Status: She is alert and oriented to person, place, and time.  Psychiatric:        Behavior: Behavior normal.      Musculoskeletal Exam: Cervical, thoracic and lumbar spine were in good range of motion.  Shoulders, elbows, wrist joints were in good range of motion.  She had bilateral sclerodactyly with incomplete fist formation.  Hip joints and knee joints were in good range of motion.  There was no tenderness over ankles or MTPs.  CDAI Exam: CDAI Score: -- Patient Global: --; Provider Global: -- Swollen: --; Tender: -- Joint Exam 09/02/2022   No joint exam has been documented for this visit   There is currently no information documented on the homunculus. Go to the Rheumatology activity and complete the homunculus joint exam.  Investigation: No additional findings.  Imaging: No results found.  Recent Labs: Lab Results  Component Value Date   WBC 6.2 03/02/2022   HGB 14.1 03/02/2022   PLT 211 03/02/2022   NA 141 03/02/2022   K 4.8 03/02/2022   CL 101 03/02/2022   CO2 29 03/02/2022   GLUCOSE 92 03/02/2022   BUN 13 03/02/2022    CREATININE 0.83 03/02/2022   BILITOT 0.5 03/02/2022   ALKPHOS 83 01/05/2016   AST 13 03/02/2022   ALT 10 03/02/2022   PROT 6.8 03/02/2022   ALBUMIN 4.3 01/05/2016   CALCIUM 9.7 03/02/2022   GFRAA 90 10/08/2020    Speciality Comments: No specialty comments available.  Procedures:  No procedures performed Allergies: Hydroxychloroquine, Prednisone, Doxycycline, Ibuprofen, Keflex [cephalexin], Ketorolac, Lodine [etodolac], Methocarbamol, Oxycodone, Oxycodone, and Robaxin [methocarbamol]   Assessment / Plan:     Visit Diagnoses: Scleroderma (HCC) - Limited systemic sclerosis, hx of raynaud's, sclerodactyly, Telengectesia's, ANA 1:1280 centromere, anticardiolipin IgM 29: -She continues to have severe Raynauds.  Nailbed capillary changes and a sclerodactyly was noted.  She was using hand warmers in the office.  We discussed use of amlodipine at the last visit which she declined.  She does not want to take any medications.  She has been experiencing pain and stiffness in her hands.  No synovitis was noted.  She denies any history of digital ulcers, shortness of breath, palpitations, gastroesophageal reflux.  Patient has a follow-up appointment coming up with Dr. Isaiah Serge.  She has not seen Dr. Gala Romney on in the last since October 26, 2019.  She was advised to schedule an appointment with Dr. Gala Romney.  Annual echocardiogram was recommended.  She has been getting PFTs with Dr. Isaiah Serge every 6 months.  She was seen by Dr. Isaiah Serge on March 12, 2022.  I reviewed the note.  Her findings were consistent with COPD.  There was no evidence of ILD on the high-resolution CT done in May 2023.  She was also advised to monitor blood pressure closely at home.  Increased risk of renal crisis and systemic sclerosis was discussed.  Plan: CBC with Differential/Platelet, COMPLETE METABOLIC PANEL WITH GFR, Urinalysis, Routine w reflex microscopic  Raynaud's disease without gangrene-she has severe Raynauds.  She was using hand  warmers in the office.  She does not want to take amlodipine.  There was no digital ulcers.  Primary osteoarthritis of both hands-she has mild PIP and DIP thickening.  She continues to have some stiffness in her hands.  She takes Tylenol as needed.  Joint protection was discussed.  X-rays  obtained at the last visit showed osteoarthritic changes.  Pain in both feet-she gives history of joint discomfort in her feet.  She denies any history of joint swelling.  Proper fitting shoes were advised.  X-rays obtained at the last visit showed osteoarthritic changes.  Pseudogout - Diagnosed by Dr. Lanell Matar in the past.  Former smoker -patient quit smoking.  She smoked 3/4 PPD x 35 years. Lung cancer screening chest CT updated on 09/17/20: benign appearance or behavior. Annual chest CT recommended by Dr. Isaiah Serge.  History of COPD - Diagnosed as moderate COPD by Dr. Isaiah Serge.  History of osteoporosis - Declined tx in the past.  Last DEXA in epic on 11/17/12.  Patient does not wish to have DEXA scan at this point.  History of vertebral compression fracture - Old, stable L4 compression fracture unchanged on x-rays of lumbar spine on 04/15/20.  Anxiety-patient states that her anxiety has increased recently.  She is applying for disability.  Other medical problems are listed as follows:  Malignant neoplasm of upper-outer quadrant of left female breast, unspecified estrogen receptor status (HCC)  History of migraine  Orders: Orders Placed This Encounter  Procedures   CBC with Differential/Platelet   COMPLETE METABOLIC PANEL WITH GFR   Urinalysis, Routine w reflex microscopic   No orders of the defined types were placed in this encounter.    Follow-Up Instructions: Return in about 6 months (around 03/05/2023) for Scleroderma.   Pollyann Savoy, MD  Note - This record has been created using Animal nutritionist.  Chart creation errors have been sought, but may not always  have been located. Such creation  errors do not reflect on  the standard of medical care.

## 2022-09-02 ENCOUNTER — Encounter: Payer: Self-pay | Admitting: Rheumatology

## 2022-09-02 ENCOUNTER — Ambulatory Visit: Payer: Commercial Managed Care - PPO | Attending: Rheumatology | Admitting: Rheumatology

## 2022-09-02 VITALS — BP 122/76 | HR 80 | Resp 16 | Ht 65.0 in | Wt 139.2 lb

## 2022-09-02 DIAGNOSIS — I7300 Raynaud's syndrome without gangrene: Secondary | ICD-10-CM

## 2022-09-02 DIAGNOSIS — Z8781 Personal history of (healed) traumatic fracture: Secondary | ICD-10-CM

## 2022-09-02 DIAGNOSIS — M79672 Pain in left foot: Secondary | ICD-10-CM

## 2022-09-02 DIAGNOSIS — Z87891 Personal history of nicotine dependence: Secondary | ICD-10-CM

## 2022-09-02 DIAGNOSIS — M19041 Primary osteoarthritis, right hand: Secondary | ICD-10-CM | POA: Diagnosis not present

## 2022-09-02 DIAGNOSIS — Z8709 Personal history of other diseases of the respiratory system: Secondary | ICD-10-CM

## 2022-09-02 DIAGNOSIS — F17200 Nicotine dependence, unspecified, uncomplicated: Secondary | ICD-10-CM

## 2022-09-02 DIAGNOSIS — M349 Systemic sclerosis, unspecified: Secondary | ICD-10-CM | POA: Diagnosis not present

## 2022-09-02 DIAGNOSIS — C50412 Malignant neoplasm of upper-outer quadrant of left female breast: Secondary | ICD-10-CM

## 2022-09-02 DIAGNOSIS — M79671 Pain in right foot: Secondary | ICD-10-CM | POA: Diagnosis not present

## 2022-09-02 DIAGNOSIS — Z8739 Personal history of other diseases of the musculoskeletal system and connective tissue: Secondary | ICD-10-CM

## 2022-09-02 DIAGNOSIS — Z8669 Personal history of other diseases of the nervous system and sense organs: Secondary | ICD-10-CM

## 2022-09-02 DIAGNOSIS — M19042 Primary osteoarthritis, left hand: Secondary | ICD-10-CM

## 2022-09-02 DIAGNOSIS — M1120 Other chondrocalcinosis, unspecified site: Secondary | ICD-10-CM

## 2022-09-02 DIAGNOSIS — F419 Anxiety disorder, unspecified: Secondary | ICD-10-CM

## 2022-09-02 NOTE — Patient Instructions (Signed)
Please schedule an appointment with Dr. Arvilla Meres [phone]

## 2022-09-03 LAB — COMPLETE METABOLIC PANEL WITH GFR
AG Ratio: 2 (calc) (ref 1.0–2.5)
ALT: 13 U/L (ref 6–29)
AST: 14 U/L (ref 10–35)
Albumin: 4.2 g/dL (ref 3.6–5.1)
Alkaline phosphatase (APISO): 92 U/L (ref 37–153)
BUN: 14 mg/dL (ref 7–25)
CO2: 30 mmol/L (ref 20–32)
Calcium: 9.1 mg/dL (ref 8.6–10.4)
Chloride: 100 mmol/L (ref 98–110)
Creat: 0.82 mg/dL (ref 0.50–1.05)
Globulin: 2.1 g/dL (calc) (ref 1.9–3.7)
Glucose, Bld: 97 mg/dL (ref 65–99)
Potassium: 4.4 mmol/L (ref 3.5–5.3)
Sodium: 140 mmol/L (ref 135–146)
Total Bilirubin: 0.4 mg/dL (ref 0.2–1.2)
Total Protein: 6.3 g/dL (ref 6.1–8.1)
eGFR: 80 mL/min/{1.73_m2} (ref >=60)

## 2022-09-03 LAB — URINALYSIS, ROUTINE W REFLEX MICROSCOPIC
Bilirubin Urine: NEGATIVE
Glucose, UA: NEGATIVE
Hgb urine dipstick: NEGATIVE
Ketones, ur: NEGATIVE
Leukocytes,Ua: NEGATIVE
Nitrite: NEGATIVE
Protein, ur: NEGATIVE
Specific Gravity, Urine: 1.01 (ref 1.001–1.035)
pH: 7.5 (ref 5.0–8.0)

## 2022-09-03 LAB — CBC WITH DIFFERENTIAL/PLATELET
Absolute Monocytes: 531 cells/uL (ref 200–950)
Basophils Absolute: 43 cells/uL (ref 0–200)
Basophils Relative: 0.7 %
Eosinophils Absolute: 98 cells/uL (ref 15–500)
Eosinophils Relative: 1.6 %
HCT: 37.7 % (ref 35.0–45.0)
Hemoglobin: 12.5 g/dL (ref 11.7–15.5)
Lymphs Abs: 1025 cells/uL (ref 850–3900)
MCH: 29.3 pg (ref 27.0–33.0)
MCHC: 33.2 g/dL (ref 32.0–36.0)
MCV: 88.5 fL (ref 80.0–100.0)
MPV: 9.3 fL (ref 7.5–12.5)
Monocytes Relative: 8.7 %
Neutro Abs: 4404 cells/uL (ref 1500–7800)
Neutrophils Relative %: 72.2 %
Platelets: 188 10*3/uL (ref 140–400)
RBC: 4.26 10*6/uL (ref 3.80–5.10)
RDW: 12.5 % (ref 11.0–15.0)
Total Lymphocyte: 16.8 %
WBC: 6.1 10*3/uL (ref 3.8–10.8)

## 2022-09-03 NOTE — Progress Notes (Signed)
CBC, CMP and UA are within normal limits

## 2022-09-23 ENCOUNTER — Encounter: Payer: Self-pay | Admitting: Pulmonary Disease

## 2022-09-23 ENCOUNTER — Ambulatory Visit (INDEPENDENT_AMBULATORY_CARE_PROVIDER_SITE_OTHER): Payer: Commercial Managed Care - PPO | Admitting: Pulmonary Disease

## 2022-09-23 VITALS — BP 108/62 | HR 76 | Temp 98.0°F | Ht 65.0 in | Wt 138.0 lb

## 2022-09-23 DIAGNOSIS — J42 Unspecified chronic bronchitis: Secondary | ICD-10-CM | POA: Diagnosis not present

## 2022-09-23 DIAGNOSIS — F17200 Nicotine dependence, unspecified, uncomplicated: Secondary | ICD-10-CM

## 2022-09-23 LAB — PULMONARY FUNCTION TEST
DL/VA % pred: 77 %
DL/VA: 3.23 ml/min/mmHg/L
DLCO cor % pred: 53 %
DLCO cor: 11.14 ml/min/mmHg
DLCO unc % pred: 52 %
DLCO unc: 10.82 ml/min/mmHg
FEF 25-75 Post: 0.71 L/sec
FEF 25-75 Pre: 0.49 L/sec
FEF2575-%Change-Post: 44 %
FEF2575-%Pred-Post: 31 %
FEF2575-%Pred-Pre: 21 %
FEV1-%Change-Post: 12 %
FEV1-%Pred-Post: 48 %
FEV1-%Pred-Pre: 42 %
FEV1-Post: 1.25 L
FEV1-Pre: 1.1 L
FEV1FVC-%Change-Post: 0 %
FEV1FVC-%Pred-Pre: 71 %
FEV6-%Change-Post: 12 %
FEV6-%Pred-Post: 67 %
FEV6-%Pred-Pre: 59 %
FEV6-Post: 2.16 L
FEV6-Pre: 1.92 L
FEV6FVC-%Change-Post: 0 %
FEV6FVC-%Pred-Post: 100 %
FEV6FVC-%Pred-Pre: 100 %
FVC-%Change-Post: 11 %
FVC-%Pred-Post: 66 %
FVC-%Pred-Pre: 59 %
FVC-Post: 2.23 L
FVC-Pre: 1.99 L
Post FEV1/FVC ratio: 56 %
Post FEV6/FVC ratio: 97 %
Pre FEV1/FVC ratio: 55 %
Pre FEV6/FVC Ratio: 97 %
RV % pred: 173 %
RV: 3.64 L
TLC % pred: 107 %
TLC: 5.59 L

## 2022-09-23 MED ORDER — AEROCHAMBER MV MISC: 0 refills | Status: DC

## 2022-09-23 NOTE — Addendum Note (Signed)
Addended by: Wyvonne Lenz on: 09/23/2022 01:33 PM   Modules accepted: Orders

## 2022-09-23 NOTE — Progress Notes (Signed)
Doris Bender    [id number]    January 10, 1961  Primary Care Physician:Daniel, Lucita Lora, MD  Referring Physician: Richardean Chimera, MD [address] [place],  [place] [address]  Chief complaint: Follow-up for dyspnea, COPD, evaluation for interstitial lung disease  HPI: Active smoker with history of limited systemic sclerosis, Raynaud's, pseudogout, COPD, breast cancer  Follows with Dr. Corliss Skains.  On amlodipine for Raynaud's syndrome.  Not on any immunosuppressive therapy She has previously been on steroids and Plaquenil which was stopped due to side effects Has annual echocardiogram and follow-up with Dr. Gala Romney.  No evidence of pulmonary hypertension on prior studies.  Denies any dyspnea, she is on albuterol which she uses very rarely No cough, sputum production, fevers, chills  PFTs were ordered this year at [facility] but she did not want to get them done as there was a requirement for COVID testing  Pets: Outside dog Occupation: Retired Child psychotherapist Exposures: Used to have a down pillow until 2021.  No ongoing exposures.  No mold, hot tub, Jacuzzi.  No feather pillows or comforters Smoking history: 40-pack-year smoker.  Continues to smoke half pack per day Travel history: No significant travel history Relevant family history: No family history of lung disease  Interim history: She is here for further interval of 1 year for reassessment.  Continues follow-up with rheumatology for scleroderma and is not on any particular treatment.  She was started on Trelegy at last visit but she did not like it and is just using albuterol as needed.  States that breathing is doing well and does not want any controller medication.   She finally quit smoking a few years ago.  Outpatient Encounter Medications as of 09/23/2022  Medication Sig   Acetaminophen (TYLENOL ARTHRITIS PAIN PO) Take by mouth as needed.   albuterol (VENTOLIN HFA) 108 (90 Base) MCG/ACT inhaler as needed.    ALPRAZolam (XANAX) 0.25 MG tablet Take 0.25 mg by mouth at bedtime as needed.   amoxicillin (AMOXIL) 500 MG tablet Take 500 mg by mouth 3 (three) times daily.   Fluticasone-Umeclidin-Vilant (TRELEGY ELLIPTA) 200-62.5-25 MCG/ACT AEPB Inhale 1 puff into the lungs daily.   HYDROcodone-acetaminophen (NORCO/VICODIN) 5-325 MG tablet Take 1 tablet by mouth every 6 (six) hours as needed for moderate pain.   No facility-administered encounter medications on file as of 09/23/2022.    Physical Exam: Blood pressure 108/62, pulse 76, temperature 98 F (36.7 C), temperature source Oral, height 5\' 5"  (1.651 m), weight 138 lb (62.6 kg), SpO2 100 %. 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  Stable lung nodules. Screening CT chest 09/17/2021-moderate emphysema, stable lung nodules.  I have reviewed the images personally. I have reviewed  the images personally.  PFTs: 10/30/19 FVC 2.66 [77%], FEV1 1.65 [62%], F/F 62, TLC 5.37 [103%], DLCO 13.77 [65%] Moderate-severe obstructive airway disease, air trapping, mild diffusion defect  Labs:  Cardiac: Echocardiogram 10/26/2019-LVEF 60 to 65%, normal PA systolic pressure  Assessment:  Evaluation for interstitial lung disease She has history of  limited sclerosis, Raynaud's Prior high-res CT and recent screening CT in May 2023 reviewed with no evidence of interstitial lung disease Continue monitoring on annual CTs of the chest.  COPD Has moderate COPD on PFTs She does not want any controller medication as she is asymptomatic, continue albuterol as needed  Active smoker Congratulated on quitting smoking  Plan/Recommendations: Continue albuterol as needed Follow-up in 6 months with PFTs.  Chilton Greathouse MD [facility] 09/23/2022, 1:15 PM  CC: Richardean Chimera, MD

## 2022-09-23 NOTE — Patient Instructions (Signed)
Full PFT performed today. °

## 2022-09-23 NOTE — Progress Notes (Signed)
Full PFT performed today. °

## 2022-09-23 NOTE — Patient Instructions (Signed)
I am glad you are doing well with regard to breathing Your PFTs do show severe COPD.  Congratulations on smoking Continue annual CTs of the chest Follow-up in 1 year

## 2022-09-24 ENCOUNTER — Ambulatory Visit (HOSPITAL_COMMUNITY)
Admission: RE | Admit: 2022-09-24 | Discharge: 2022-09-24 | Disposition: A | Discharge status: Home / Self Care | Payer: Commercial Managed Care - PPO | Source: Ambulatory Visit | Attending: Acute Care | Admitting: Acute Care

## 2022-09-24 DIAGNOSIS — Z122 Encounter for screening for malignant neoplasm of respiratory organs: Secondary | ICD-10-CM | POA: Diagnosis not present

## 2022-09-24 DIAGNOSIS — Z87891 Personal history of nicotine dependence: Secondary | ICD-10-CM | POA: Diagnosis present

## 2022-09-24 DIAGNOSIS — I2510 Atherosclerotic heart disease of native coronary artery without angina pectoris: Secondary | ICD-10-CM | POA: Insufficient documentation

## 2022-09-24 DIAGNOSIS — J432 Centrilobular emphysema: Secondary | ICD-10-CM | POA: Diagnosis not present

## 2022-09-24 DIAGNOSIS — I700 Atherosclerosis of aorta: Secondary | ICD-10-CM | POA: Insufficient documentation

## 2022-09-24 DIAGNOSIS — F17210 Nicotine dependence, cigarettes, uncomplicated: Secondary | ICD-10-CM

## 2022-10-02 ENCOUNTER — Other Ambulatory Visit: Payer: Self-pay

## 2022-10-02 DIAGNOSIS — Z122 Encounter for screening for malignant neoplasm of respiratory organs: Secondary | ICD-10-CM

## 2022-10-02 DIAGNOSIS — Z87891 Personal history of nicotine dependence: Secondary | ICD-10-CM

## 2022-10-06 ENCOUNTER — Telehealth: Payer: Self-pay | Admitting: Pulmonary Disease

## 2022-10-06 NOTE — Telephone Encounter (Signed)
Fax received from [facility] sure of dentist.  Patient needs surgery clearance for extracting 11 teeth and placing 6 dental implants  Surgery is Pending . Patient was seen on 09/23/22. Office protocol is a risk assessment can be sent to surgeon if patient has been seen in 60 days or less.   Sending to Dr. Isaiah Serge for risk assessment or recommendations if patient needs to be seen in office prior to surgical procedure.    Need to know if there are any precautions

## 2022-10-14 ENCOUNTER — Encounter: Payer: Self-pay | Admitting: Pulmonary Disease

## 2022-10-14 NOTE — Progress Notes (Signed)
PCCM note-preoperative pulmonary evaluation  Received request for preoperative pulmonary evaluation. Patient needs surgery clearance for extracting 11 teeth and placing 6 dental implants   Patient was seen in clinic on 09/23/2022 for moderate COPD.  Symptoms are stable on inhaler therapy.  She quit smoking a few years ago and is not on supplemental oxygen or regular inhaler therapy.  Overall she is at low risk for perioperative pulmonary issues. Per [employer] preoperative pulmonary risk calculator she has risk of 1.6% pulmonary complication rate.  No pulmonary contraindication to surgery  Peri-operative Assessment of Pulmonary Risk for Non-Thoracic Surgery:  ForMs. Woodmansee, risk of perioperative pulmonary complications is increased by:  COPD   Respiratory complications generally occur in 1% of ASA Class I patients, 5% of ASA Class II and 10% of ASA Class III-IV patients These complications rarely result in mortality and iclude postoperative pneumonia, atelectasis, pulmonary embolism, ARDS and increased time requiring postoperative mechanical ventilation.  Overall, I recommend proceeding with the surgery if the risk for respiratory complications are outweighed by the potential benefits. This will need to be discussed between the patient and surgeon.  To reduce risks of respiratory complications, I recommend: --Pre- and post-operative incentive spirometry performed frequently while awake --Avoiding use of pancuronium during anesthesia.  Chilton Greathouse MD  Pulmonary & Critical care See Amion for pager  If no response to pager , please call [phone] until 7pm After 7:00 pm call Elink  [phone] 10/14/2022, 8:06 PM

## 2022-10-14 NOTE — Telephone Encounter (Signed)
Risk assessment done.  See separate note.

## 2022-10-15 NOTE — Telephone Encounter (Signed)
OV notes and clearance form have been faxed back to [facility]. Nothing further needed at this time.

## 2022-10-20 ENCOUNTER — Telehealth: Payer: Self-pay | Admitting: Rheumatology

## 2022-10-20 NOTE — Telephone Encounter (Signed)
Patient advised we have not received fax from [facility].

## 2022-10-20 NOTE — Telephone Encounter (Signed)
Pt called requesting papers faxed over to [facility] for approval on surgery Monday 10/26/22.

## 2022-10-21 ENCOUNTER — Encounter: Payer: Self-pay | Admitting: *Deleted

## 2022-10-21 ENCOUNTER — Telehealth: Payer: Self-pay | Admitting: Rheumatology

## 2022-10-21 NOTE — Telephone Encounter (Signed)
Received fax from [facility] asking for clearance due to patient having a history of COPD and breast cancer. Faxed the form back advising we are patient's rheumatologist and we do not have patient on any medication. Advised they reach out to PCP for clearance.

## 2022-10-21 NOTE — Progress Notes (Signed)
Received dental clearance from [facility].  Pt last seen in our office in 2016 and unable to advise at this time.  Form faxed back with request to obtain clearance from pt PCP.

## 2022-10-21 NOTE — Telephone Encounter (Signed)
Paperwork has not been received in our office at this time. Patient advised yesterday.

## 2022-10-21 NOTE — Telephone Encounter (Signed)
Pt called requesting approval for dental work at [facility] in [place]. Pt has called [facility] to fax over papers.

## 2022-11-06 NOTE — Progress Notes (Signed)
[facility] Consult Note   Referring Physician: PCP: Richardean Chimera, MD PCP-Cardiologist: None   Referring MD: Dr. Fatima Sanger   HPI:  Doris Bender is a 62 y.o. female with h/o systemic sclerosis, raynauds phenomenon, osteoporosis, rheumatoid arthritis, tobacco abuse, breast cancer (treated with lumpectomy but no chemo as she could not tolerate) and COPD/emphysema.  Pt has known systemic sclerosis and was sent to Dr. Kendrick Fries in 2019 for evaluation for lung involvement. PFTs and CT chest as below with emphysema/COPD. No evidence of ILD.   She presents today to f/u on her pulmonary hypertension screening in the setting of systemic sclerosis at the request of her Rheumatologist, Dr. Corliss Skains. We have not seen her since 2021.   Overall doing fairly well. Works [employer] for [employer]. Feels well. Mild DOE with going up and down steps. No change. No CP. Still smoking 0.5-1 PPD. Has been following with Dr. Isaiah Serge in Pulmonary.   Echo 9/19  EF 60-65% Normal RV Normal diastolic function. IVC small Echo 6/21 EF 60-65% RV normal.   PFTs 5/24 FVC 1.99 (59%)  FEV1 1.10 (42%) TLC 5.59 (107%) DLCO 11.14 (53%)  PFTs 11/16/17 FVC 2.48 (71%)  FEV1 1.48 (55%) TLC 5.28 (101%) DLCO 14.62 (57%)  CT Chest High Res 11/25/17 1. No evidence of interstitial lung disease. 2. Mild radiation fibrosis in the anterior left upper lobe. 3. Moderate centrilobular and mild paraseptal emphysema with mild diffuse bronchial wall thickening, compatible with the provided history of COPD. 4. One vessel coronary atherosclerosis.  Pertinent Labs 09/2017 positive ANA 1: 1280 centromere 09/2017 anticardiolipin IgM 29 (negative) 09/2017 SCL 70, RNP, Smith, SSA, SSB, dsDNA negative, lupus anticoagulant negative, beta-2 neg   Past Medical History:  Diagnosis Date   Arthritis    lt great toe (RA)   Breast cancer (HCC) 11/15/2012   left   Cancer (HCC)    breast   Compression  fracture of L4 lumbar vertebra 11/04/2012   injury   Contact lens/glasses fitting    wears contacts or glasses   Gout 8-9 yrs ago   Rt heel/foot   History of radiation therapy 02/20/13-04/07/13   left breast 50.4 gray, upper outer quadrant boosted to 62.4 gray   Osteoporosis    Pseudogout of joint of left foot 6 yrs ago   Raynaud's disease    bilateral hands and feet   Scleroderma (HCC)    Wears partial dentures    partial lower    Current Outpatient Medications  Medication Sig Dispense Refill   Acetaminophen (TYLENOL ARTHRITIS PAIN PO) Take by mouth as needed.     albuterol (VENTOLIN HFA) 108 (90 Base) MCG/ACT inhaler as needed.     ALPRAZolam (XANAX) 0.25 MG tablet Take 0.25 mg by mouth at bedtime as needed.  0   amoxicillin (AMOXIL) 500 MG tablet Take 500 mg by mouth 3 (three) times daily.     Fluticasone-Umeclidin-Vilant (TRELEGY ELLIPTA) 200-62.5-25 MCG/ACT AEPB Inhale 1 puff into the lungs daily. 14 each 0   HYDROcodone-acetaminophen (NORCO/VICODIN) 5-325 MG tablet Take 1 tablet by mouth every 6 (six) hours as needed for moderate pain. 20 tablet 0   Spacer/Aero-Holding Chambers (AEROCHAMBER MV) inhaler Use as instructed 1 each 0   No current facility-administered medications for this encounter.    Allergies  Allergen Reactions   Hydroxychloroquine Nausea And Vomiting and Other (See Comments)    Back, stomach/ abd pain.   Prednisone Nausea And Vomiting    Other reaction(s): Abdominal  Pain   Doxycycline     dizzy   Ibuprofen Hives   Keflex [Cephalexin] Hives   Ketorolac Hives   Lodine [Etodolac] Hives   Methocarbamol Nausea Only   Oxycodone Nausea And Vomiting   Oxycodone Nausea Only   Robaxin [Methocarbamol] Hives      Social History   Socioeconomic History   Marital status: Single    Spouse name: Not on file   Number of children: Not on file   Years of education: Not on file   Highest education level: Not on file  Occupational History   Not on file   Tobacco Use   Smoking status: Former    Packs/day: 0.75    Years: 50.00    Additional pack years: 0.00    Total pack years: 37.50    Types: Cigarettes    Quit date: 03/20/2022    Years since quitting: 0.6    Passive exposure: Never   Smokeless tobacco: Never  Vaping Use   Vaping Use: Never used  Substance and Sexual Activity   Alcohol use: Yes    Comment: occ   Drug use: No   Sexual activity: Not on file    Comment: menarche age 87, miscarriage x 1, P1,  age 78 w/birth of son, Depo-Provera x 20 yrs  Other Topics Concern   Not on file  Social History Narrative   ** Merged History Encounter **       Social Determinants of Health   Financial Resource Strain: Not on file  Food Insecurity: Not on file  Transportation Needs: Not on file  Physical Activity: Not on file  Stress: Not on file  Social Connections: Not on file  Intimate Partner Violence: Not on file      Family History  Problem Relation Age of Onset   Hyperthyroidism Mother    Cirrhosis Father    Diabetes Brother    Heart attack Maternal Grandmother    Heart attack Maternal Grandfather     There were no vitals filed for this visit.   Wt Readings from Last 3 Encounters:  09/23/22 62.6 kg (138 lb)  09/02/22 63.1 kg (139 lb 3.2 oz)  03/12/22 58 kg (127 lb 12.8 oz)    PHYSICAL EXAM: General:  Well appearing. No resp difficulty HEENT: normal Neck: supple. no JVD. Carotids 2+ bilat; no bruits. No lymphadenopathy or thryomegaly appreciated. Cor: PMI nondisplaced. Regular rate & rhythm. No rubs, gallops or murmurs. Lungs: mild reduced breath sounds throughout  Abdomen: soft, nontender, nondistended. No hepatosplenomegaly. No bruits or masses. Good bowel sounds. Extremities: no cyanosis, clubbing, rash, edema Neuro: alert & orientedx3, cranial nerves grossly intact. moves all 4 extremities w/o difficulty. Affect pleasant   ECG: NSR 78 LPFB. No ST-T wave abnormalities.  Personally reviewed   ASSESSMENT  & PLAN:  1. Pulmonary HTN screening in setting of connective tissue disease - Echo 9/19  EF 60-65% Normal RV Normal diastolic function. IVC small Echo 6/21 EF 60-65% RV normal. - PFTs with evidence of moderate to severe COPD and stable, moderately decreased diffusion defect. No evidence of ILD on hi-res CT of chst) - She is due for repeat echo  2. COPD - Follows with Pulmoanry - Discussed need for smoking cessation  3. Tobacco Abuse - Encouraged smoking cessation    Arvilla Meres, MD  2:08 PM

## 2022-11-09 ENCOUNTER — Ambulatory Visit (HOSPITAL_COMMUNITY)
Admission: RE | Admit: 2022-11-09 | Discharge: 2022-11-09 | Disposition: A | Discharge status: Home / Self Care | Payer: Commercial Managed Care - PPO | Source: Ambulatory Visit | Attending: Internal Medicine | Admitting: Internal Medicine

## 2022-11-09 ENCOUNTER — Encounter (HOSPITAL_COMMUNITY): Payer: Self-pay | Admitting: Internal Medicine

## 2022-11-09 VITALS — BP 104/60 | HR 90 | Wt 134.6 lb

## 2022-11-09 DIAGNOSIS — Z87891 Personal history of nicotine dependence: Secondary | ICD-10-CM | POA: Insufficient documentation

## 2022-11-09 DIAGNOSIS — C50412 Malignant neoplasm of upper-outer quadrant of left female breast: Secondary | ICD-10-CM | POA: Diagnosis not present

## 2022-11-09 DIAGNOSIS — I2721 Secondary pulmonary arterial hypertension: Secondary | ICD-10-CM | POA: Insufficient documentation

## 2022-11-09 DIAGNOSIS — M349 Systemic sclerosis, unspecified: Secondary | ICD-10-CM | POA: Insufficient documentation

## 2022-11-09 DIAGNOSIS — J432 Centrilobular emphysema: Secondary | ICD-10-CM | POA: Insufficient documentation

## 2022-11-09 NOTE — Patient Instructions (Signed)
Medication Changes:  No Changes In Medications at this time.    Testing/Procedures:  Your physician has requested that you have an echocardiogram. Echocardiography is a painless test that uses sound waves to create images of your heart. It provides your doctor with information about the size and shape of your heart and how well your heart's chambers and valves are working. You may receive an ultrasound enhancing agent through an IV if needed to better visualize your heart during the echo.This procedure takes approximately one hour. There are no restrictions for this procedure.   Follow-Up in: 82 MONTHS WITH Dr. Gala Romney- PLEASE CALL OUR OFFICE AROUND ND JUNE OF 2025 TO GET SCHEDULED FOR THIS. NUMBER TO OUR OFFICE IS [phone] OPTION 2  At the [facility], you and your health needs are our priority. We have a designated team specialized in the treatment of Heart Failure. This Care Team includes your primary Heart Failure Specialized Cardiologist (physician), Advanced Practice Providers (APPs- Physician Assistants and Nurse Practitioners), and Pharmacist who all work together to provide you with the care you need, when you need it.   You may see any of the following providers on your designated Care Team at your next follow up:  Dr. Arvilla Meres Dr. Marca Ancona Dr. Marcos Eke, NP Robbie Lis, [place] [facility] [place], [place] Brynda Peon, NP Karle Plumber, PharmD   Please be sure to bring in all your medications bottles to every appointment.   Need to Contact us:  If you have any questions or concerns before your next appointment please send [place] a message through [place] or call our office at [phone].    TO LEAVE A MESSAGE FOR THE NURSE SELECT OPTION 2, PLEASE LEAVE A MESSAGE INCLUDING: YOUR NAME DATE OF BIRTH CALL BACK NUMBER REASON FOR CALL**this is important as we prioritize the call backs  YOU WILL RECEIVE A CALL BACK  THE SAME DAY AS LONG AS YOU CALL BEFORE 4:00 PM

## 2022-11-09 NOTE — Progress Notes (Signed)
SATURATION QUALIFICATIONS: (This note is used to comply with regulatory documentation for home oxygen)  Patient Saturations on Room Air at Rest = 96%  Patient Saturations on Room Air while Ambulating = 84%  Patient Saturations on 2 Liters of oxygen while Ambulating = 94%  Please briefly explain why patient needs home oxygen:hypoxia heart failure

## 2022-12-28 ENCOUNTER — Ambulatory Visit (HOSPITAL_COMMUNITY)
Admission: RE | Admit: 2022-12-28 | Discharge: 2022-12-28 | Disposition: A | Discharge status: Home / Self Care | Payer: Commercial Managed Care - PPO | Source: Ambulatory Visit | Attending: Internal Medicine | Admitting: Internal Medicine

## 2022-12-28 DIAGNOSIS — I2721 Secondary pulmonary arterial hypertension: Secondary | ICD-10-CM

## 2022-12-28 LAB — ECHOCARDIOGRAM COMPLETE
Area-P 1/2: 2.73 cm2
S' Lateral: 2.7 cm
Single Plane A4C EF: 58.2 %

## 2023-02-13 ENCOUNTER — Encounter (HOSPITAL_COMMUNITY): Payer: Self-pay | Admitting: *Deleted

## 2023-02-13 ENCOUNTER — Other Ambulatory Visit: Payer: Self-pay

## 2023-02-13 ENCOUNTER — Emergency Department (HOSPITAL_COMMUNITY)
Admission: EM | Admit: 2023-02-13 | Discharge: 2023-02-13 | Disposition: A | Discharge status: Home / Self Care | Payer: Commercial Managed Care - PPO | Attending: Student | Admitting: Student

## 2023-02-13 ENCOUNTER — Emergency Department (HOSPITAL_COMMUNITY): Payer: Commercial Managed Care - PPO

## 2023-02-13 DIAGNOSIS — W231XXA Caught, crushed, jammed, or pinched between stationary objects, initial encounter: Secondary | ICD-10-CM | POA: Diagnosis not present

## 2023-02-13 DIAGNOSIS — S6991XA Unspecified injury of right wrist, hand and finger(s), initial encounter: Secondary | ICD-10-CM | POA: Diagnosis present

## 2023-02-13 DIAGNOSIS — Z87891 Personal history of nicotine dependence: Secondary | ICD-10-CM | POA: Insufficient documentation

## 2023-02-13 DIAGNOSIS — Z96659 Presence of unspecified artificial knee joint: Secondary | ICD-10-CM | POA: Diagnosis not present

## 2023-02-13 DIAGNOSIS — S62639A Displaced fracture of distal phalanx of unspecified finger, initial encounter for closed fracture: Secondary | ICD-10-CM

## 2023-02-13 DIAGNOSIS — J449 Chronic obstructive pulmonary disease, unspecified: Secondary | ICD-10-CM | POA: Insufficient documentation

## 2023-02-13 DIAGNOSIS — Z853 Personal history of malignant neoplasm of breast: Secondary | ICD-10-CM | POA: Diagnosis not present

## 2023-02-13 NOTE — ED Triage Notes (Signed)
Here by POV from home for R little finger injury. Pain, swelling bruising noted to th digit PIP, as well as some swelling to 5th MC. Onset PTA while washing car, states windshield wiper blade rammed forcefully b/w 4&5th digits. Sent from [facility] b/c they do not have xray this am. Alert, NAD, calm, interactive.

## 2023-02-13 NOTE — ED Notes (Signed)
Ice provided to patient

## 2023-02-13 NOTE — ED Provider Notes (Signed)
[place] EMERGENCY DEPARTMENT AT [facility] Provider Note  CSN: [id number] Arrival date & time: 02/13/23 8841  Chief Complaint(s) Finger Injury  HPI Doris Bender is a 62 y.o. female who presents Emergency Department for evaluation of a right pinky injury.  The patient states that she was changing the windshield wiper's on her car when she stubbed her pinky on the windshield wiper.  Occurred at 6 PM yesterday.  Awoke with persistent swelling and pain.  Denies numbness, ting, weakness of the hand.  Pulses intact.  No additional traumatic complaints.   Past Medical History Past Medical History:  Diagnosis Date   Arthritis    lt great toe (RA)   Breast cancer (HCC) 11/15/2012   left   Cancer (HCC)    breast   Compression fracture of L4 lumbar vertebra 11/04/2012   injury   Contact lens/glasses fitting    wears contacts or glasses   Gout 8-9 yrs ago   Rt heel/foot   History of radiation therapy 02/20/13-04/07/13   left breast 50.4 gray, upper outer quadrant boosted to 62.4 gray   Osteoporosis    Pseudogout of joint of left foot 6 yrs ago   Raynaud's disease    bilateral hands and feet   Scleroderma (HCC)    Wears partial dentures    partial lower   Patient Active Problem List   Diagnosis Date Noted   COPD (chronic obstructive pulmonary disease) (HCC) 09/11/2019   Smoker 09/24/2017   Primary osteoarthritis of both hands 09/24/2017   Autoimmune disease (HCC) 09/24/2017   History of osteoporosis 09/13/2017   History of vertebral compression fracture 09/13/2017   Raynaud's syndrome without gangrene 09/13/2017   Pseudogout 09/13/2017   History of migraine 09/13/2017   Left lateral epicondylitis 07/24/2015   Back pain 07/24/2013   Compression fracture of L4 vertebra (HCC) 07/24/2013   ANA positive 05/31/2013   Osteoporosis 05/31/2013   Raynaud phenomenon 05/31/2013   Breast cancer (HCC) 05/30/2013   Cancer (HCC) 05/04/2013   Breast cancer of upper-outer  quadrant of left female breast (HCC) 10/18/2012   Home Medication(s) Prior to Admission medications   Medication Sig Start Date End Date Taking? Authorizing Provider  Acetaminophen (TYLENOL ARTHRITIS PAIN PO) Take by mouth as needed.    [provider]  albuterol (VENTOLIN HFA) 108 (90 Base) MCG/ACT inhaler as needed. 10/08/21   [provider]  ALPRAZolam Prudy Feeler) 0.25 MG tablet Take 0.25 mg by mouth as needed for anxiety.    [provider]  amoxicillin (AMOXIL) 500 MG tablet Take 500 mg by mouth 3 (three) times daily. 08/31/22   [provider]  HYDROcodone-acetaminophen (NORCO/VICODIN) 5-325 MG tablet Take 1 tablet by mouth every 6 (six) hours as needed for moderate pain. 12/13/18   Hilts, Casimiro Needle, MD  Past Surgical History Past Surgical History:  Procedure Laterality Date   BREAST LUMPECTOMY WITH NEEDLE LOCALIZATION AND AXILLARY SENTINEL LYMPH NODE BX Left 12/21/2012   Procedure: BREAST LUMPECTOMY WITH NEEDLE LOCALIZATION AND AXILLARY SENTINEL LYMPH NODE BX;  Surgeon: Currie Paris, MD;  Location: [facility];  Service: General;  Laterality: Left;  needle local BCG     BREAST SURGERY  age 8   left breast bx- benign   CERVICAL CONIZATION W/BX  2012   benign   ELBOW SURGERY     KNEE ARTHROPLASTY     KNEE ARTHROSCOPY  2006   right   LATERAL EPICONDYLE RELEASE  03/08/2012   Procedure: TENNIS ELBOW RELEASE;  Surgeon: Wyn Forster., MD;  Location: [facility];  Service: Orthopedics;  Laterality: Right;  RECONSTRUCTION OF RIGHT LATERAL ELBOW WITH TENDONS   MASS EXCISION  03/08/2012   Procedure: EXCISION MASS;  Surgeon: Wyn Forster., MD;  Location: [facility];  Service: Orthopedics;  Laterality: Right;  EXCISION OF RIGHT WRIST DORSAL CYST   TENNIS ELBOW RELEASE/NIRSCHEL  PROCEDURE Left 09/12/2015   Procedure: ARTHROTOMY LEFT ELBOW, REPAIR EXTENSOR ORIGIN;  Surgeon: Cindee Salt, MD;  Location: [facility];  Service: Orthopedics;  Laterality: Left;   TONSILLECTOMY     TONSILLECTOMY     62yrs old   WRIST GANGLION EXCISION     left x2   Family History Family History  Problem Relation Age of Onset   Hyperthyroidism Mother    Cirrhosis Father    Diabetes Brother    Heart attack Maternal Grandmother    Heart attack Maternal Grandfather     Social History Social History   Tobacco Use   Smoking status: Former    Current packs/day: 0.00    Average packs/day: 0.8 packs/day for 50.0 years (37.5 ttl pk-yrs)    Types: Cigarettes    Start date: 03/20/1972    Quit date: 03/20/2022    Years since quitting: 0.9    Passive exposure: Never   Smokeless tobacco: Never  Vaping Use   Vaping status: Never Used  Substance Use Topics   Alcohol use: Yes    Comment: occ   Drug use: No   Allergies Hydroxychloroquine, Prednisone, Doxycycline, Ibuprofen, Keflex [cephalexin], Ketorolac, Lodine [etodolac], Methocarbamol, Oxycodone, Oxycodone, and Robaxin [methocarbamol]  Review of Systems Review of Systems  Musculoskeletal:  Positive for joint swelling.    Physical Exam Vital Signs  I have reviewed the triage vital signs BP 138/68 (BP Location: Right Arm)   Pulse 86   Temp 98.3 F (36.8 C) (Oral)   Resp 17   Ht 5\' 5"  (1.651 m)   Wt 60.3 kg   SpO2 100%   BMI 22.13 kg/m   Physical Exam Vitals and nursing note reviewed.  Constitutional:      General: She is not in acute distress.    Appearance: She is well-developed.  HENT:     Head: Normocephalic and atraumatic.  Eyes:     Conjunctiva/sclera: Conjunctivae normal.  Cardiovascular:     Rate and Rhythm: Normal rate and regular rhythm.     Heart sounds: No murmur heard. Pulmonary:     Effort: Pulmonary effort is normal. No respiratory distress.     Breath sounds: Normal breath sounds.   Abdominal:     Palpations: Abdomen is soft.     Tenderness: There is no abdominal tenderness.  Musculoskeletal:        General: Tenderness present.  No swelling.     Cervical back: Neck supple.  Skin:    General: Skin is warm and dry.     Capillary Refill: Capillary refill takes less than 2 seconds.     Findings: Erythema present.  Neurological:     Mental Status: She is alert.  Psychiatric:        Mood and Affect: Mood normal.     ED Results and Treatments Labs (all labs ordered are listed, but only abnormal results are displayed) Labs Reviewed - No data to display                                                                                                                        Radiology No results found.  Pertinent labs & imaging results that were available during my care of the patient were reviewed by me and considered in my medical decision making (see MDM for details).  Medications Ordered in ED Medications - No data to display                                                                                                                                   Procedures Procedures  (including critical care time)  Medical Decision Making / ED Course   This patient presents to the ED for concern of finger pain, this involves an extensive number of treatment options, and is a complaint that carries with it a high risk of complications and morbidity.  The differential diagnosis includes fracture, sprain, ligamentous injury, hematoma, contusion  MDM: Patient seen emergency room for evaluation of finger pain.  Physical exam with erythema and swelling to the distal tip of the right fifth digit on the hand.  X-ray imaging with a tuft fracture at the pinky.  Finger splint placed and patient will need to follow-up outpatient with orthopedic hand.  At this time she does not meet inpatient criteria for admission she is safe for discharge with outpatient follow-up.  No evidence of  open fracture today.   Additional history obtained:  -External records from outside source obtained and reviewed including: Chart review including previous notes, labs, imaging, consultation notes   Imaging Studies ordered: I ordered imaging studies including hand x-ray I independently visualized and interpreted imaging. I agree with the radiologist interpretation   Medicines ordered and prescription drug management: No orders of the defined types were placed in this encounter.   -I  have reviewed the patients home medicines and have made adjustments as needed  Critical interventions none  Social Determinants of Health:  Factors impacting patients care include: none   Reevaluation: After the interventions noted above, I reevaluated the patient and found that they have :improved  Co morbidities that complicate the patient evaluation  Past Medical History:  Diagnosis Date   Arthritis    lt great toe (RA)   Breast cancer (HCC) 11/15/2012   left   Cancer (HCC)    breast   Compression fracture of L4 lumbar vertebra 11/04/2012   injury   Contact lens/glasses fitting    wears contacts or glasses   Gout 8-9 yrs ago   Rt heel/foot   History of radiation therapy 02/20/13-04/07/13   left breast 50.4 gray, upper outer quadrant boosted to 62.4 gray   Osteoporosis    Pseudogout of joint of left foot 6 yrs ago   Raynaud's disease    bilateral hands and feet   Scleroderma (HCC)    Wears partial dentures    partial lower      Dispostion: I considered admission for this patient, but at this time she does not meet inpatient criteria and is here for discharge with outpatient follow-up.     Final Clinical Impression(s) / ED Diagnoses Final diagnoses:  None     @PCDICTATION @    Glendora Score, MD 02/13/23 1016

## 2023-02-19 NOTE — Progress Notes (Deleted)
Office Visit Note  Patient: Doris Bender             Date of Birth: 1961/02/13           MRN: [id number]             PCP: Richardean Chimera, MD Referring: Richardean Chimera, MD Visit Date: 03/05/2023 Occupation: @GUAROCC @  Subjective:  No chief complaint on file.   History of Present Illness: Doris Bender is a 62 y.o. female ***     Activities of Daily Living:  Patient reports morning stiffness for *** {minute/hour:19697}.   Patient {ACTIONS;DENIES/REPORTS:21021675::"Denies"} nocturnal pain.  Difficulty dressing/grooming: {ACTIONS;DENIES/REPORTS:21021675::"Denies"} Difficulty climbing stairs: {ACTIONS;DENIES/REPORTS:21021675::"Denies"} Difficulty getting out of chair: {ACTIONS;DENIES/REPORTS:21021675::"Denies"} Difficulty using hands for taps, buttons, cutlery, and/or writing: {ACTIONS;DENIES/REPORTS:21021675::"Denies"}  No Rheumatology ROS completed.   PMFS History:  Patient Active Problem List   Diagnosis Date Noted   COPD (chronic obstructive pulmonary disease) (HCC) 09/11/2019   Smoker 09/24/2017   Primary osteoarthritis of both hands 09/24/2017   Autoimmune disease (HCC) 09/24/2017   History of osteoporosis 09/13/2017   History of vertebral compression fracture 09/13/2017   Raynaud's syndrome without gangrene 09/13/2017   Pseudogout 09/13/2017   History of migraine 09/13/2017   Left lateral epicondylitis 07/24/2015   Back pain 07/24/2013   Compression fracture of L4 vertebra (HCC) 07/24/2013   ANA positive 05/31/2013   Osteoporosis 05/31/2013   Raynaud phenomenon 05/31/2013   Breast cancer (HCC) 05/30/2013   Cancer (HCC) 05/04/2013   Breast cancer of upper-outer quadrant of left female breast (HCC) 10/18/2012    Past Medical History:  Diagnosis Date   Arthritis    lt great toe (RA)   Breast cancer (HCC) 11/15/2012   left   Cancer (HCC)    breast   Compression fracture of L4 lumbar vertebra 11/04/2012   injury   Contact lens/glasses fitting     wears contacts or glasses   Gout 8-9 yrs ago   Rt heel/foot   History of radiation therapy 02/20/13-04/07/13   left breast 50.4 gray, upper outer quadrant boosted to 62.4 gray   Osteoporosis    Pseudogout of joint of left foot 6 yrs ago   Raynaud's disease    bilateral hands and feet   Scleroderma (HCC)    Wears partial dentures    partial lower    Family History  Problem Relation Age of Onset   Hyperthyroidism Mother    Cirrhosis Father    Diabetes Brother    Heart attack Maternal Grandmother    Heart attack Maternal Grandfather    Past Surgical History:  Procedure Laterality Date   BREAST LUMPECTOMY WITH NEEDLE LOCALIZATION AND AXILLARY SENTINEL LYMPH NODE BX Left 12/21/2012   Procedure: BREAST LUMPECTOMY WITH NEEDLE LOCALIZATION AND AXILLARY SENTINEL LYMPH NODE BX;  Surgeon: Currie Paris, MD;  Location: [facility];  Service: General;  Laterality: Left;  needle local BCG     BREAST SURGERY  age 58   left breast bx- benign   CERVICAL CONIZATION W/BX  2012   benign   ELBOW SURGERY     KNEE ARTHROPLASTY     KNEE ARTHROSCOPY  2006   right   LATERAL EPICONDYLE RELEASE  03/08/2012   Procedure: TENNIS ELBOW RELEASE;  Surgeon: Wyn Forster., MD;  Location: [facility];  Service: Orthopedics;  Laterality: Right;  RECONSTRUCTION OF RIGHT LATERAL ELBOW WITH TENDONS   MASS EXCISION  03/08/2012   Procedure: EXCISION MASS;  Surgeon: Molly Maduro  Glenice Bow., MD;  Location: [facility];  Service: Orthopedics;  Laterality: Right;  EXCISION OF RIGHT WRIST DORSAL CYST   TENNIS ELBOW RELEASE/NIRSCHEL PROCEDURE Left 09/12/2015   Procedure: ARTHROTOMY LEFT ELBOW, REPAIR EXTENSOR ORIGIN;  Surgeon: Cindee Salt, MD;  Location: [facility];  Service: Orthopedics;  Laterality: Left;   TONSILLECTOMY     TONSILLECTOMY     62yrs old   WRIST GANGLION EXCISION     left x2   Social History   Social History Narrative   ** Merged History  Encounter **        There is no immunization history on file for this patient.   Objective: Vital Signs: There were no vitals taken for this visit.   Physical Exam   Musculoskeletal Exam: ***  CDAI Exam: CDAI Score: -- Patient Global: --; Provider Global: -- Swollen: --; Tender: -- Joint Exam 03/05/2023   No joint exam has been documented for this visit   There is currently no information documented on the homunculus. Go to the Rheumatology activity and complete the homunculus joint exam.  Investigation: No additional findings.  Imaging: DG Hand Complete Right  Result Date: 02/13/2023 CLINICAL DATA:  Right small finger injury with pain, swelling, bruising. EXAM: RIGHT HAND - COMPLETE 3+ VIEW COMPARISON:  Right hand x-rays dated February 04, 2021. FINDINGS: Acute avulsion fracture at the dorsal base of the distal phalanx. No additional fracture. No dislocation. Slight flexion deformity at the fifth D IP joint. Joint spaces are preserved. Bone mineralization is normal. Soft tissues are unremarkable. IMPRESSION: 1. Acute avulsion fracture at the dorsal base of the fifth distal phalanx. Electronically Signed   By: Obie Dredge M.D.   On: 02/13/2023 10:09    Recent Labs: Lab Results  Component Value Date   WBC 6.1 09/02/2022   HGB 12.5 09/02/2022   PLT 188 09/02/2022   NA 140 09/02/2022   K 4.4 09/02/2022   CL 100 09/02/2022   CO2 30 09/02/2022   GLUCOSE 97 09/02/2022   BUN 14 09/02/2022   CREATININE 0.82 09/02/2022   BILITOT 0.4 09/02/2022   ALKPHOS 83 01/05/2016   AST 14 09/02/2022   ALT 13 09/02/2022   PROT 6.3 09/02/2022   ALBUMIN 4.3 01/05/2016   CALCIUM 9.1 09/02/2022   GFRAA 90 10/08/2020    Speciality Comments: No specialty comments available.  Procedures:  No procedures performed Allergies: Hydroxychloroquine, Prednisone, Doxycycline, Ibuprofen, Keflex [cephalexin], Ketorolac, Lodine [etodolac], Methocarbamol, Oxycodone, Oxycodone, and Robaxin  [methocarbamol]   Assessment / Plan:     Visit Diagnoses: No diagnosis found.  Orders: No orders of the defined types were placed in this encounter.  No orders of the defined types were placed in this encounter.   Face-to-face time spent with patient was *** minutes. Greater than 50% of time was spent in counseling and coordination of care.  Follow-Up Instructions: No follow-ups on file.   Ellen Henri, CMA  Note - This record has been created using Animal nutritionist.  Chart creation errors have been sought, but may not always  have been located. Such creation errors do not reflect on  the standard of medical care.

## 2023-03-05 ENCOUNTER — Ambulatory Visit: Payer: Commercial Managed Care - PPO | Admitting: Rheumatology

## 2023-03-05 DIAGNOSIS — I7300 Raynaud's syndrome without gangrene: Secondary | ICD-10-CM

## 2023-03-05 DIAGNOSIS — M19041 Primary osteoarthritis, right hand: Secondary | ICD-10-CM

## 2023-03-05 DIAGNOSIS — F419 Anxiety disorder, unspecified: Secondary | ICD-10-CM

## 2023-03-05 DIAGNOSIS — Z8709 Personal history of other diseases of the respiratory system: Secondary | ICD-10-CM

## 2023-03-05 DIAGNOSIS — Z8739 Personal history of other diseases of the musculoskeletal system and connective tissue: Secondary | ICD-10-CM

## 2023-03-05 DIAGNOSIS — M79672 Pain in left foot: Secondary | ICD-10-CM

## 2023-03-05 DIAGNOSIS — Z8669 Personal history of other diseases of the nervous system and sense organs: Secondary | ICD-10-CM

## 2023-03-05 DIAGNOSIS — Z87891 Personal history of nicotine dependence: Secondary | ICD-10-CM

## 2023-03-05 DIAGNOSIS — M349 Systemic sclerosis, unspecified: Secondary | ICD-10-CM

## 2023-03-05 DIAGNOSIS — C50412 Malignant neoplasm of upper-outer quadrant of left female breast: Secondary | ICD-10-CM

## 2023-03-05 DIAGNOSIS — M1120 Other chondrocalcinosis, unspecified site: Secondary | ICD-10-CM

## 2023-03-05 DIAGNOSIS — Z8781 Personal history of (healed) traumatic fracture: Secondary | ICD-10-CM

## 2023-04-21 ENCOUNTER — Ambulatory Visit: Payer: Commercial Managed Care - PPO | Admitting: Orthopedic Surgery

## 2023-04-21 ENCOUNTER — Other Ambulatory Visit (INDEPENDENT_AMBULATORY_CARE_PROVIDER_SITE_OTHER): Payer: Commercial Managed Care - PPO

## 2023-04-21 DIAGNOSIS — M20011 Mallet finger of right finger(s): Secondary | ICD-10-CM | POA: Diagnosis not present

## 2023-04-21 NOTE — Progress Notes (Signed)
Doris Bender - 62 y.o. female MRN [id number]  Date of birth: 10/30/1960  Office Visit Note: Visit Date: 04/21/2023 PCP: Richardean Chimera, MD Referred by: Richardean Chimera, MD  Subjective: No chief complaint on file.  HPI: Doris Bender is a pleasant 62 y.o. female who presents today for evaluation of a right small finger mallet deformity after injury sustained over 2 months prior.  She was initially seen in a local emergency department, was given a splint, however the splint was not very well-fitting, and she discontinued its use after 2 to 3 weeks.  Since that time, she has noted a ongoing deformity of the right small finger with the DIP in flexion, unable to fully extend.  She has been able to utilize the hand without significant restriction, is having some ongoing pain and tenderness over the DIP dorsally.  Pertinent ROS were reviewed with the patient and found to be negative unless otherwise specified above in HPI.   Visit Reason: right small finger mallet deformity Duration of symptoms: 2+ months Hand dominance: right  Occupation: retired Diabetic: No Smoking: No Heart/Lung History: COPD Blood Thinners: none   Assessment & Plan: Visit Diagnoses:  1. Mallet deformity of right little finger     Plan: Extensive discussion was had the patient today regarding her right small finger mallet deformity.  Repeat x-rays were taken today which showed chronic bony mallet injury, DIP remains in a flexed position with evidence of nonhealed bony mallet fracture.  At this juncture, given the chronicity of her issue, we did discuss the poor healing potential of the bone at this time with simple reduction and pinning.  We also discussed that there is underlying arthritic change at the DIP joint, meaning that potential DIP fusion in the future would help correct pain and the ongoing deformity.  Risks and benefits of the procedure were discussed, risks including but not limited to  infection, bleeding, scarring, stiffness, nerve injury, tendon injury, vascular injury, hardware complication, recurrence of symptoms and need for subsequent operation.  Patient expressed understanding.    There is a minimal swan-neck deformity which would likely correct with treatment of the DIP pathology alone.  For the time being, she will wear a mallet splint over the DIP joint to better predict her function potential fusion in the future.  At this juncture, she does not want any aggressive measures.  She will return to me in the future as needed to discuss treatment of the chronic bony mallet injury and potential DIP fusion.  Greater than 45 minutes was spent reviewing previous imaging, documentation as well as in examination and discussion with patient regarding treatment.  Follow-up: No follow-ups on file.   Meds & Orders: No orders of the defined types were placed in this encounter.   Orders Placed This Encounter  Procedures   XR Finger Little Right     Procedures: No procedures performed      Clinical History: No specialty comments available.  She reports that she quit smoking about 13 months ago. Her smoking use included cigarettes. She started smoking about 51 years ago. She has a 37.5 pack-year smoking history. She has never been exposed to tobacco smoke. She has never used smokeless tobacco. No results for input(s): "HGBA1C", "LABURIC" in the last 8760 hours.  Objective:   Vital Signs: There were no vitals taken for this visit.  Physical Exam  Gen: Well-appearing, in no acute distress; non-toxic CV: Regular Rate. Well-perfused. Warm.  Resp: Breathing  unlabored on room air; no wheezing. Psych: Fluid speech in conversation; appropriate affect; normal thought process  Ortho Exam Right small finger: - Tenderness over the DIP, flexion deformity notable of the DIP, passively correctable to near neutral - Nodularity over the DIP joint - Digit is warm well-perfused, capillary  refill is appropriate - Mild hyper extension of the PIP, minimal swan neck deformity  Imaging: X-rays of right small finger in chart  Past Medical/Family/Surgical/Social History: Medications & Allergies reviewed per EMR, new medications updated. Patient Active Problem List   Diagnosis Date Noted   COPD (chronic obstructive pulmonary disease) (HCC) 09/11/2019   Smoker 09/24/2017   Primary osteoarthritis of both hands 09/24/2017   Autoimmune disease (HCC) 09/24/2017   History of osteoporosis 09/13/2017   History of vertebral compression fracture 09/13/2017   Raynaud's syndrome without gangrene 09/13/2017   Pseudogout 09/13/2017   History of migraine 09/13/2017   Left lateral epicondylitis 07/24/2015   Back pain 07/24/2013   Compression fracture of L4 vertebra (HCC) 07/24/2013   ANA positive 05/31/2013   Osteoporosis 05/31/2013   Raynaud phenomenon 05/31/2013   Breast cancer (HCC) 05/30/2013   Cancer (HCC) 05/04/2013   Breast cancer of upper-outer quadrant of left female breast (HCC) 10/18/2012   Past Medical History:  Diagnosis Date   Arthritis    lt great toe (RA)   Breast cancer (HCC) 11/15/2012   left   Cancer (HCC)    breast   Compression fracture of L4 lumbar vertebra 11/04/2012   injury   Contact lens/glasses fitting    wears contacts or glasses   Gout 8-9 yrs ago   Rt heel/foot   History of radiation therapy 02/20/13-04/07/13   left breast 50.4 gray, upper outer quadrant boosted to 62.4 gray   Osteoporosis    Pseudogout of joint of left foot 6 yrs ago   Raynaud's disease    bilateral hands and feet   Scleroderma (HCC)    Wears partial dentures    partial lower   Family History  Problem Relation Age of Onset   Hyperthyroidism Mother    Cirrhosis Father    Diabetes Brother    Heart attack Maternal Grandmother    Heart attack Maternal Grandfather    Past Surgical History:  Procedure Laterality Date   BREAST LUMPECTOMY WITH NEEDLE LOCALIZATION AND  AXILLARY SENTINEL LYMPH NODE BX Left 12/21/2012   Procedure: BREAST LUMPECTOMY WITH NEEDLE LOCALIZATION AND AXILLARY SENTINEL LYMPH NODE BX;  Surgeon: Currie Paris, MD;  Location: [facility];  Service: General;  Laterality: Left;  needle local BCG     BREAST SURGERY  age 58   left breast bx- benign   CERVICAL CONIZATION W/BX  2012   benign   ELBOW SURGERY     KNEE ARTHROPLASTY     KNEE ARTHROSCOPY  2006   right   LATERAL EPICONDYLE RELEASE  03/08/2012   Procedure: TENNIS ELBOW RELEASE;  Surgeon: Wyn Forster., MD;  Location: [facility];  Service: Orthopedics;  Laterality: Right;  RECONSTRUCTION OF RIGHT LATERAL ELBOW WITH TENDONS   MASS EXCISION  03/08/2012   Procedure: EXCISION MASS;  Surgeon: Wyn Forster., MD;  Location: [facility];  Service: Orthopedics;  Laterality: Right;  EXCISION OF RIGHT WRIST DORSAL CYST   TENNIS ELBOW RELEASE/NIRSCHEL PROCEDURE Left 09/12/2015   Procedure: ARTHROTOMY LEFT ELBOW, REPAIR EXTENSOR ORIGIN;  Surgeon: Cindee Salt, MD;  Location: [facility];  Service: Orthopedics;  Laterality: Left;  TONSILLECTOMY     TONSILLECTOMY     62yrs old   WRIST GANGLION EXCISION     left x2   Social History   Occupational History   Not on file  Tobacco Use   Smoking status: Former    Current packs/day: 0.00    Average packs/day: 0.8 packs/day for 50.0 years (37.5 ttl pk-yrs)    Types: Cigarettes    Start date: 03/20/1972    Quit date: 03/20/2022    Years since quitting: 1.0    Passive exposure: Never   Smokeless tobacco: Never  Vaping Use   Vaping status: Never Used  Substance and Sexual Activity   Alcohol use: Yes    Comment: occ   Drug use: No   Sexual activity: Not on file    Comment: menarche age 9, miscarriage x 1, P1,  age 49 w/birth of son, Depo-Provera x 20 yrs    Jhordan Mckibben Fara Boros) Denese Killings, M.D. [facility]

## 2023-06-02 ENCOUNTER — Other Ambulatory Visit (INDEPENDENT_AMBULATORY_CARE_PROVIDER_SITE_OTHER): Payer: Commercial Managed Care - PPO

## 2023-06-02 ENCOUNTER — Ambulatory Visit: Payer: Commercial Managed Care - PPO | Admitting: Orthopedic Surgery

## 2023-06-02 DIAGNOSIS — M20011 Mallet finger of right finger(s): Secondary | ICD-10-CM

## 2023-06-02 NOTE — Progress Notes (Unsigned)
Doris Bender - 63 y.o. female MRN [id number]  Date of birth: December 02, 1960  Office Visit Note: Visit Date: 06/02/2023 PCP: Richardean Chimera, MD Referred by: Richardean Chimera, MD  Subjective: No chief complaint on file.  HPI: Doris Bender is a pleasant 63 y.o. female who presents today for follow-up of a right small finger mallet deformity after subacute injury.  She has been utilizing the mallet splint as instructed.  Has noted some correction of the DIP flexion deformity, is having some skin irritation from the splinting.  Pertinent ROS were reviewed with the patient and found to be negative unless otherwise specified above in HPI.      Assessment & Plan: Visit Diagnoses:  1. Mallet deformity of right little finger     Plan: Extensive discussion was had the patient today regarding her right small finger mallet deformity.  Repeat x-rays were taken today which showed chronic bony mallet injury, DIP remains in a slightly flexed position with evidence of nonhealed bony mallet fracture.  At this juncture, given the chronicity of her issue, we did discuss the poor healing potential of the bone at this time with simple reduction and pinning.  We also discussed that there is underlying arthritic change at the DIP joint, meaning that potential DIP fusion in the future would help correct pain and the ongoing deformity.  Risks and benefits of the procedure were discussed, risks including but not limited to infection, bleeding, scarring, stiffness, nerve injury, tendon injury, vascular injury, hardware complication, recurrence of symptoms and need for subsequent operation.  Patient expressed understanding.    However, she is doing quite well status post mallet splinting, so we can continue with conservative measures for the time being.  She can discontinue the mallet splint at this point and can progress her activities as tolerated.  I explained that she can return to me in the future as  needed should she have ongoing pain or deformity for reconsideration of DIP fusion in the future.  Follow-up: No follow-ups on file.   Meds & Orders: No orders of the defined types were placed in this encounter.   Orders Placed This Encounter  Procedures   XR Finger Little Right     Procedures: No procedures performed      Clinical History: No specialty comments available.  She reports that she quit smoking about 14 months ago. Her smoking use included cigarettes. She started smoking about 51 years ago. She has a 37.5 pack-year smoking history. She has never been exposed to tobacco smoke. She has never used smokeless tobacco. No results for input(s): "HGBA1C", "LABURIC" in the last 8760 hours.  Objective:   Vital Signs: There were no vitals taken for this visit.  Physical Exam  Gen: Well-appearing, in no acute distress; non-toxic CV: Regular Rate. Well-perfused. Warm.  Resp: Breathing unlabored on room air; no wheezing. Psych: Fluid speech in conversation; appropriate affect; normal thought process  Ortho Exam Right small finger: - Minimal tenderness over the DIP, flexion deformity mild of the DIP, passively correctable to near neutral, able to perform extension at the DIP on examination today - Nodularity over the DIP joint - Digit is warm well-perfused, capillary refill is appropriate - Mild hyper extension of the PIP, minimal swan neck deformity  Imaging: X-rays of right small finger in chart  Past Medical/Family/Surgical/Social History: Medications & Allergies reviewed per EMR, new medications updated. Patient Active Problem List   Diagnosis Date Noted   COPD (chronic obstructive pulmonary  disease) (HCC) 09/11/2019   Smoker 09/24/2017   Primary osteoarthritis of both hands 09/24/2017   Autoimmune disease (HCC) 09/24/2017   History of osteoporosis 09/13/2017   History of vertebral compression fracture 09/13/2017   Raynaud's syndrome without gangrene 09/13/2017    Pseudogout 09/13/2017   History of migraine 09/13/2017   Left lateral epicondylitis 07/24/2015   Back pain 07/24/2013   Compression fracture of L4 vertebra (HCC) 07/24/2013   ANA positive 05/31/2013   Osteoporosis 05/31/2013   Raynaud phenomenon 05/31/2013   Breast cancer (HCC) 05/30/2013   Cancer (HCC) 05/04/2013   Breast cancer of upper-outer quadrant of left female breast (HCC) 10/18/2012   Past Medical History:  Diagnosis Date   Arthritis    lt great toe (RA)   Breast cancer (HCC) 11/15/2012   left   Cancer (HCC)    breast   Compression fracture of L4 lumbar vertebra 11/04/2012   injury   Contact lens/glasses fitting    wears contacts or glasses   Gout 8-9 yrs ago   Rt heel/foot   History of radiation therapy 02/20/13-04/07/13   left breast 50.4 gray, upper outer quadrant boosted to 62.4 gray   Osteoporosis    Pseudogout of joint of left foot 6 yrs ago   Raynaud's disease    bilateral hands and feet   Scleroderma (HCC)    Wears partial dentures    partial lower   Family History  Problem Relation Age of Onset   Hyperthyroidism Mother    Cirrhosis Father    Diabetes Brother    Heart attack Maternal Grandmother    Heart attack Maternal Grandfather    Past Surgical History:  Procedure Laterality Date   BREAST LUMPECTOMY WITH NEEDLE LOCALIZATION AND AXILLARY SENTINEL LYMPH NODE BX Left 12/21/2012   Procedure: BREAST LUMPECTOMY WITH NEEDLE LOCALIZATION AND AXILLARY SENTINEL LYMPH NODE BX;  Surgeon: Currie Paris, MD;  Location: [facility];  Service: General;  Laterality: Left;  needle local BCG     BREAST SURGERY  age 15   left breast bx- benign   CERVICAL CONIZATION W/BX  2012   benign   ELBOW SURGERY     KNEE ARTHROPLASTY     KNEE ARTHROSCOPY  2006   right   LATERAL EPICONDYLE RELEASE  03/08/2012   Procedure: TENNIS ELBOW RELEASE;  Surgeon: Wyn Forster., MD;  Location: [facility];  Service: Orthopedics;  Laterality:  Right;  RECONSTRUCTION OF RIGHT LATERAL ELBOW WITH TENDONS   MASS EXCISION  03/08/2012   Procedure: EXCISION MASS;  Surgeon: Wyn Forster., MD;  Location: [facility];  Service: Orthopedics;  Laterality: Right;  EXCISION OF RIGHT WRIST DORSAL CYST   TENNIS ELBOW RELEASE/NIRSCHEL PROCEDURE Left 09/12/2015   Procedure: ARTHROTOMY LEFT ELBOW, REPAIR EXTENSOR ORIGIN;  Surgeon: Cindee Salt, MD;  Location: [facility];  Service: Orthopedics;  Laterality: Left;   TONSILLECTOMY     TONSILLECTOMY     63yrs old   WRIST GANGLION EXCISION     left x2   Social History   Occupational History   Not on file  Tobacco Use   Smoking status: Former    Current packs/day: 0.00    Average packs/day: 0.8 packs/day for 50.0 years (37.5 ttl pk-yrs)    Types: Cigarettes    Start date: 03/20/1972    Quit date: 03/20/2022    Years since quitting: 1.2    Passive exposure: Never   Smokeless tobacco: Never  Vaping  Use   Vaping status: Never Used  Substance and Sexual Activity   Alcohol use: Yes    Comment: occ   Drug use: No   Sexual activity: Not on file    Comment: menarche age 52, miscarriage x 1, P1,  age 49 w/birth of son, Depo-Provera x 20 yrs    Tasheema Perrone Fara Boros) Denese Killings, M.D. [facility]

## 2023-06-15 ENCOUNTER — Emergency Department (HOSPITAL_COMMUNITY): Payer: Commercial Managed Care - PPO

## 2023-06-15 ENCOUNTER — Other Ambulatory Visit: Payer: Self-pay

## 2023-06-15 ENCOUNTER — Encounter (HOSPITAL_COMMUNITY): Payer: Self-pay

## 2023-06-15 ENCOUNTER — Observation Stay (HOSPITAL_COMMUNITY)
Admission: EM | Admit: 2023-06-15 | Discharge: 2023-06-17 | Disposition: A | Discharge status: Home / Self Care | Payer: Commercial Managed Care - PPO | Attending: Internal Medicine | Admitting: Internal Medicine

## 2023-06-15 DIAGNOSIS — K2289 Other specified disease of esophagus: Principal | ICD-10-CM | POA: Diagnosis present

## 2023-06-15 DIAGNOSIS — R079 Chest pain, unspecified: Secondary | ICD-10-CM | POA: Diagnosis present

## 2023-06-15 DIAGNOSIS — F419 Anxiety disorder, unspecified: Secondary | ICD-10-CM | POA: Diagnosis not present

## 2023-06-15 DIAGNOSIS — Z79899 Other long term (current) drug therapy: Secondary | ICD-10-CM | POA: Diagnosis not present

## 2023-06-15 DIAGNOSIS — Z853 Personal history of malignant neoplasm of breast: Secondary | ICD-10-CM | POA: Diagnosis not present

## 2023-06-15 DIAGNOSIS — Z1152 Encounter for screening for COVID-19: Secondary | ICD-10-CM | POA: Insufficient documentation

## 2023-06-15 DIAGNOSIS — Z87891 Personal history of nicotine dependence: Secondary | ICD-10-CM | POA: Insufficient documentation

## 2023-06-15 DIAGNOSIS — K222 Esophageal obstruction: Principal | ICD-10-CM

## 2023-06-15 DIAGNOSIS — J449 Chronic obstructive pulmonary disease, unspecified: Secondary | ICD-10-CM | POA: Insufficient documentation

## 2023-06-15 DIAGNOSIS — R1310 Dysphagia, unspecified: Secondary | ICD-10-CM | POA: Insufficient documentation

## 2023-06-15 LAB — CBC WITH DIFFERENTIAL/PLATELET
Abs Immature Granulocytes: 0.02 10*3/uL (ref 0.00–0.07)
Basophils Absolute: 0 10*3/uL (ref 0.0–0.1)
Basophils Relative: 1 %
Eosinophils Absolute: 0 10*3/uL (ref 0.0–0.5)
Eosinophils Relative: 1 %
HCT: 38 % (ref 36.0–46.0)
Hemoglobin: 12.3 g/dL (ref 12.0–15.0)
Immature Granulocytes: 0 %
Lymphocytes Relative: 8 %
Lymphs Abs: 0.6 10*3/uL — ABNORMAL LOW (ref 0.7–4.0)
MCH: 29 pg (ref 26.0–34.0)
MCHC: 32.4 g/dL (ref 30.0–36.0)
MCV: 89.6 fL (ref 80.0–100.0)
Monocytes Absolute: 0.5 10*3/uL (ref 0.1–1.0)
Monocytes Relative: 5 %
Neutro Abs: 7.3 10*3/uL (ref 1.7–7.7)
Neutrophils Relative %: 85 %
Platelets: 225 10*3/uL (ref 150–400)
RBC: 4.24 MIL/uL (ref 3.87–5.11)
RDW: 13.3 % (ref 11.5–15.5)
WBC: 8.5 10*3/uL (ref 4.0–10.5)
nRBC: 0 % (ref 0.0–0.2)

## 2023-06-15 LAB — COMPREHENSIVE METABOLIC PANEL
ALT: 14 U/L (ref 0–44)
AST: 16 U/L (ref 15–41)
Albumin: 4.3 g/dL (ref 3.5–5.0)
Alkaline Phosphatase: 81 U/L (ref 38–126)
Anion gap: 12 (ref 5–15)
BUN: 15 mg/dL (ref 8–23)
CO2: 26 mmol/L (ref 22–32)
Calcium: 9.4 mg/dL (ref 8.9–10.3)
Chloride: 97 mmol/L — ABNORMAL LOW (ref 98–111)
Creatinine, Ser: 0.75 mg/dL (ref 0.44–1.00)
GFR, Estimated: >60 mL/min (ref >60)
Glucose, Bld: 121 mg/dL — ABNORMAL HIGH (ref 70–99)
Potassium: 4.2 mmol/L (ref 3.5–5.1)
Sodium: 135 mmol/L (ref 135–145)
Total Bilirubin: 1 mg/dL (ref 0.0–1.2)
Total Protein: 7.3 g/dL (ref 6.5–8.1)

## 2023-06-15 LAB — RESP PANEL BY RT-PCR (RSV, FLU A&B, COVID)  RVPGX2
Influenza A by PCR: NEGATIVE
Influenza B by PCR: NEGATIVE
Resp Syncytial Virus by PCR: NEGATIVE
SARS Coronavirus 2 by RT PCR: NEGATIVE

## 2023-06-15 LAB — MAGNESIUM: Magnesium: 2.2 mg/dL (ref 1.7–2.4)

## 2023-06-15 LAB — TROPONIN I (HIGH SENSITIVITY)
Troponin I (High Sensitivity): <2 ng/L (ref <18)
Troponin I (High Sensitivity): 2 ng/L (ref <18)

## 2023-06-15 MED ORDER — MORPHINE SULFATE (PF) 2 MG/ML IV SOLN: 2.0000 mg | INTRAVENOUS | Status: DC | PRN

## 2023-06-15 MED ORDER — ACETAMINOPHEN 650 MG RE SUPP: 650.0000 mg | Freq: Four times a day (QID) | RECTAL | Status: DC | PRN

## 2023-06-15 MED ORDER — LACTATED RINGERS IV SOLN: INTRAVENOUS | Status: AC

## 2023-06-15 MED ORDER — PANTOPRAZOLE SODIUM 40 MG IV SOLR
40.0000 mg | Freq: Two times a day (BID) | INTRAVENOUS | Status: DC
  Administered 2023-06-15 – 2023-06-17 (×3): 40 mg via INTRAVENOUS
  Filled 2023-06-15 (×3): qty 10

## 2023-06-15 MED ORDER — OXYCODONE HCL 5 MG PO TABS: 5.0000 mg | ORAL_TABLET | Freq: Four times a day (QID) | ORAL | Status: DC | PRN

## 2023-06-15 MED ORDER — SODIUM CHLORIDE 0.9 % IV SOLN: 250.0000 mL | INTRAVENOUS | Status: AC | PRN

## 2023-06-15 MED ORDER — ALBUTEROL SULFATE (2.5 MG/3ML) 0.083% IN NEBU: 2.5000 mg | INHALATION_SOLUTION | Freq: Four times a day (QID) | RESPIRATORY_TRACT | Status: DC | PRN

## 2023-06-15 MED ORDER — SODIUM CHLORIDE 0.9% FLUSH
3.0000 mL | Freq: Two times a day (BID) | INTRAVENOUS | Status: DC
  Administered 2023-06-16 – 2023-06-17 (×3): 3 mL via INTRAVENOUS

## 2023-06-15 MED ORDER — ACETAMINOPHEN 325 MG PO TABS
ORAL_TABLET | ORAL | Status: AC
  Administered 2023-06-15: 650 mg via ORAL
  Filled 2023-06-15: qty 2

## 2023-06-15 MED ORDER — HYDROCODONE-ACETAMINOPHEN 5-325 MG PO TABS
1.0000 | ORAL_TABLET | Freq: Once | ORAL | Status: DC
  Filled 2023-06-15: qty 1

## 2023-06-15 MED ORDER — SODIUM CHLORIDE 0.9% FLUSH: 3.0000 mL | INTRAVENOUS | Status: DC | PRN

## 2023-06-15 MED ORDER — IOHEXOL 350 MG/ML SOLN
75.0000 mL | Freq: Once | INTRAVENOUS | Status: AC | PRN
  Administered 2023-06-15: 75 mL via INTRAVENOUS

## 2023-06-15 MED ORDER — ONDANSETRON HCL 4 MG/2ML IJ SOLN: 4.0000 mg | Freq: Four times a day (QID) | INTRAMUSCULAR | Status: DC | PRN

## 2023-06-15 MED ORDER — ACETAMINOPHEN 325 MG PO TABS
650.0000 mg | ORAL_TABLET | Freq: Four times a day (QID) | ORAL | Status: DC | PRN
  Administered 2023-06-16: 650 mg via ORAL
  Filled 2023-06-15: qty 2

## 2023-06-15 MED ORDER — ONDANSETRON HCL 4 MG PO TABS: 4.0000 mg | ORAL_TABLET | Freq: Four times a day (QID) | ORAL | Status: DC | PRN

## 2023-06-15 MED ORDER — ENOXAPARIN SODIUM 40 MG/0.4ML IJ SOSY
40.0000 mg | PREFILLED_SYRINGE | INTRAMUSCULAR | Status: DC
  Administered 2023-06-15 – 2023-06-16 (×2): 40 mg via SUBCUTANEOUS
  Filled 2023-06-15 (×2): qty 0.4

## 2023-06-15 MED ORDER — ACETAMINOPHEN 325 MG PO TABS
650.0000 mg | ORAL_TABLET | Freq: Once | ORAL | Status: AC
  Filled 2023-06-15: qty 2

## 2023-06-15 MED ORDER — ONDANSETRON HCL 4 MG/2ML IJ SOLN
4.0000 mg | Freq: Four times a day (QID) | INTRAMUSCULAR | Status: DC | PRN
  Filled 2023-06-15: qty 2

## 2023-06-15 MED ORDER — ALPRAZOLAM 0.25 MG PO TABS
0.2500 mg | ORAL_TABLET | ORAL | Status: DC | PRN
  Administered 2023-06-15: 0.25 mg via ORAL
  Filled 2023-06-15: qty 1

## 2023-06-15 NOTE — ED Triage Notes (Signed)
Pt arrived via POV c/o chest pain that radiates to her left arm and back. Pt does endorse nausea, dizziness.

## 2023-06-15 NOTE — ED Provider Notes (Signed)
[place] EMERGENCY DEPARTMENT AT [facility] Provider Note   CSN: [id number] Arrival date & time: 06/15/23  1332     History  Chief Complaint  Patient presents with   Chest Pain    Timaya R Linney is a 63 y.o. female.   Chest Pain Associated symptoms: back pain, nausea and shortness of breath   Patient presents for chest pain.  Medical history includes breast cancer, osteoporosis, migraine headaches, COPD.  Over the past week, patient has had central chest pain radiating to left thoracic back.  Pain is worsened with inspiration.  She has had some associated nausea and lightheadedness.  She has not taken anything for pain today.  Currently, she denies nausea.  She does have ongoing severe pain, which has been worsening since onset.  She denies known history of VTE.     Home Medications Prior to Admission medications   Medication Sig Start Date End Date Taking? Authorizing Provider  Acetaminophen (TYLENOL ARTHRITIS PAIN PO) Take by mouth as needed.    [provider]  albuterol (VENTOLIN HFA) 108 (90 Base) MCG/ACT inhaler as needed. 10/08/21   [provider]  ALPRAZolam Prudy Feeler) 0.25 MG tablet Take 0.25 mg by mouth as needed for anxiety.    [provider]  amoxicillin (AMOXIL) 500 MG tablet Take 500 mg by mouth 3 (three) times daily. 08/31/22   [provider]  HYDROcodone-acetaminophen (NORCO/VICODIN) 5-325 MG tablet Take 1 tablet by mouth every 6 (six) hours as needed for moderate pain. 12/13/18   Hilts, Casimiro Needle, MD      Allergies    Hydroxychloroquine, Levofloxacin, Prednisone, Doxycycline, Ibuprofen, Keflex [cephalexin], Ketorolac, Lodine [etodolac], Methocarbamol, Oxycodone, Oxycodone, and Robaxin [methocarbamol]    Review of Systems   Review of Systems  Respiratory:  Positive for shortness of breath.   Cardiovascular:  Positive for chest pain.  Gastrointestinal:  Positive for nausea.  Musculoskeletal:  Positive for back  pain.  Neurological:  Positive for light-headedness.  All other systems reviewed and are negative.   Physical Exam Updated Vital Signs BP 130/80   Pulse 89   Temp 100 F (37.8 C) (Oral)   Resp 15   Ht 5\' 5"  (1.651 m)   Wt 60.3 kg   SpO2 97%   BMI 22.12 kg/m  Physical Exam Vitals and nursing note reviewed.  Constitutional:      General: She is not in acute distress.    Appearance: She is well-developed. She is not toxic-appearing or diaphoretic.  HENT:     Head: Normocephalic and atraumatic.  Eyes:     Conjunctiva/sclera: Conjunctivae normal.  Cardiovascular:     Rate and Rhythm: Regular rhythm. Tachycardia present.     Heart sounds: No murmur heard. Pulmonary:     Effort: Pulmonary effort is normal. Tachypnea present. No respiratory distress.     Breath sounds: Normal breath sounds.  Chest:     Chest wall: No tenderness.  Abdominal:     Palpations: Abdomen is soft.     Tenderness: There is no abdominal tenderness.  Musculoskeletal:        General: No swelling. Normal range of motion.     Cervical back: Normal range of motion and neck supple.     Right lower leg: No edema.     Left lower leg: No edema.  Skin:    General: Skin is warm and dry.     Coloration: Skin is not cyanotic or pale.  Neurological:     General: No focal  deficit present.     Mental Status: She is alert and oriented to person, place, and time.  Psychiatric:        Mood and Affect: Mood normal.        Behavior: Behavior normal.     ED Results / Procedures / Treatments   Labs (all labs ordered are listed, but only abnormal results are displayed) Labs Reviewed  CBC WITH DIFFERENTIAL/PLATELET - Abnormal; Notable for the following components:      Result Value   Lymphs Abs 0.6 (*)    All other components within normal limits  COMPREHENSIVE METABOLIC PANEL - Abnormal; Notable for the following components:   Chloride 97 (*)    Glucose, Bld 121 (*)    All other components within normal limits   RESP PANEL BY RT-PCR (RSV, FLU A&B, COVID)  [id number]  MAGNESIUM  TROPONIN I (HIGH SENSITIVITY)  TROPONIN I (HIGH SENSITIVITY)    EKG EKG Interpretation Date/Time:  Tuesday June 15 2023 15:05:22 EST Ventricular Rate:  97 PR Interval:  127 QRS Duration:  99 QT Interval:  355 QTC Calculation: 451 R Axis:   97  Text Interpretation: Sinus rhythm Right axis deviation Low voltage, precordial leads Confirmed by Gloris Manchester ([id number]) on 06/15/2023 5:29:59 PM  Radiology CT Angio Chest PE W and/or Wo Contrast Result Date: 06/15/2023 CLINICAL DATA:  Pulmonary embolism (PE) suspected, high prob Pt arrived via POV c/o chest pain that radiates to her left arm and back. Pt does endorse nausea, dizziness EXAM: CT ANGIOGRAPHY CHEST WITH CONTRAST TECHNIQUE: Multidetector CT imaging of the chest was performed using the standard protocol during bolus administration of intravenous contrast. Multiplanar CT image reconstructions and MIPs were obtained to evaluate the vascular anatomy. RADIATION DOSE REDUCTION: This exam was performed according to the departmental dose-optimization program which includes automated exposure control, adjustment of the mA and/or kV according to patient size and/or use of iterative reconstruction technique. CONTRAST:  75mL OMNIPAQUE IOHEXOL 350 MG/ML SOLN COMPARISON:  Chest XR, earlier same day.  CT chest, 09/24/2022. FINDINGS: Cardiovascular: Satisfactory opacification of the pulmonary arteries to the segmental level. No segmental or larger pulmonary embolus. Normal heart size. No pericardial effusion. Mediastinum/Nodes: Surgical clips at the lateral RIGHT breast and axilla. No enlarged mediastinal, hilar, or axillary lymph nodes. Thyroid gland and trachea demonstrate no significant findings. Esophageal distention with heterogeneously-dense debris, and transition at the esophageal hiatus. Lungs/Pleura: Increased AP thoracic diameter. Mild-to-moderate emphysematous lung change, greater within  the apices. Lungs are otherwise clear without focal consolidation, mass or suspicious pulmonary nodule. No pleural effusion or pneumothorax. Upper Abdomen: No acute abnormality. Musculoskeletal: No acute chest wall abnormality. No acute or significant osseous findings. Review of the MIP images confirms the above findings. IMPRESSION: 1. No segmental or larger pulmonary embolus. 2. Esophageal distention with ingested debris, and transition at the esophageal hiatus. Acute aspiration risk suspected. 3.  Emphysema (ICD10-J43.9). These results will be called to the ordering clinician or representative by the Radiologist Assistant, and communication documented in the PACS or [employer]. Electronically Signed   By: Roanna Banning M.D.   On: 06/15/2023 17:22   DG Chest Port 1 View Result Date: 06/15/2023 CLINICAL DATA:  Chest pain. EXAM: PORTABLE CHEST 1 VIEW COMPARISON:  Chest radiograph dated 04/29/2022. FINDINGS: Emphysema. No focal consolidation, pleural effusion, pneumothorax. The cardiac silhouette is within normal limits. No acute osseous pathology. IMPRESSION: 1. No active disease. 2. Emphysema. Electronically Signed   By: Elgie Collard M.D.   On: 06/15/2023 16:39  Procedures Procedures    Medications Ordered in ED Medications  ondansetron (ZOFRAN) injection 4 mg (has no administration in time range)  HYDROcodone-acetaminophen (NORCO/VICODIN) 5-325 MG per tablet 1 tablet (0 tablets Oral Hold 06/15/23 1551)  lactated ringers infusion (has no administration in time range)  acetaminophen (TYLENOL) tablet 650 mg (650 mg Oral Given 06/15/23 1710)  iohexol (OMNIPAQUE) 350 MG/ML injection 75 mL (75 mLs Intravenous Contrast Given 06/15/23 1611)    ED Course/ Medical Decision Making/ A&P                                 Medical Decision Making Amount and/or Complexity of Data Reviewed Labs: ordered. Radiology: ordered.  Risk OTC drugs. Prescription drug management.   This patient  presents to the ED for concern of chest pain, this involves an extensive number of treatment options, and is a complaint that carries with it a high risk of complications and morbidity.  The differential diagnosis includes ACS, PE, pericarditis, costochondritis, GERD, pneumonia   Co morbidities that complicate the patient evaluation  breast cancer, osteoporosis, migraine headaches, COPD   Additional history obtained:  Additional history obtained from N/A External records from outside source obtained and reviewed including EMR   Lab Tests:  I Ordered, and personally interpreted labs.  The pertinent results include: Kidney function, normal electrolytes, normal hemoglobin, no leukocytosis, normal troponin   Imaging Studies ordered:  I ordered imaging studies including chest x-ray, CTA chest I independently visualized and interpreted imaging which showed CTA showed dilated, debris-filled esophagus with distal mass concerning for obstruction. I agree with the radiologist interpretation   Cardiac Monitoring: / EKG:  The patient was maintained on a cardiac monitor.  I personally viewed and interpreted the cardiac monitored which showed an underlying rhythm of: Sinus rhythm   Consultations Obtained:  I requested consultation with the gastroenterologist, Dr. Levon Hedger,  and discussed lab and imaging findings as well as pertinent plan - they recommend: Clear liquids tonight, n.p.o. at midnight for planned endoscopy tomorrow   Problem List / ED Course / Critical interventions / Medication management  Patient presenting for central chest pain, pleuritic in nature, and worsening over the past week.  Vital signs on arrival notable for low-grade fever and tachycardia.  On exam, patient appears uncomfortable.  SpO2 is currently normal on room air.  She is normotensive.  Tylenol was ordered for antipyresis.  Patient declined IV pain medication.  She requested hydrocodone only.  This was ordered.   Workup was initiated.  Patient's lab work is unremarkable.  On CTA of chest, she had findings of a dilated, debris-filled esophagus with concern for obstructive distal mass.  I discussed this with gastroenterologist on-call, Dr. Levon Hedger, who will plan on endoscopic tomorrow.  Patient was admitted for further management. I ordered medication including Tylenol for antipyresis; Zofran for nausea; Norco for analgesia Reevaluation of the patient after these medicines showed that the patient improved I have reviewed the patients home medicines and have made adjustments as needed   Social Determinants of Health:  Has PCP        Final Clinical Impression(s) / ED Diagnoses Final diagnoses:  Obstruction of esophagus    Rx / DC Orders ED Discharge Orders     None         Gloris Manchester, MD 06/15/23 [phone]

## 2023-06-15 NOTE — H&P (Signed)
[facility] History and Physical   Patient: Doris Bender QMV:[id number]   PCP: Richardean Chimera, MD DOB: [birth year]   DOA: 06/15/2023   DOS: 06/15/2023   DOS: the patient was seen and examined on 06/15/2023  Patient coming from: The patient is coming from Home  Chief Complaint: Mid sternal chest discomfort  HPI: Doris Bender is a 63 y.o. female with Past medical history of COPD, breast cancer, osteoporosis, migraine, as reviewed from EMR, presented at [facility] ED with complaining of midsternal chest pain radiated to left arm and back.  Patient was having some nausea and dizziness, shortness of breath for past 1 week.  Patient is also experiencing difficulty with food ingestion, having regurgitation of phlegm, no food particles.  Patient feels pressure at the midsternal area.   ED Course: Tmax 100.6, HR 104, RR 18, BP 148/89, 97% on room air Troponin negative x 2 CMP BG 121, rest within normal range CBC within normal range Negative COVID, RSV and flu CXR: 1. No active disease. 2. Emphysema. CTA chest: 1. No segmental or larger pulmonary embolus. 2. Esophageal distention with ingested debris, and transition at the esophageal hiatus. Acute aspiration risk suspected. 3.  Emphysema   Review of Systems: as mentioned in the history of present illness.  All other systems reviewed and are negative.  Past Medical History:  Diagnosis Date   Arthritis    lt great toe (RA)   Breast cancer (HCC) 11/15/2012   left   Cancer (HCC)    breast   Compression fracture of L4 lumbar vertebra 11/04/2012   injury   Contact lens/glasses fitting    wears contacts or glasses   Gout 8-9 yrs ago   Rt heel/foot   History of radiation therapy 02/20/13-04/07/13   left breast 50.4 gray, upper outer quadrant boosted to 62.4 gray   Osteoporosis    Pseudogout of joint of left foot 6 yrs ago   Raynaud's disease    bilateral hands and feet   Scleroderma (HCC)    Wears partial dentures    partial  lower   Past Surgical History:  Procedure Laterality Date   BREAST LUMPECTOMY WITH NEEDLE LOCALIZATION AND AXILLARY SENTINEL LYMPH NODE BX Left 12/21/2012   Procedure: BREAST LUMPECTOMY WITH NEEDLE LOCALIZATION AND AXILLARY SENTINEL LYMPH NODE BX;  Surgeon: Currie Paris, MD;  Location: [facility];  Service: General;  Laterality: Left;  needle local BCG     BREAST SURGERY  age 53   left breast bx- benign   CERVICAL CONIZATION W/BX  2012   benign   ELBOW SURGERY     KNEE ARTHROPLASTY     KNEE ARTHROSCOPY  2006   right   LATERAL EPICONDYLE RELEASE  03/08/2012   Procedure: TENNIS ELBOW RELEASE;  Surgeon: Wyn Forster., MD;  Location: [facility];  Service: Orthopedics;  Laterality: Right;  RECONSTRUCTION OF RIGHT LATERAL ELBOW WITH TENDONS   MASS EXCISION  03/08/2012   Procedure: EXCISION MASS;  Surgeon: Wyn Forster., MD;  Location: [facility];  Service: Orthopedics;  Laterality: Right;  EXCISION OF RIGHT WRIST DORSAL CYST   TENNIS ELBOW RELEASE/NIRSCHEL PROCEDURE Left 09/12/2015   Procedure: ARTHROTOMY LEFT ELBOW, REPAIR EXTENSOR ORIGIN;  Surgeon: Cindee Salt, MD;  Location: [facility];  Service: Orthopedics;  Laterality: Left;   TONSILLECTOMY     TONSILLECTOMY     63yrs old   WRIST GANGLION EXCISION     left x2  Social History:  reports that she quit smoking about 14 months ago. Her smoking use included cigarettes. She started smoking about 51 years ago. She has a 37.5 pack-year smoking history. She has never been exposed to tobacco smoke. She has never used smokeless tobacco. She reports current alcohol use. She reports that she does not use drugs.  Allergies  Allergen Reactions   Hydroxychloroquine Nausea And Vomiting and Other (See Comments)    Back, stomach/ abd pain.   Levofloxacin Other (See Comments)    Hallucinations, Dizziness.    Prednisone Nausea And Vomiting    Other reaction(s): Abdominal Pain    Doxycycline     dizzy   Ibuprofen Hives   Keflex [Cephalexin] Hives   Ketorolac Hives   Lodine [Etodolac] Hives   Methocarbamol Nausea Only   Oxycodone Nausea And Vomiting   Oxycodone Nausea Only   Robaxin [Methocarbamol] Hives     Family history reviewed and not pertinent Family History  Problem Relation Age of Onset   Hyperthyroidism Mother    Cirrhosis Father    Diabetes Brother    Heart attack Maternal Grandmother    Heart attack Maternal Grandfather      Prior to Admission medications   Medication Sig Start Date End Date Taking? Authorizing Provider  Acetaminophen (TYLENOL ARTHRITIS PAIN PO) Take by mouth as needed.    [provider]  albuterol (VENTOLIN HFA) 108 (90 Base) MCG/ACT inhaler as needed. 10/08/21   [provider]  ALPRAZolam Prudy Feeler) 0.25 MG tablet Take 0.25 mg by mouth as needed for anxiety.    [provider]  amoxicillin (AMOXIL) 500 MG tablet Take 500 mg by mouth 3 (three) times daily. 08/31/22   [provider]  HYDROcodone-acetaminophen (NORCO/VICODIN) 5-325 MG tablet Take 1 tablet by mouth every 6 (six) hours as needed for moderate pain. 12/13/18   Hilts, Casimiro Needle, MD    Physical Exam: Vitals:   06/15/23 1545 06/15/23 1600 06/15/23 1645 06/15/23 1659  BP: 124/74 125/66 130/80   Pulse:  91 89   Resp: 16 18 15    Temp:    100 F (37.8 C)  TempSrc:    Oral  SpO2:  96% 97%   Weight:      Height:        General: alert and oriented to time, place, and person. Appear in mild distress, affect appropriate Eyes: PERRLA, Conjunctiva normal ENT: Oral Mucosa Clear, moist  Neck: no JVD, no Abnormal Mass Or lumps Cardiovascular: S1 and S2 Present, no Murmur, peripheral pulses symmetrical Respiratory: good respiratory effort, Bilateral Air entry equal and Decreased, no signs of accessory muscle use, Clear to Auscultation, no Crackles, no wheezes Abdomen: Bowel Sound present, Soft and no tenderness, no hernia Skin: no rashes   Extremities: no Pedal edema, no calf tenderness Neurologic: without any new focal findings Gait not checked due to patient safety concerns  Data Reviewed: I have personally reviewed and interpreted labs, imaging as discussed below.  CBC: Recent Labs  Lab 06/15/23 1455  WBC 8.5  NEUTROABS 7.3  HGB 12.3  HCT 38.0  MCV 89.6  PLT 225   Basic Metabolic Panel: Recent Labs  Lab 06/15/23 1455  NA 135  K 4.2  CL 97*  CO2 26  GLUCOSE 121*  BUN 15  CREATININE 0.75  CALCIUM 9.4  MG 2.2   GFR: Estimated Creatinine Clearance: 63.9 mL/min (by C-G formula based on SCr of 0.75 mg/dL). Liver Function Tests: Recent Labs  Lab 06/15/23 1455  AST 16  ALT 14  ALKPHOS 81  BILITOT 1.0  PROT 7.3  ALBUMIN 4.3   No results for input(s): "LIPASE", "AMYLASE" in the last 168 hours. No results for input(s): "AMMONIA" in the last 168 hours. Coagulation Profile: No results for input(s): "INR", "PROTIME" in the last 168 hours. Cardiac Enzymes: No results for input(s): "CKTOTAL", "CKMB", "CKMBINDEX", "TROPONINI" in the last 168 hours. BNP (last 3 results) No results for input(s): "PROBNP" in the last 8760 hours. HbA1C: No results for input(s): "HGBA1C" in the last 72 hours. CBG: No results for input(s): "GLUCAP" in the last 168 hours. Lipid Profile: No results for input(s): "CHOL", "HDL", "LDLCALC", "TRIG", "CHOLHDL", "LDLDIRECT" in the last 72 hours. Thyroid Function Tests: No results for input(s): "TSH", "T4TOTAL", "FREET4", "T3FREE", "THYROIDAB" in the last 72 hours. Anemia Panel: No results for input(s): "VITAMINB12", "FOLATE", "FERRITIN", "TIBC", "IRON", "RETICCTPCT" in the last 72 hours. Urine analysis:    Component Value Date/Time   COLORURINE YELLOW 09/02/2022 1211   APPEARANCEUR CLEAR 09/02/2022 1211   LABSPEC 1.010 09/02/2022 1211   PHURINE 7.5 09/02/2022 1211   GLUCOSEU NEGATIVE 09/02/2022 1211   HGBUR NEGATIVE 09/02/2022 1211   BILIRUBINUR NEGATIVE 01/05/2016 1040    KETONESUR NEGATIVE 09/02/2022 1211   PROTEINUR NEGATIVE 09/02/2022 1211   UROBILINOGEN 0.2 01/28/2014 1953   NITRITE NEGATIVE 09/02/2022 1211   LEUKOCYTESUR NEGATIVE 09/02/2022 1211    Radiological Exams on Admission: CT Angio Chest PE W and/or Wo Contrast Result Date: 06/15/2023 CLINICAL DATA:  Pulmonary embolism (PE) suspected, high prob Pt arrived via POV c/o chest pain that radiates to her left arm and back. Pt does endorse nausea, dizziness EXAM: CT ANGIOGRAPHY CHEST WITH CONTRAST TECHNIQUE: Multidetector CT imaging of the chest was performed using the standard protocol during bolus administration of intravenous contrast. Multiplanar CT image reconstructions and MIPs were obtained to evaluate the vascular anatomy. RADIATION DOSE REDUCTION: This exam was performed according to the departmental dose-optimization program which includes automated exposure control, adjustment of the mA and/or kV according to patient size and/or use of iterative reconstruction technique. CONTRAST:  75mL OMNIPAQUE IOHEXOL 350 MG/ML SOLN COMPARISON:  Chest XR, earlier same day.  CT chest, 09/24/2022. FINDINGS: Cardiovascular: Satisfactory opacification of the pulmonary arteries to the segmental level. No segmental or larger pulmonary embolus. Normal heart size. No pericardial effusion. Mediastinum/Nodes: Surgical clips at the lateral RIGHT breast and axilla. No enlarged mediastinal, hilar, or axillary lymph nodes. Thyroid gland and trachea demonstrate no significant findings. Esophageal distention with heterogeneously-dense debris, and transition at the esophageal hiatus. Lungs/Pleura: Increased AP thoracic diameter. Mild-to-moderate emphysematous lung change, greater within the apices. Lungs are otherwise clear without focal consolidation, mass or suspicious pulmonary nodule. No pleural effusion or pneumothorax. Upper Abdomen: No acute abnormality. Musculoskeletal: No acute chest wall abnormality. No acute or significant  osseous findings. Review of the MIP images confirms the above findings. IMPRESSION: 1. No segmental or larger pulmonary embolus. 2. Esophageal distention with ingested debris, and transition at the esophageal hiatus. Acute aspiration risk suspected. 3.  Emphysema (ICD10-J43.9). These results will be called to the ordering clinician or representative by the Radiologist Assistant, and communication documented in the PACS or [employer]. Electronically Signed   By: Roanna Banning M.D.   On: 06/15/2023 17:22   DG Chest Port 1 View Result Date: 06/15/2023 CLINICAL DATA:  Chest pain. EXAM: PORTABLE CHEST 1 VIEW COMPARISON:  Chest radiograph dated 04/29/2022. FINDINGS: Emphysema. No focal consolidation, pleural effusion, pneumothorax. The cardiac silhouette is within normal limits. No acute osseous pathology.  IMPRESSION: 1. No active disease. 2. Emphysema. Electronically Signed   By: Elgie Collard M.D.   On: 06/15/2023 16:39   EKG: Independently reviewed. normal EKG, normal sinus rhythm. Echocardiogram:   I reviewed all nursing notes, pharmacy notes, vitals, pertinent old records.  Assessment/Plan Principal Problem:   Esophageal dilatation   # Esophageal dilatation Presented with midsternal chest pressure Started clear liquid diet Continue PPI 40 mg twice daily Continue prn meds for pain control GI has been consulted by ED doc, keep n.p.o. after midnight for EGD tomorrow a.m.  # COPD, no active issues Resumed albuterol as needed  # Anxiety, continued Xanax as needed home dose   Nutrition: Clear liquid diet DVT Prophylaxis: Subcutaneous Lovenox  Advance goals of care discussion: Full code   Consults: GI consulted by ED physician   Family Communication: family was present at bedside, at the time of interview.  Opportunity was given to ask question and all questions were answered satisfactorily.  Disposition: Admitted as inpatient, telemetry unit. Likely to be discharged home, in  2-3 days when stable and cleared by GI.  I have discussed plan of care as described above with RN and patient/family.  Severity of Illness: The appropriate patient status for this patient is INPATIENT. Inpatient status is judged to be reasonable and necessary in order to provide the required intensity of service to ensure the patient's safety. The patient's presenting symptoms, physical exam findings, and initial radiographic and laboratory data in the context of their chronic comorbidities is felt to place them at high risk for further clinical deterioration. Furthermore, it is not anticipated that the patient will be medically stable for discharge from the hospital within 2 midnights of admission.   * I certify that at the point of admission it is my clinical judgment that the patient will require inpatient hospital care spanning beyond 2 midnights from the point of admission due to high intensity of service, high risk for further deterioration and high frequency of surveillance required.*   Author: Gillis Santa, MD [facility] 06/15/2023 6:30 PM   To reach On-call, see care teams to locate the attending and reach out to them via [email or web address]. If 7PM-7AM, please contact night-coverage If you still have difficulty reaching the attending provider, please page the [facility] (Director on Call) for [facility] on amion for assistance.

## 2023-06-16 ENCOUNTER — Inpatient Hospital Stay (HOSPITAL_COMMUNITY): Payer: Commercial Managed Care - PPO | Admitting: Anesthesiology

## 2023-06-16 ENCOUNTER — Encounter (HOSPITAL_COMMUNITY)
Admission: EM | Disposition: A | Discharge status: Home / Self Care | Payer: Self-pay | Source: Home / Self Care | Attending: Emergency Medicine

## 2023-06-16 DIAGNOSIS — R1310 Dysphagia, unspecified: Secondary | ICD-10-CM | POA: Diagnosis not present

## 2023-06-16 DIAGNOSIS — R933 Abnormal findings on diagnostic imaging of other parts of digestive tract: Secondary | ICD-10-CM | POA: Diagnosis not present

## 2023-06-16 DIAGNOSIS — K3189 Other diseases of stomach and duodenum: Secondary | ICD-10-CM | POA: Diagnosis not present

## 2023-06-16 DIAGNOSIS — R079 Chest pain, unspecified: Secondary | ICD-10-CM | POA: Diagnosis not present

## 2023-06-16 DIAGNOSIS — T18118A Gastric contents in esophagus causing other injury, initial encounter: Secondary | ICD-10-CM

## 2023-06-16 DIAGNOSIS — C160 Malignant neoplasm of cardia: Secondary | ICD-10-CM | POA: Diagnosis not present

## 2023-06-16 DIAGNOSIS — K2289 Other specified disease of esophagus: Secondary | ICD-10-CM | POA: Diagnosis not present

## 2023-06-16 DIAGNOSIS — B3781 Candidal esophagitis: Secondary | ICD-10-CM

## 2023-06-16 HISTORY — PX: ESOPHAGEAL BRUSHING: SHX6842

## 2023-06-16 HISTORY — PX: BIOPSY: SHX5522

## 2023-06-16 HISTORY — PX: ESOPHAGOGASTRODUODENOSCOPY (EGD) WITH PROPOFOL: SHX5813

## 2023-06-16 LAB — CBC
HCT: 34.7 % — ABNORMAL LOW (ref 36.0–46.0)
Hemoglobin: 11.5 g/dL — ABNORMAL LOW (ref 12.0–15.0)
MCH: 29.3 pg (ref 26.0–34.0)
MCHC: 33.1 g/dL (ref 30.0–36.0)
MCV: 88.5 fL (ref 80.0–100.0)
Platelets: 176 10*3/uL (ref 150–400)
RBC: 3.92 MIL/uL (ref 3.87–5.11)
RDW: 13.2 % (ref 11.5–15.5)
WBC: 5.8 10*3/uL (ref 4.0–10.5)
nRBC: 0 % (ref 0.0–0.2)

## 2023-06-16 LAB — BASIC METABOLIC PANEL
Anion gap: 9 (ref 5–15)
BUN: 11 mg/dL (ref 8–23)
CO2: 25 mmol/L (ref 22–32)
Calcium: 8.8 mg/dL — ABNORMAL LOW (ref 8.9–10.3)
Chloride: 101 mmol/L (ref 98–111)
Creatinine, Ser: 0.66 mg/dL (ref 0.44–1.00)
GFR, Estimated: >60 mL/min (ref >60)
Glucose, Bld: 112 mg/dL — ABNORMAL HIGH (ref 70–99)
Potassium: 4.1 mmol/L (ref 3.5–5.1)
Sodium: 135 mmol/L (ref 135–145)

## 2023-06-16 LAB — PROTIME-INR
INR: 1.1 (ref 0.8–1.2)
Prothrombin Time: 14.6 s (ref 11.4–15.2)

## 2023-06-16 LAB — PHOSPHORUS: Phosphorus: 3.5 mg/dL (ref 2.5–4.6)

## 2023-06-16 LAB — KOH PREP

## 2023-06-16 LAB — APTT: aPTT: 32 s (ref 24–36)

## 2023-06-16 LAB — MAGNESIUM: Magnesium: 2.1 mg/dL (ref 1.7–2.4)

## 2023-06-16 SURGERY — ESOPHAGOGASTRODUODENOSCOPY (EGD) WITH PROPOFOL
Anesthesia: General

## 2023-06-16 MED ORDER — LIDOCAINE HCL (PF) 2 % IJ SOLN
INTRAMUSCULAR | Status: AC
  Filled 2023-06-16: qty 5

## 2023-06-16 MED ORDER — PHENYLEPHRINE 80 MCG/ML (10ML) SYRINGE FOR IV PUSH (FOR BLOOD PRESSURE SUPPORT)
PREFILLED_SYRINGE | INTRAVENOUS | Status: AC
  Filled 2023-06-16: qty 10

## 2023-06-16 MED ORDER — FLUCONAZOLE 100 MG PO TABS
200.0000 mg | ORAL_TABLET | Freq: Every day | ORAL | Status: DC
  Administered 2023-06-17: 200 mg via ORAL
  Filled 2023-06-16: qty 2

## 2023-06-16 MED ORDER — PROPOFOL 10 MG/ML IV BOLUS
INTRAVENOUS | Status: DC | PRN
  Administered 2023-06-16: 30 mg via INTRAVENOUS
  Administered 2023-06-16: 80 mg via INTRAVENOUS

## 2023-06-16 MED ORDER — PROPOFOL 500 MG/50ML IV EMUL
INTRAVENOUS | Status: DC | PRN
  Administered 2023-06-16: 150 ug/kg/min via INTRAVENOUS

## 2023-06-16 MED ORDER — LIDOCAINE HCL (CARDIAC) PF 100 MG/5ML IV SOSY
PREFILLED_SYRINGE | INTRAVENOUS | Status: DC | PRN
Start: 2023-06-16 — End: 2023-06-16
  Administered 2023-06-16: 80 mg via INTRATRACHEAL

## 2023-06-16 MED ORDER — LACTATED RINGERS IV SOLN: INTRAVENOUS | Status: DC

## 2023-06-16 MED ORDER — LACTATED RINGERS IV SOLN: INTRAVENOUS | Status: DC | PRN

## 2023-06-16 MED ORDER — ONDANSETRON HCL 4 MG/2ML IJ SOLN
INTRAMUSCULAR | Status: AC
  Filled 2023-06-16: qty 2

## 2023-06-16 MED ORDER — FLUCONAZOLE IN SODIUM CHLORIDE 200-0.9 MG/100ML-% IV SOLN
200.0000 mg | Freq: Once | INTRAVENOUS | Status: AC
  Administered 2023-06-16: 200 mg via INTRAVENOUS
  Filled 2023-06-16: qty 100

## 2023-06-16 MED ORDER — HYDROCODONE-ACETAMINOPHEN 5-325 MG PO TABS
0.5000 | ORAL_TABLET | ORAL | Status: DC | PRN
  Administered 2023-06-17: 1 via ORAL
  Administered 2023-06-17: 0.5 via ORAL
  Filled 2023-06-16 (×2): qty 1

## 2023-06-16 MED ORDER — PROPOFOL 500 MG/50ML IV EMUL
INTRAVENOUS | Status: AC
  Filled 2023-06-16: qty 50

## 2023-06-16 MED ORDER — SODIUM CHLORIDE 0.9 % IV SOLN: INTRAVENOUS | Status: DC

## 2023-06-16 MED ORDER — ONDANSETRON HCL 4 MG/2ML IJ SOLN
4.0000 mg | Freq: Once | INTRAMUSCULAR | Status: AC
  Administered 2023-06-16: 4 mg via INTRAVENOUS

## 2023-06-16 MED ORDER — PHENYLEPHRINE 80 MCG/ML (10ML) SYRINGE FOR IV PUSH (FOR BLOOD PRESSURE SUPPORT)
PREFILLED_SYRINGE | INTRAVENOUS | Status: DC | PRN
  Administered 2023-06-16: 160 ug via INTRAVENOUS

## 2023-06-16 NOTE — Progress Notes (Signed)
   06/16/23 1122  TOC Brief Assessment  Insurance and Status Reviewed  Patient has primary care physician Yes  Home environment has been reviewed Home - alone  Prior level of function: Independent  Prior/Current Home Services No current home services  Social Drivers of Health Review SDOH reviewed no interventions necessary  Readmission risk has been reviewed Yes  Transition of care needs no transition of care needs at this time   Transition of Care Department [facility]) has reviewed patient and no TOC needs have been identified at this time. We will continue to monitor patient advancement through interdisciplinary progression rounds. If new patient transition needs arise, please place a TOC consult.

## 2023-06-16 NOTE — Anesthesia Preprocedure Evaluation (Signed)
Anesthesia Evaluation  Patient identified by MRN, date of birth, ID band Patient awake    Reviewed: Allergy & Precautions, H&P , NPO status , Patient's Chart, lab work & pertinent test results  Airway Mallampati: II  TM Distance: >3 FB Neck ROM: Full    Dental no notable dental hx. (+) Edentulous Upper, Edentulous Lower   Pulmonary COPD, former smoker   Pulmonary exam normal breath sounds clear to auscultation       Cardiovascular negative cardio ROS Normal cardiovascular exam Rhythm:Regular Rate:Normal     Neuro/Psych  PSYCHIATRIC DISORDERS Anxiety     negative neurological ROS     GI/Hepatic negative GI ROS, Neg liver ROS,,,  Endo/Other  negative endocrine ROS    Renal/GU negative Renal ROS  Female GU complaint     Musculoskeletal  (+) Arthritis ,    Abdominal Normal abdominal exam  (+)   Peds negative pediatric ROS (+)  Hematology negative hematology ROS (+)   Anesthesia Other Findings   Reproductive/Obstetrics negative OB ROS                             Anesthesia Physical Anesthesia Plan  ASA: 2  Anesthesia Plan: General   Post-op Pain Management: Minimal or no pain anticipated   Induction: Intravenous  PONV Risk Score and Plan: 1 and Propofol infusion  Airway Management Planned: Simple Face Mask and Nasal Cannula  Additional Equipment:   Intra-op Plan:   Post-operative Plan:   Informed Consent: I have reviewed the patients History and Physical, chart, labs and discussed the procedure including the risks, benefits and alternatives for the proposed anesthesia with the patient or authorized representative who has indicated his/her understanding and acceptance.       Plan Discussed with: CRNA  Anesthesia Plan Comments:        Anesthesia Quick Evaluation

## 2023-06-16 NOTE — Anesthesia Postprocedure Evaluation (Signed)
Anesthesia Post Note  Patient: [name]  Procedure(s) Performed: ESOPHAGOGASTRODUODENOSCOPY (EGD) WITH PROPOFOL BIOPSY ESOPHAGEAL BRUSHING  Patient location during evaluation: PACU Anesthesia Type: General Level of consciousness: awake and alert Pain management: pain level controlled Vital Signs Assessment: post-procedure vital signs reviewed and stable Respiratory status: spontaneous breathing, nonlabored ventilation and respiratory function stable Cardiovascular status: blood pressure returned to baseline and stable Postop Assessment: no apparent nausea or vomiting Anesthetic complications: no   No notable events documented.   Last Vitals:  Vitals:   06/16/23 1415 06/16/23 1430  BP: 105/68 (!) 123/108  Pulse: 77 79  Resp: (!) 22 (!) 29  Temp: 36.7 C 36.7 C  SpO2: 97% 97%    Last Pain:  Vitals:   06/16/23 1509  TempSrc:   PainSc: 3                  Roslynn Amble

## 2023-06-16 NOTE — ED Notes (Addendum)
ED TO INPATIENT HANDOFF REPORT  ED Nurse Name and Phone #: Beverley Fiedler Name/Age/Gender Doris Bender 62 y.o. female Room/Bed: APA14/APA14  Code Status   Code Status: Full Code  Home/SNF/Other Home Patient oriented to: self, place, time, and situation Is this baseline? Yes   Triage Complete: Triage complete  Chief Complaint Esophageal dilatation [K22.89]  Triage Note Pt arrived via POV c/o chest pain that radiates to her left arm and back. Pt does endorse nausea, dizziness.    Allergies Allergies  Allergen Reactions   Hydroxychloroquine Nausea And Vomiting and Other (See Comments)    Back, stomach/ abd pain.   Levofloxacin Other (See Comments)    Hallucinations, Dizziness.    Prednisone Nausea And Vomiting    Other reaction(s): Abdominal Pain   Doxycycline     dizzy   Ibuprofen Hives   Keflex [Cephalexin] Hives   Ketorolac Hives   Lodine [Etodolac] Hives   Methocarbamol Nausea Only   Oxycodone Nausea And Vomiting   Oxycodone Nausea Only   Robaxin [Methocarbamol] Hives    Level of Care/Admitting Diagnosis ED Disposition     ED Disposition  Admit   Condition  --   Comment  Hospital Area: [facility] [[id number]]  Level of Care: Med-Surg [16]  Covid Evaluation: Asymptomatic - no recent exposure (last 10 days) testing not required  Diagnosis: Esophageal dilatation [409811]  Admitting Physician: Gillis Santa [[id number]]  Attending Physician: Clearance Coots  Certification:: I certify this patient will need inpatient services for at least 2 midnights  Expected Medical Readiness: 06/17/2023          B Medical/Surgery History Past Medical History:  Diagnosis Date   Arthritis    lt great toe (RA)   Breast cancer (HCC) 11/15/2012   left   Cancer (HCC)    breast   Compression fracture of L4 lumbar vertebra 11/04/2012   injury   Contact lens/glasses fitting    wears contacts or glasses   Gout 8-9 yrs ago   Rt heel/foot    History of radiation therapy 02/20/13-04/07/13   left breast 50.4 gray, upper outer quadrant boosted to 62.4 gray   Osteoporosis    Pseudogout of joint of left foot 6 yrs ago   Raynaud's disease    bilateral hands and feet   Scleroderma (HCC)    Wears partial dentures    partial lower   Past Surgical History:  Procedure Laterality Date   BREAST LUMPECTOMY WITH NEEDLE LOCALIZATION AND AXILLARY SENTINEL LYMPH NODE BX Left 12/21/2012   Procedure: BREAST LUMPECTOMY WITH NEEDLE LOCALIZATION AND AXILLARY SENTINEL LYMPH NODE BX;  Surgeon: Currie Paris, MD;  Location: [facility];  Service: General;  Laterality: Left;  needle local BCG     BREAST SURGERY  age 34   left breast bx- benign   CERVICAL CONIZATION W/BX  2012   benign   ELBOW SURGERY     KNEE ARTHROPLASTY     KNEE ARTHROSCOPY  2006   right   LATERAL EPICONDYLE RELEASE  03/08/2012   Procedure: TENNIS ELBOW RELEASE;  Surgeon: Wyn Forster., MD;  Location: [facility];  Service: Orthopedics;  Laterality: Right;  RECONSTRUCTION OF RIGHT LATERAL ELBOW WITH TENDONS   MASS EXCISION  03/08/2012   Procedure: EXCISION MASS;  Surgeon: Wyn Forster., MD;  Location: [facility];  Service: Orthopedics;  Laterality: Right;  EXCISION OF RIGHT WRIST DORSAL CYST   TENNIS ELBOW RELEASE/NIRSCHEL PROCEDURE  Left 09/12/2015   Procedure: ARTHROTOMY LEFT ELBOW, REPAIR EXTENSOR ORIGIN;  Surgeon: Cindee Salt, MD;  Location: [facility];  Service: Orthopedics;  Laterality: Left;   TONSILLECTOMY     TONSILLECTOMY     63yrs old   WRIST GANGLION EXCISION     left x2     A IV Location/Drains/Wounds Patient Lines/Drains/Airways Status     Active Line/Drains/Airways     Name Placement date Placement time Site Days   Peripheral IV 06/15/23 20 G 1" Anterior;Proximal;Right Forearm 06/15/23  1537  Forearm  1            Intake/Output Last 24 hours  Intake/Output Summary (Last 24  hours) at 06/16/2023 6295 Last data filed at 06/16/2023 0631 Gross per 24 hour  Intake 1001.29 ml  Output --  Net 1001.29 ml    Labs/Imaging Results for orders placed or performed during the hospital encounter of 06/15/23 (from the past 48 hours)  Resp panel by RT-PCR (RSV, Flu A&B, Covid) Anterior Nasal Swab     Status: None   Collection Time: 06/15/23  2:27 PM   Specimen: Anterior Nasal Swab  Result Value Ref Range   SARS Coronavirus 2 by RT PCR NEGATIVE NEGATIVE    Comment: (NOTE) SARS-CoV-2 target nucleic acids are NOT DETECTED.  The SARS-CoV-2 RNA is generally detectable in upper respiratory specimens during the acute phase of infection. The lowest concentration of SARS-CoV-2 viral copies this assay can detect is 138 copies/mL. A negative result does not preclude SARS-Cov-2 infection and should not be used as the sole basis for treatment or other patient management decisions. A negative result may occur with  improper specimen collection/handling, submission of specimen other than nasopharyngeal swab, presence of viral mutation(s) within the areas targeted by this assay, and inadequate number of viral copies(<138 copies/mL). A negative result must be combined with clinical observations, patient history, and epidemiological information. The expected result is Negative.  Fact Sheet for Patients:  BloggerCourse.com  Fact Sheet for Healthcare Providers:  SeriousBroker.it  This test is no t yet approved or cleared by the [place] FDA and  has been authorized for detection and/or diagnosis of SARS-CoV-2 by FDA under an Emergency Use Authorization (EUA). This EUA will remain  in effect (meaning this test can be used) for the duration of the COVID-19 declaration under Section 564(b)(1) of the Act, 21 U.S.C.section 360bbb-3(b)(1), unless the authorization is terminated  or revoked sooner.       Influenza A by PCR NEGATIVE  NEGATIVE   Influenza B by PCR NEGATIVE NEGATIVE    Comment: (NOTE) The Xpert Xpress SARS-CoV-2/FLU/RSV plus assay is intended as an aid in the diagnosis of influenza from Nasopharyngeal swab specimens and should not be used as a sole basis for treatment. Nasal washings and aspirates are unacceptable for Xpert Xpress SARS-CoV-2/FLU/RSV testing.  Fact Sheet for Patients: BloggerCourse.com  Fact Sheet for Healthcare Providers: SeriousBroker.it  This test is not yet approved or cleared by the [place] FDA and has been authorized for detection and/or diagnosis of SARS-CoV-2 by FDA under an Emergency Use Authorization (EUA). This EUA will remain in effect (meaning this test can be used) for the duration of the COVID-19 declaration under Section 564(b)(1) of the Act, 21 U.S.C. section 360bbb-3(b)(1), unless the authorization is terminated or revoked.     Resp Syncytial Virus by PCR NEGATIVE NEGATIVE    Comment: (NOTE) Fact Sheet for Patients: BloggerCourse.com  Fact Sheet for Healthcare Providers: SeriousBroker.it  This test is  not yet approved or cleared by the [place] and has been authorized for detection and/or diagnosis of SARS-CoV-2 by FDA under an Emergency Use Authorization (EUA). This EUA will remain in effect (meaning this test can be used) for the duration of the COVID-19 declaration under Section 564(b)(1) of the Act, 21 U.S.C. section 360bbb-3(b)(1), unless the authorization is terminated or revoked.  Performed at [facility], [address], [place], [place] [address]   Troponin I (High Sensitivity)     Status: None   Collection Time: 06/15/23  2:55 PM  Result Value Ref Range   Troponin I (High Sensitivity) <2 <18 ng/L    Comment: (NOTE) Elevated high sensitivity troponin I (hsTnI) values and significant  changes across serial measurements may suggest  ACS but many other  chronic and acute conditions are known to elevate hsTnI results.  Refer to the "Links" section for chest pain algorithms and additional  guidance. Performed at [facility], [address], [place], [place] [address]   CBC with Differential     Status: Abnormal   Collection Time: 06/15/23  2:55 PM  Result Value Ref Range   WBC 8.5 4.0 - 10.5 K/uL   RBC 4.24 3.87 - 5.11 MIL/uL   Hemoglobin 12.3 12.0 - 15.0 g/dL   HCT 09.8 11.9 - 14.7 %   MCV 89.6 80.0 - 100.0 fL   MCH 29.0 26.0 - 34.0 pg   MCHC 32.4 30.0 - 36.0 g/dL   RDW 82.9 56.2 - 13.0 %   Platelets 225 150 - 400 K/uL   nRBC 0.0 0.0 - 0.2 %   Neutrophils Relative % 85 %   Neutro Abs 7.3 1.7 - 7.7 K/uL   Lymphocytes Relative 8 %   Lymphs Abs 0.6 (L) 0.7 - 4.0 K/uL   Monocytes Relative 5 %   Monocytes Absolute 0.5 0.1 - 1.0 K/uL   Eosinophils Relative 1 %   Eosinophils Absolute 0.0 0.0 - 0.5 K/uL   Basophils Relative 1 %   Basophils Absolute 0.0 0.0 - 0.1 K/uL   Immature Granulocytes 0 %   Abs Immature Granulocytes 0.02 0.00 - 0.07 K/uL    Comment: Performed at [facility], [address] [place], [place] [address]  Comprehensive metabolic panel     Status: Abnormal   Collection Time: 06/15/23  2:55 PM  Result Value Ref Range   Sodium 135 135 - 145 mmol/L   Potassium 4.2 3.5 - 5.1 mmol/L   Chloride 97 (L) 98 - 111 mmol/L   CO2 26 22 - 32 mmol/L   Glucose, Bld 121 (H) 70 - 99 mg/dL    Comment: Glucose reference range applies only to samples taken after fasting for at least 8 hours.   BUN 15 8 - 23 mg/dL   Creatinine, Ser 4.69 0.44 - 1.00 mg/dL   Calcium 9.4 8.9 - 62.9 mg/dL   Total Protein 7.3 6.5 - 8.1 g/dL   Albumin 4.3 3.5 - 5.0 g/dL   AST 16 15 - 41 U/L   ALT 14 0 - 44 U/L   Alkaline Phosphatase 81 38 - 126 U/L   Total Bilirubin 1.0 0.0 - 1.2 mg/dL   GFR, Estimated >52 >84 mL/min    Comment: (NOTE) Calculated using the CKD-EPI Creatinine Equation (2021)    Anion gap 12 5 - 15    Comment:  Performed at [facility], [address], [place], [place] [address]  Magnesium     Status: None   Collection  Time: 06/15/23  2:55 PM  Result Value Ref Range   Magnesium 2.2 1.7 - 2.4 mg/dL    Comment: Performed at [facility], [address] [place], [place] [address]  Troponin I (High Sensitivity)     Status: None   Collection Time: 06/15/23  5:01 PM  Result Value Ref Range   Troponin I (High Sensitivity) 2 <18 ng/L    Comment: (NOTE) Elevated high sensitivity troponin I (hsTnI) values and significant  changes across serial measurements may suggest ACS but many other  chronic and acute conditions are known to elevate hsTnI results.  Refer to the "Links" section for chest pain algorithms and additional  guidance. Performed at [facility], [address], [place], [place] [address]   CBC     Status: Abnormal   Collection Time: 06/16/23  4:22 AM  Result Value Ref Range   WBC 5.8 4.0 - 10.5 K/uL   RBC 3.92 3.87 - 5.11 MIL/uL   Hemoglobin 11.5 (L) 12.0 - 15.0 g/dL   HCT 85.2 (L) 77.8 - 24.2 %   MCV 88.5 80.0 - 100.0 fL   MCH 29.3 26.0 - 34.0 pg   MCHC 33.1 30.0 - 36.0 g/dL   RDW 35.3 61.4 - 43.1 %   Platelets 176 150 - 400 K/uL   nRBC 0.0 0.0 - 0.2 %    Comment: Performed at [facility], [address], [place], [place] [address]  Basic metabolic panel     Status: Abnormal   Collection Time: 06/16/23  4:22 AM  Result Value Ref Range   Sodium 135 135 - 145 mmol/L   Potassium 4.1 3.5 - 5.1 mmol/L   Chloride 101 98 - 111 mmol/L   CO2 25 22 - 32 mmol/L   Glucose, Bld 112 (H) 70 - 99 mg/dL    Comment: Glucose reference range applies only to samples taken after fasting for at least 8 hours.   BUN 11 8 - 23 mg/dL   Creatinine, Ser 6.76 0.44 - 1.00 mg/dL   Calcium 8.8 (L) 8.9 - 10.3 mg/dL   GFR, Estimated >19 >50 mL/min    Comment: (NOTE) Calculated using the CKD-EPI Creatinine Equation (2021)    Anion gap 9 5 - 15    Comment: Performed at [facility], [address],  [place], [place] [address]  Phosphorus     Status: None   Collection Time: 06/16/23  4:22 AM  Result Value Ref Range   Phosphorus 3.5 2.5 - 4.6 mg/dL    Comment: Performed at [facility], [address], [place], [place] [address]  Magnesium     Status: None   Collection Time: 06/16/23  4:22 AM  Result Value Ref Range   Magnesium 2.1 1.7 - 2.4 mg/dL    Comment: Performed at [facility], [address] [place], [place] [address]  APTT     Status: None   Collection Time: 06/16/23  4:22 AM  Result Value Ref Range   aPTT 32 24 - 36 seconds    Comment: Performed at [facility], [address] [place], [place] [address]  Protime-INR     Status: None   Collection Time: 06/16/23  4:22 AM  Result Value Ref Range   Prothrombin Time 14.6 11.4 - 15.2 seconds   INR 1.1 0.8 - 1.2    Comment: (NOTE) INR goal varies based on device and disease states. Performed at [facility], [address], [place], [place] [address]    CT Angio Chest  PE W and/or Wo Contrast Result Date: 06/15/2023 CLINICAL DATA:  Pulmonary embolism (PE) suspected, high prob Pt arrived via POV c/o chest pain that radiates to her left arm and back. Pt does endorse nausea, dizziness EXAM: CT ANGIOGRAPHY CHEST WITH CONTRAST TECHNIQUE: Multidetector CT imaging of the chest was performed using the standard protocol during bolus administration of intravenous contrast. Multiplanar CT image reconstructions and MIPs were obtained to evaluate the vascular anatomy. RADIATION DOSE REDUCTION: This exam was performed according to the departmental dose-optimization program which includes automated exposure control, adjustment of the mA and/or kV according to patient size and/or use of iterative reconstruction technique. CONTRAST:  75mL OMNIPAQUE IOHEXOL 350 MG/ML SOLN COMPARISON:  Chest XR, earlier same day.  CT chest, 09/24/2022. FINDINGS: Cardiovascular: Satisfactory opacification of the pulmonary arteries to the segmental level. No segmental or larger  pulmonary embolus. Normal heart size. No pericardial effusion. Mediastinum/Nodes: Surgical clips at the lateral RIGHT breast and axilla. No enlarged mediastinal, hilar, or axillary lymph nodes. Thyroid gland and trachea demonstrate no significant findings. Esophageal distention with heterogeneously-dense debris, and transition at the esophageal hiatus. Lungs/Pleura: Increased AP thoracic diameter. Mild-to-moderate emphysematous lung change, greater within the apices. Lungs are otherwise clear without focal consolidation, mass or suspicious pulmonary nodule. No pleural effusion or pneumothorax. Upper Abdomen: No acute abnormality. Musculoskeletal: No acute chest wall abnormality. No acute or significant osseous findings. Review of the MIP images confirms the above findings. IMPRESSION: 1. No segmental or larger pulmonary embolus. 2. Esophageal distention with ingested debris, and transition at the esophageal hiatus. Acute aspiration risk suspected. 3.  Emphysema (ICD10-J43.9). These results will be called to the ordering clinician or representative by the Radiologist Assistant, and communication documented in the PACS or [employer]. Electronically Signed   By: Roanna Banning M.D.   On: 06/15/2023 17:22   DG Chest Port 1 View Result Date: 06/15/2023 CLINICAL DATA:  Chest pain. EXAM: PORTABLE CHEST 1 VIEW COMPARISON:  Chest radiograph dated 04/29/2022. FINDINGS: Emphysema. No focal consolidation, pleural effusion, pneumothorax. The cardiac silhouette is within normal limits. No acute osseous pathology. IMPRESSION: 1. No active disease. 2. Emphysema. Electronically Signed   By: Elgie Collard M.D.   On: 06/15/2023 16:39    Pending Labs Unresulted Labs (From admission, onward)    None       Vitals/Pain Today's Vitals   06/16/23 0200 06/16/23 0300 06/16/23 0400 06/16/23 0600  BP: (!) 117/57 109/63 129/72 122/69  Pulse: 73 86 84 82  Resp:      Temp:      TempSrc:      SpO2: 96% 95% 95% 95%   Weight:      Height:      PainSc:        Isolation Precautions No active isolations  Medications Medications  HYDROcodone-acetaminophen (NORCO/VICODIN) 5-325 MG per tablet 1 tablet (0 tablets Oral Hold 06/15/23 1551)  lactated ringers infusion (0 mLs Intravenous Stopped 06/16/23 0631)  enoxaparin (LOVENOX) injection 40 mg (40 mg Subcutaneous Given 06/15/23 1941)  sodium chloride flush (NS) 0.9 % injection 3 mL (3 mLs Intravenous Not Given 06/15/23 2100)  sodium chloride flush (NS) 0.9 % injection 3 mL (has no administration in time range)  0.9 %  sodium chloride infusion (has no administration in time range)  acetaminophen (TYLENOL) tablet 650 mg (has no administration in time range)    Or  acetaminophen (TYLENOL) suppository 650 mg (has no administration in time range)  ondansetron (ZOFRAN) tablet 4 mg (has no administration in time  range)    Or  ondansetron (ZOFRAN) injection 4 mg (has no administration in time range)  pantoprazole (PROTONIX) injection 40 mg (40 mg Intravenous Given 06/15/23 1932)  oxyCODONE (Oxy IR/ROXICODONE) immediate release tablet 5 mg (has no administration in time range)  morphine (PF) 2 MG/ML injection 2 mg (has no administration in time range)  albuterol (PROVENTIL) (2.5 MG/3ML) 0.083% nebulizer solution 2.5 mg (has no administration in time range)  ALPRAZolam (XANAX) tablet 0.25 mg (0.25 mg Oral Given 06/15/23 2219)  acetaminophen (TYLENOL) tablet 650 mg (650 mg Oral Given 06/15/23 1710)  iohexol (OMNIPAQUE) 350 MG/ML injection 75 mL (75 mLs Intravenous Contrast Given 06/15/23 1611)    Mobility walks       R Recommendations: See Admitting Provider Note  Report given to: Dept 300

## 2023-06-16 NOTE — Op Note (Signed)
[facility] Patient Name: Doris Bender Procedure Date: 06/16/2023 1:15 PM MRN: [id number] Date of Birth: Apr 29, 1961 Attending MD: Gennette Pac , MD, [id number] CSN: [id number] Age: 63 Admit Type: Outpatient Procedure:                Upper GI endoscopy Indications:              Dysphagia Providers:                Gennette Pac, MD, Crystal Page, Dyann Ruddle Referring MD:              Medicines:                Propofol per Anesthesia Complications:            No immediate complications. Estimated Blood Loss:     Estimated blood loss was minimal. Procedure:                Pre-Anesthesia Assessment:                           - Prior to the procedure, a History and Physical                            was performed, and patient medications and                            allergies were reviewed. The patient's tolerance of                            previous anesthesia was also reviewed. The risks                            and benefits of the procedure and the sedation                            options and risks were discussed with the patient.                            All questions were answered, and informed consent                            was obtained. Prior Anticoagulants: The patient has                            taken no anticoagulant or antiplatelet agents. ASA                            Grade Assessment: III - A patient with severe                            systemic disease. After reviewing the risks and                            benefits, the patient was deemed in satisfactory  condition to undergo the procedure.                           After obtaining informed consent, the endoscope was                            passed under direct vision. Throughout the                            procedure, the patient's blood pressure, pulse, and                            oxygen saturations were monitored continuously. The                             GIF-H190 ([id number]) scope was introduced through the                            mouth, and advanced to the second part of duodenum.                            The upper GI endoscopy was accomplished without                            difficulty. The patient tolerated the procedure                            well. Scope In: 1:54:16 PM Scope Out: 2:06:31 PM Total Procedure Duration: 0 hours 12 minutes 15 seconds  Findings:      Raised cream-colored plaques in the proximal one third of the tubular       esophagus. Some extrinsic compression on the GE junction was able to       easily advance the scope immediately distal to the EG junction there was       a semilunar depressed ulcerated neoplastic appearing process. Perhaps 4       x 4 cm in dimensions. Please see photos. Patient had significant       retained gastric contents which precluded complete examination of the       gastric mucosa. Much was suctioned out but the entire gastric mucosa       could not be visualized. Patent pylorus.      The duodenal bulb and second portion of the duodenum were normal.       Utilizing jumbo biopsy forceps. The GE junction lesion (just on the       gastric side in the cardia) was biopsied multiple times for histologic       study. Finally, the plaques in the esophagus were brushed for KOH prep. 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 Status                            post biopsy. Retained gastric contents precluded                            complete examination of the stomach.                           -Normal duodenal bulb and second portion of the                             duodenum. Moderate Sedation:      Moderate (conscious) sedation was personally administered by an       anesthesia professional. The following parameters were monitored: oxygen       saturation, heart rate, blood pressure, respiratory rate, EKG, adequacy       of pulmonary ventilation, and response to care. Recommendation:           - Return patient to hospital ward for ongoing care.                           - Clear liquid diet.                           - Continue present medications. Follow-up on                            path/KOH. Further recommendations to follow. At                            patient request, I called Rolla Plate at                            [phone]?"UJ answer Procedure Code(s):        --- Professional ---                           [phone], Esophagogastroduodenoscopy, flexible,                            transoral; diagnostic, including collection of                            specimen(s) by brushing or washing, when performed                            (separate procedure) Diagnosis Code(s):        --- Professional ---  R13.10, Dysphagia, unspecified CPT copyright 2022 American Medical Association. All rights reserved. The codes documented in this report are preliminary and upon coder review may  be revised to meet current compliance requirements. Gerrit Friends. Nicle Connole, MD Gennette Pac, MD 06/16/2023 2:40:45 PM This report has been signed electronically. Number of Addenda: 0

## 2023-06-16 NOTE — Progress Notes (Signed)
make contact with patient's son Rolla Plate at [phone].  I reviewed findings today.  labs are pending.  Further recommendations to follow.

## 2023-06-16 NOTE — Consult Note (Signed)
Gastroenterology Consult   Referring Provider: No ref. provider found Primary Care Physician:  Richardean Chimera, MD Primary Gastroenterologist: Not established  Patient ID: Doris Bender; [id number]; 10/21/1960   Admit date: 06/15/2023  LOS: 1 day   Date of Consultation: 06/16/2023  Reason for Consultation: Chest pain/concern for esophageal mass  History of Present Illness   Doris Bender is a 63 y.o. year old female with history of arthritis, breast cancer, gout, osteoporosis, Raynaud's disease, scleroderma who presented to the ED who presented with central chest pain radiating to the back.  CTA done showed dilated, debris-filled esophagus with concern for obstructive distal mass.  GI consulted for further evaluation  ED course: Respiratory panel negative Troponins negative CBC within normal limits, CMP mostly unremarkable INR 1.1  Consult: Patient reports symptoms began last wednesday, initially feeling like some "bubbles" in her chest where she could belch and get some relief. She vomited up some phlegm over the next few days. Sunday she noted feeling that food seemed to be getting stuck in mid chest, trying to eat made her pain worse. Yesterday she noted that she had worsening pain with inspiration which prompted her to proceed to her PCP and he advised her she needed to be seen in the ED. Only had nausea prior to vomiting up phlegm. She has been avoiding eating for the most part, drinking boost for nutrition. No constipation or diarrhea. Denies rectal bleeding or melena. No weight loss or changes in appetite leading up to acute illness.   Denies history of acid reflux/indigestion in the past. She denies any new medications recently. She did take a few pain pills last week for some lower back pain, reporting she took 1/2 hydrocodone on 3 separate occasions but did not want to take more than this in order to avoid constipation. She also took a tylenol but denies any  NSAIDs.  Previous EGD: none Previous colonoscopy: maybe 10 years ago, done at [facility]    Past Medical History:  Diagnosis Date   Arthritis    lt great toe (RA)   Breast cancer (HCC) 11/15/2012   left   Cancer (HCC)    breast   Compression fracture of L4 lumbar vertebra 11/04/2012   injury   Contact lens/glasses fitting    wears contacts or glasses   Gout 8-9 yrs ago   Rt heel/foot   History of radiation therapy 02/20/13-04/07/13   left breast 50.4 gray, upper outer quadrant boosted to 62.4 gray   Osteoporosis    Pseudogout of joint of left foot 6 yrs ago   Raynaud's disease    bilateral hands and feet   Scleroderma (HCC)    Wears partial dentures    partial lower    Past Surgical History:  Procedure Laterality Date   BREAST LUMPECTOMY WITH NEEDLE LOCALIZATION AND AXILLARY SENTINEL LYMPH NODE BX Left 12/21/2012   Procedure: BREAST LUMPECTOMY WITH NEEDLE LOCALIZATION AND AXILLARY SENTINEL LYMPH NODE BX;  Surgeon: Currie Paris, MD;  Location: [facility];  Service: General;  Laterality: Left;  needle local BCG     BREAST SURGERY  age 63   left breast bx- benign   CERVICAL CONIZATION W/BX  2012   benign   ELBOW SURGERY     KNEE ARTHROPLASTY     KNEE ARTHROSCOPY  2006   right   LATERAL EPICONDYLE RELEASE  03/08/2012   Procedure: TENNIS ELBOW RELEASE;  Surgeon: Wyn Forster., MD;  Location: [facility];  Service: Orthopedics;  Laterality: Right;  RECONSTRUCTION OF RIGHT LATERAL ELBOW WITH TENDONS   MASS EXCISION  03/08/2012   Procedure: EXCISION MASS;  Surgeon: Wyn Forster., MD;  Location: [facility];  Service: Orthopedics;  Laterality: Right;  EXCISION OF RIGHT WRIST DORSAL CYST   TENNIS ELBOW RELEASE/NIRSCHEL PROCEDURE Left 09/12/2015   Procedure: ARTHROTOMY LEFT ELBOW, REPAIR EXTENSOR ORIGIN;  Surgeon: Cindee Salt, MD;  Location: [facility];  Service: Orthopedics;  Laterality: Left;   TONSILLECTOMY      TONSILLECTOMY     62yrs old   WRIST GANGLION EXCISION     left x2    Prior to Admission medications   Medication Sig Start Date End Date Taking? Authorizing Provider  acetaminophen (TYLENOL) 650 MG CR tablet Take 650 mg by mouth every 8 (eight) hours as needed for pain.   Yes [provider]  albuterol (VENTOLIN HFA) 108 (90 Base) MCG/ACT inhaler Inhale 1-2 puffs into the lungs every 6 (six) hours as needed for wheezing or shortness of breath. 10/08/21  Yes [provider]  ALPRAZolam Prudy Feeler) 0.25 MG tablet Take 0.25 mg by mouth as needed for anxiety.   Yes [provider]  HYDROcodone-acetaminophen (NORCO/VICODIN) 5-325 MG tablet Take 1 tablet by mouth every 6 (six) hours as needed for moderate pain. Patient taking differently: Take 0.5 tablets by mouth every 6 (six) hours as needed for moderate pain (pain score 4-6). 12/13/18  Yes Hilts, Casimiro Needle, MD    Current Facility-Administered Medications  Medication Dose Route Frequency Provider Last Rate Last Admin   0.9 %  sodium chloride infusion  250 mL Intravenous PRN Gillis Santa, MD       acetaminophen (TYLENOL) tablet 650 mg  650 mg Oral Q6H PRN Gillis Santa, MD       Or   acetaminophen (TYLENOL) suppository 650 mg  650 mg Rectal Q6H PRN Gillis Santa, MD       albuterol (PROVENTIL) (2.5 MG/3ML) 0.083% nebulizer solution 2.5 mg  2.5 mg Inhalation Q6H PRN Gillis Santa, MD       ALPRAZolam Prudy Feeler) tablet 0.25 mg  0.25 mg Oral PRN Gillis Santa, MD   0.25 mg at 06/15/23 2219   enoxaparin (LOVENOX) injection 40 mg  40 mg Subcutaneous Q24H Gillis Santa, MD   40 mg at 06/15/23 1941   lactated ringers infusion   Intravenous Continuous Leeroy Bock, MD   Stopped at 06/16/23 0631   morphine (PF) 2 MG/ML injection 2 mg  2 mg Intravenous Q4H PRN Gillis Santa, MD       ondansetron [facility]) tablet 4 mg  4 mg Oral Q6H PRN Gillis Santa, MD       Or   ondansetron [facility]) injection 4 mg  4 mg Intravenous Q6H PRN Gillis Santa, MD       oxyCODONE (Oxy IR/ROXICODONE) immediate release tablet 5 mg  5 mg Oral Q6H PRN Gillis Santa, MD       pantoprazole (PROTONIX) injection 40 mg  40 mg Intravenous Q12H Gillis Santa, MD   40 mg at 06/15/23 1932   sodium chloride flush (NS) 0.9 % injection 3 mL  3 mL Intravenous Q12H Gillis Santa, MD       sodium chloride flush (NS) 0.9 % injection 3 mL  3 mL Intravenous PRN Gillis Santa, MD        Allergies as of 06/15/2023 - Review Complete 06/15/2023  Allergen Reaction Noted   Hydroxychloroquine Nausea And Vomiting  and Other (See Comments) 06/15/2016   Levofloxacin Other (See Comments) 06/15/2023   Prednisone Nausea And Vomiting 06/24/2016   Doxycycline  09/13/2017   Ibuprofen Hives 03/04/2012   Keflex [cephalexin] Hives 03/04/2012   Ketorolac Hives 03/04/2012   Lodine [etodolac] Hives 03/04/2012   Methocarbamol Nausea Only 06/16/2015   Oxycodone Nausea And Vomiting 11/04/2012   Oxycodone Nausea Only 06/16/2015   Robaxin [methocarbamol] Hives 11/11/2012    Family History  Problem Relation Age of Onset   Hyperthyroidism Mother    Cirrhosis Father    Diabetes Brother    Heart attack Maternal Grandmother    Heart attack Maternal Grandfather     Social History   Socioeconomic History   Marital status: Single    Spouse name: Not on file   Number of children: Not on file   Years of education: Not on file   Highest education level: Not on file  Occupational History   Not on file  Tobacco Use   Smoking status: Former    Current packs/day: 0.00    Average packs/day: 0.8 packs/day for 50.0 years (37.5 ttl pk-yrs)    Types: Cigarettes    Start date: 03/20/1972    Quit date: 03/20/2022    Years since quitting: 1.2    Passive exposure: Never   Smokeless tobacco: Never  Vaping Use   Vaping status: Never Used  Substance and Sexual Activity   Alcohol use: Yes    Comment: occ   Drug use: No   Sexual activity: Not on file    Comment: menarche age 14,  miscarriage x 1, P1,  age 38 w/birth of son, Depo-Provera x 20 yrs  Other Topics Concern   Not on file  Social History Narrative   ** Merged History Encounter **       Social Drivers of Corporate investment banker Strain: Not on file  Food Insecurity: No Food Insecurity (06/16/2023)   Hunger Vital Sign    Worried About Running Out of Food in the Last Year: Never true    Ran Out of Food in the Last Year: Never true  Transportation Needs: No Transportation Needs (06/16/2023)   PRAPARE - Administrator, Civil Service (Medical): No    Lack of Transportation (Non-Medical): No  Physical Activity: Insufficiently Active (04/12/2023)   Received [facility]   Exercise Vital Sign    Days of Exercise per Week: 1 day    Minutes of Exercise per Session: 60 min  Stress: No Stress Concern Present (11/26/2020)   Received from [facility] - Occupational Stress Questionnaire    Feeling of Stress : Only a little  Social Connections: Unknown (09/14/2021)   Received from [facility], [facility]   Social Network    Social Network: Not on file  Intimate Partner Violence: Not At Risk (06/16/2023)   Humiliation, Afraid, Rape, and Kick questionnaire    Fear of Current or Ex-Partner: No    Emotionally Abused: No    Physically Abused: No    Sexually Abused: No     Review of Systems   Gen: Denies any fever, chills, loss of appetite, change in weight or weight loss CV: Denies chest pain, heart palpitations, syncope, edema  Resp: Denies shortness of breath with rest, cough, wheezing, coughing up blood, and pleurisy. GI:t denies melena, hematochezia, nausea, vomiting, diarrhea, constipation, early satiety or weight loss. +atypical chest pain +dysphagia +odynophagia  GU : Denies urinary burning, blood in urine, urinary frequency, and urinary incontinence. MS: Denies joint pain, limitation of movement, swelling, cramps,  and atrophy.  Derm: Denies rash, itching, dry skin, hives. Psych: Denies depression, anxiety, memory loss, hallucinations, and confusion. Heme: Denies bruising or bleeding Neuro:  Denies any headaches, dizziness, paresthesias, shaking  Physical Exam   Vital Signs in last 24 hours: Temp:  [99 F (37.2 C)-100.6 F (38.1 C)] 99.4 F (37.4 C) (02/12 0852) Pulse Rate:  [73-104] 96 (02/12 0852) Resp:  [15-94] 20 (02/12 0852) BP: (109-133)/(57-89) 122/83 (02/12 0852) SpO2:  [91 %-98 %] 98 % (02/12 0852) Weight:  [59.9 kg-60.3 kg] 59.9 kg (02/12 0852) Last BM Date : 06/15/23  General:   Alert,  Well-developed, well-nourished, pleasant and cooperative in NAD Head:  Normocephalic and atraumatic. Eyes:  Sclera clear, no icterus.   Conjunctiva pink. Ears:  Normal auditory acuity. Mouth:  No deformity or lesions, dentition normal. Neck:  Supple; no masses Lungs:  Clear throughout to auscultation.   No wheezes, crackles, or rhonchi. No acute distress. Heart:  Regular rate and rhythm; no murmurs, clicks, rubs,  or gallops. Abdomen:  Soft, nontender and nondistended. No masses, hepatosplenomegaly or hernias noted. Normal bowel sounds, without guarding, and without rebound.   Msk:  Symmetrical without gross deformities. Normal posture. Extremities:  Without clubbing or edema. Neurologic:  Alert and  oriented x4. Skin:  Intact without significant lesions or rashes. Psych:  Alert and cooperative. Normal mood and affect.   Labs/Studies   Recent Labs Recent Labs    06/15/23 1455 06/16/23 0422  WBC 8.5 5.8  HGB 12.3 11.5*  HCT 38.0 34.7*  PLT 225 176   BMET Recent Labs    06/15/23 1455 06/16/23 0422  NA 135 135  K 4.2 4.1  CL 97* 101  CO2 26 25  GLUCOSE 121* 112*  BUN 15 11  CREATININE 0.75 0.66  CALCIUM 9.4 8.8*   LFT Recent Labs    06/15/23 1455  PROT 7.3  ALBUMIN 4.3  AST 16  ALT 14  ALKPHOS 81  BILITOT 1.0   PT/INR Recent Labs    06/16/23 0422  LABPROT 14.6   INR 1.1    Radiology/Studies CT Angio Chest PE W and/or Wo Contrast Result Date: 06/15/2023 CLINICAL DATA:  Pulmonary embolism (PE) suspected, high prob Pt arrived via POV c/o chest pain that radiates to her left arm and back. Pt does endorse nausea, dizziness EXAM: CT ANGIOGRAPHY CHEST WITH CONTRAST TECHNIQUE: Multidetector CT imaging of the chest was performed using the standard protocol during bolus administration of intravenous contrast. Multiplanar CT image reconstructions and MIPs were obtained to evaluate the vascular anatomy. RADIATION DOSE REDUCTION: This exam was performed according to the departmental dose-optimization program which includes automated exposure control, adjustment of the mA and/or kV according to patient size and/or use of iterative reconstruction technique. CONTRAST:  75mL OMNIPAQUE IOHEXOL 350 MG/ML SOLN COMPARISON:  Chest XR, earlier same day.  CT chest, 09/24/2022. FINDINGS: Cardiovascular: Satisfactory opacification of the pulmonary arteries to the segmental level. No segmental or larger pulmonary embolus. Normal heart size. No pericardial effusion. Mediastinum/Nodes: Surgical clips at the lateral RIGHT breast and axilla. No enlarged mediastinal, hilar, or axillary lymph nodes. Thyroid gland and trachea demonstrate no significant findings. Esophageal distention with heterogeneously-dense debris, and transition at the esophageal hiatus. Lungs/Pleura: Increased AP thoracic diameter. Mild-to-moderate emphysematous lung change, greater within the apices. Lungs are otherwise clear without focal consolidation, mass or suspicious pulmonary nodule. No  pleural effusion or pneumothorax. Upper Abdomen: No acute abnormality. Musculoskeletal: No acute chest wall abnormality. No acute or significant osseous findings. Review of the MIP images confirms the above findings. IMPRESSION: 1. No segmental or larger pulmonary embolus. 2. Esophageal distention with ingested debris, and transition at  the esophageal hiatus. Acute aspiration risk suspected. 3.  Emphysema (ICD10-J43.9). These results will be called to the ordering clinician or representative by the Radiologist Assistant, and communication documented in the PACS or [employer]. Electronically Signed   By: Roanna Banning M.D.   On: 06/15/2023 17:22   DG Chest Port 1 View Result Date: 06/15/2023 CLINICAL DATA:  Chest pain. EXAM: PORTABLE CHEST 1 VIEW COMPARISON:  Chest radiograph dated 04/29/2022. FINDINGS: Emphysema. No focal consolidation, pleural effusion, pneumothorax. The cardiac silhouette is within normal limits. No acute osseous pathology. IMPRESSION: 1. No active disease. 2. Emphysema. Electronically Signed   By: Elgie Collard M.D.   On: 06/15/2023 16:39     Assessment   Eveleen R Baskin is a 63 y.o. year old female with history of arthritis, breast cancer, gout, osteoporosis, Raynaud's disease, scleroderma who presented to the ED who presented with central chest pain radiating to the back.  CTA done showed dilated, debris-filled esophagus with concern for obstructive distal mass.  GI consulted for further evaluation  Chest pain/concern for esophageal mass: acute onset of chest pain with transition to dysphagia/odynophagia and worsening of pain postprandially, symptom onset last wednesday. She denies any nsaids, new medications or GERD history. No rectal bleeding or melena. CT done in the ER with concern for possible esophageal mass. Recommend proceeding with EGD today for further evaluation.   Patient has been NPO since midnight. Indications, risks and benefits of procedure discussed in detail with patient. Patient verbalized understanding and is in agreement to proceed with EGD this afternoon.    Plan / Recommendations   Remain n.p.o. Plan for EGD later today with Dr. Jena Gauss Further recommendations to follow.     06/16/2023, 9:13 AM Balraj Brayfield L. Jeanmarie Hubert, MSN, APRN, AGNP-C Adult-Gerontology Nurse  Practitioner [facility] at [facility]

## 2023-06-16 NOTE — ED Notes (Signed)
Pt ambulated to restroom with assistance.

## 2023-06-16 NOTE — Progress Notes (Addendum)
PROGRESS NOTE  Doris Bender    DOB: 01-08-1961, 63 y.o.  ZOX:[id number]    Code Status: Full Code   DOA: 06/15/2023   LOS: 1   Brief hospital course  Doris Bender is a 63 y.o. female with Past medical history of COPD, breast cancer, osteoporosis, migraine. They presented at [facility] ED with midsternal chest pain radiated to left arm and back.  Patient was having some nausea and dizziness, shortness of breath for past 1 week.  Patient is also experiencing difficulty with food ingestion, having regurgitation of phlegm, no food particles.  Patient feels pressure at the midsternal area. Unable to eat since Monday morning.     ED Course: Tmax 100.6, HR 104, RR 18, BP 148/89, 97% on room air Troponin negative x 2 CMP BG 121, rest within normal range CBC within normal range Negative COVID, RSV and flu CXR: 1. No active disease. 2. Emphysema. CTA chest: 1. No segmental or larger pulmonary embolus. 2. Esophageal distention with ingested debris, and transition at the esophageal hiatus. Acute aspiration risk suspected. 3.  Emphysema GI was consulted.   06/16/23 -EGD with esophageal dilation completed today. Will trial with clear fluids. Grossly appears to be malignant growth causing the obstruction as well as likely esophageal candidal infection. Biopsies are sent.   Assessment & Plan  Principal Problem:   Esophageal dilatation  # Esophageal constriction- unable to tolerate PO since Monday morning. EGD today for dilation showed esophageal plaques, extrinsic compression of GE junction, 4x4cm neoplastic appearing mass with retained gastric/food contents. 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 KOH prep pending - diflucan IV followed by  oral if KOH comes back positive   # COPD, no active issues Resumed albuterol as needed   # Anxiety, continued Xanax as needed home dose   Body mass index is 22.12 kg/m.  VTE ppx: enoxaparin (LOVENOX) injection 40 mg Start: 06/15/23 1830   Diet:     Diet   Diet NPO time specified   Consultants: GI  Subjective 06/16/23    Pt reports feeling less pressure this morning since being npo. Denies dyspepsia. Mouth is very dry. She is looking forward to eating.  Had small BM yesterday   Objective   Vitals:   06/16/23 0200 06/16/23 0300 06/16/23 0400 06/16/23 0600  BP: (!) 117/57 109/63 129/72 122/69  Pulse: 73 86 84 82  Resp:      Temp:      TempSrc:      SpO2: 96% 95% 95% 95%  Weight:      Height:        Intake/Output Summary (Last 24 hours) at 06/16/2023 0803 Last data filed at 06/16/2023 0631 Gross per 24 hour  Intake 1001.29 ml  Output --  Net 1001.29 ml   Filed Weights   06/15/23 1430  Weight: 60.3 kg     Physical Exam:  General: awake, alert, NAD HEENT: atraumatic, clear conjunctiva, anicteric sclera, dry mucous membranes hearing grossly normal Respiratory: normal respiratory effort. Cardiovascular: quick capillary refill, normal S1/S2, RRR, no JVD, murmurs Gastrointestinal: soft, NT, ND Nervous: A&O x3. no gross focal neurologic deficits, normal speech Extremities: moves all equally, no edema, normal tone Skin: dry, intact, normal temperature, normal color.  No rashes, lesions or ulcers on exposed skin Psychiatry: normal mood, congruent affect  Labs   I have personally reviewed the following labs and imaging studies CBC    Component Value Date/Time   WBC 5.8 06/16/2023 0422   RBC 3.92 06/16/2023 0422   HGB 11.5 (L) 06/16/2023 0422   HGB 12.8 08/08/2014 1450   HCT 34.7 (L) 06/16/2023 0422   HCT 39.3 08/08/2014 1450   PLT 176 06/16/2023 0422   PLT 173 08/08/2014 1450   MCV 88.5 06/16/2023 0422   MCV 89.4 08/08/2014 1450   MCH 29.3 06/16/2023 0422    MCHC 33.1 06/16/2023 0422   RDW 13.2 06/16/2023 0422   RDW 13.0 08/08/2014 1450   LYMPHSABS 0.6 (L) 06/15/2023 1455   LYMPHSABS 1.0 08/08/2014 1450   MONOABS 0.5 06/15/2023 1455   MONOABS 0.2 08/08/2014 1450   EOSABS 0.0 06/15/2023 1455   EOSABS 0.1 08/08/2014 1450   BASOSABS 0.0 06/15/2023 1455   BASOSABS 0.0 08/08/2014 1450      Latest Ref Rng & Units 06/16/2023    4:22 AM 06/15/2023    2:55 PM 09/02/2022   12:11 PM  BMP  Glucose 70 - 99 mg/dL 409  811  97   BUN 8 - 23 mg/dL 11  15  14    Creatinine 0.44 - 1.00 mg/dL 9.14  7.82  9.56   BUN/Creat Ratio 6 - 22 (calc)   SEE NOTE:   Sodium 135 - 145 mmol/L 135  135  140   Potassium 3.5 - 5.1 mmol/L 4.1  4.2  4.4   Chloride 98 - 111 mmol/L 101  97  100   CO2 22 - 32 mmol/L 25  26  30    Calcium 8.9 - 10.3 mg/dL 8.8  9.4  9.1     CT Angio Chest PE W and/or Wo Contrast Result Date: 06/15/2023 CLINICAL DATA:  Pulmonary embolism (PE) suspected, high prob Pt arrived via POV c/o chest pain that radiates to her left arm and back. Pt does endorse nausea, dizziness EXAM: CT ANGIOGRAPHY CHEST WITH CONTRAST TECHNIQUE: Multidetector CT imaging of the chest was performed using the standard protocol during bolus administration of intravenous contrast. Multiplanar CT image reconstructions and MIPs were obtained to evaluate the vascular anatomy. RADIATION DOSE REDUCTION: This exam was performed according to the departmental dose-optimization program which includes automated exposure control, adjustment of the mA and/or kV according to patient size and/or use of iterative reconstruction technique. CONTRAST:  75mL OMNIPAQUE IOHEXOL 350 MG/ML SOLN COMPARISON:  Chest XR, earlier same day.  CT chest, 09/24/2022. FINDINGS: Cardiovascular: Satisfactory opacification of the pulmonary arteries to the segmental level. No segmental or larger pulmonary embolus. Normal heart size. No pericardial effusion. Mediastinum/Nodes: Surgical clips at the lateral RIGHT breast and  axilla. No enlarged mediastinal, hilar, or axillary lymph nodes. Thyroid gland and trachea demonstrate no significant findings. Esophageal distention with heterogeneously-dense debris, and transition at the esophageal hiatus. Lungs/Pleura: Increased AP thoracic diameter. Mild-to-moderate emphysematous lung change, greater within the apices. Lungs are otherwise clear without focal consolidation, mass or suspicious pulmonary nodule. No pleural effusion or pneumothorax. Upper Abdomen: No acute abnormality. Musculoskeletal: No acute chest wall abnormality. No acute or significant osseous findings. Review of the MIP images confirms the above findings. IMPRESSION: 1. No segmental or larger pulmonary embolus. 2. Esophageal distention with ingested debris, and transition at the esophageal hiatus. Acute aspiration risk suspected. 3.  Emphysema (ICD10-J43.9). These results will be called to the ordering clinician or representative by the  Printmaker, and communication documented in the PACS or [employer]. Electronically Signed   By: Roanna Banning M.D.   On: 06/15/2023 17:22   DG Chest Port 1 View Result Date: 06/15/2023 CLINICAL DATA:  Chest pain. EXAM: PORTABLE CHEST 1 VIEW COMPARISON:  Chest radiograph dated 04/29/2022. FINDINGS: Emphysema. No focal consolidation, pleural effusion, pneumothorax. The cardiac silhouette is within normal limits. No acute osseous pathology. IMPRESSION: 1. No active disease. 2. Emphysema. Electronically Signed   By: Elgie Collard M.D.   On: 06/15/2023 16:39    Disposition Plan & Communication  Patient status: Inpatient  Admitted From: Home Planned disposition location: Home Anticipated discharge date: 2/13 pending diet advancement and oncology consult  Family Communication: son    Author: Leeroy Bock, DO [facility] 06/16/2023, 8:03 AM   Available by Epic secure chat 7AM-7PM. If 7PM-7AM, please contact night-coverage.  [facility] contact information  found on [email or web address].

## 2023-06-16 NOTE — Transfer of Care (Signed)
Immediate Anesthesia Transfer of Care Note  Patient: Doris Bender  Procedure(s) Performed: ESOPHAGOGASTRODUODENOSCOPY (EGD) WITH PROPOFOL BIOPSY ESOPHAGEAL BRUSHING  Patient Location: PACU  Anesthesia Type:General  Level of Consciousness: drowsy and patient cooperative  Airway & Oxygen Therapy: Patient Spontanous Breathing  Post-op Assessment: Report given to RN and Post -op Vital signs reviewed and stable  Post vital signs: Reviewed and stable  Last Vitals:  Vitals Value Taken Time  BP    Temp    Pulse    Resp    SpO2      Last Pain:  Vitals:   06/16/23 1349  TempSrc:   PainSc: 5          Complications: No notable events documented.

## 2023-06-17 ENCOUNTER — Encounter (HOSPITAL_COMMUNITY): Payer: Self-pay | Admitting: Internal Medicine

## 2023-06-17 DIAGNOSIS — K2289 Other specified disease of esophagus: Secondary | ICD-10-CM | POA: Diagnosis not present

## 2023-06-17 LAB — CBC
HCT: 36.6 % (ref 36.0–46.0)
Hemoglobin: 11.4 g/dL — ABNORMAL LOW (ref 12.0–15.0)
MCH: 28.1 pg (ref 26.0–34.0)
MCHC: 31.1 g/dL (ref 30.0–36.0)
MCV: 90.4 fL (ref 80.0–100.0)
Platelets: 181 10*3/uL (ref 150–400)
RBC: 4.05 MIL/uL (ref 3.87–5.11)
RDW: 13 % (ref 11.5–15.5)
WBC: 4.6 10*3/uL (ref 4.0–10.5)
nRBC: 0 % (ref 0.0–0.2)

## 2023-06-17 MED ORDER — FLUCONAZOLE 200 MG PO TABS: 200.0000 mg | ORAL_TABLET | Freq: Every day | ORAL | 0 refills | Status: AC

## 2023-06-17 MED ORDER — ONDANSETRON HCL 4 MG PO TABS: 4.0000 mg | ORAL_TABLET | Freq: Every day | ORAL | 1 refills | Status: DC | PRN

## 2023-06-17 MED ORDER — ONDANSETRON HCL 4 MG/2ML IJ SOLN
4.0000 mg | Freq: Four times a day (QID) | INTRAMUSCULAR | Status: DC | PRN
  Administered 2023-06-17: 4 mg via INTRAVENOUS
  Filled 2023-06-17: qty 2

## 2023-06-17 NOTE — Discharge Instructions (Signed)
Please follow up with Oncology. You will be called with appointment date and time.  Please continue on a full liquid diet at discharge and gradually advance to a soft diet as tolerated.

## 2023-06-17 NOTE — Plan of Care (Signed)
Problem: Education: Goal: Knowledge of General Education information will improve Description Including pain rating scale, medication(s)/side effects and non-pharmacologic comfort measures Outcome: Progressing   Problem: Health Behavior/Discharge Planning: Goal: Ability to manage health-related needs will improve Outcome: Progressing

## 2023-06-17 NOTE — Discharge Summary (Signed)
Physician Discharge Summary   Patient: Doris Bender MRN: [id number] DOB: [birth year]  Admit date:     06/15/2023  Discharge date: 06/17/23  Discharge Physician: MDALA-GAUSI, Gwenette Greet   PCP: Richardean Chimera, MD   Recommendations at discharge:   Follow-up with oncology. Follow-up biopsy results  Discharge Diagnoses: Principal Problem:   Esophageal dilatation  Resolved Problems:   * No resolved hospital problems. *  Hospital Course: 63 year old woman with PMH of COPD, breast cancer, osteoporosis, migraine who presented to the ED with chest pain, nausea, dysphagia.  Troponin was not elevated.  CTA-PE was negative for pulmonary embolus but showed esophageal distention with ingested debris and transition at esophageal hiatus.  Gastroenterology was consulted and patient underwent EGD on 06/16/2023.  Findings were as follows: 1.  Plaques overlying esophageal mucosa suspicious for Candida 2.  Centrally depressed 4 x 0.4 cm GE junction neoplastic appearing process producing some external compression of the GE junction.  This was biopsied. 3.  Retained gastric contents precluded complete examination of the stomach. 4.  Normal duodenal bulb and second portion of the duodenum.  The patient was started on a clear liquid diet post procedurally.  She was also started on fluconazole. She tolerated a clear liquid diet.  The patient was informed of the EGD findings including the suspicion for a neoplastic process.  Ambulatory referral was placed to oncology. Patient's diet was advanced to full liquid diet and she was advised to continue with this diet and advance as tolerated.  Patient was discharged home in stable condition and with a prescription for fluconazole.  She is to follow-up outpatient with oncology with biopsy results.   Consultants: Gastroenterology Procedures performed: EGD Disposition: Home Diet recommendation:  Discharge Diet Orders (From admission, onward)     Start      Ordered   06/17/23 0000  Diet full liquid       Comments: Advance as tolerated.   06/17/23 1418           Full liquid diet DISCHARGE MEDICATION: Allergies as of 06/17/2023       Reactions   Hydroxychloroquine Nausea And Vomiting, Other (See Comments)   Back, stomach/ abd pain.   Levofloxacin Other (See Comments)   Hallucinations, Dizziness.    Prednisone Nausea And Vomiting   Other reaction(s): Abdominal Pain   Doxycycline    dizzy   Ibuprofen Hives   Keflex [cephalexin] Hives   Ketorolac Hives   Lodine [etodolac] Hives   Methocarbamol Nausea Only   Oxycodone Nausea And Vomiting   Oxycodone Nausea Only   Robaxin [methocarbamol] Hives        Medication List     TAKE these medications    acetaminophen 650 MG CR tablet Commonly known as: TYLENOL Take 650 mg by mouth every 8 (eight) hours as needed for pain.   albuterol 108 (90 Base) MCG/ACT inhaler Commonly known as: VENTOLIN HFA Inhale 1-2 puffs into the lungs every 6 (six) hours as needed for wheezing or shortness of breath.   ALPRAZolam 0.25 MG tablet Commonly known as: XANAX Take 0.25 mg by mouth as needed for anxiety.   fluconazole 200 MG tablet Commonly known as: DIFLUCAN Take 1 tablet (200 mg total) by mouth daily for 12 days. Start taking on: June 18, 2023   HYDROcodone-acetaminophen 5-325 MG tablet Commonly known as: NORCO/VICODIN Take 1 tablet by mouth every 6 (six) hours as needed for moderate pain. What changed: how much to take   ondansetron 4 MG tablet Commonly  known as: Zofran Take 1 tablet (4 mg total) by mouth daily as needed for nausea or vomiting.        Discharge Exam: Filed Weights   06/15/23 1430 06/16/23 0852  Weight: 60.3 kg 59.9 kg   Physical Exam on Day of Discharge   General: Alert, cheerful, oriented X3  Oral cavity: moist mucous membranes  Neck: supple  Chest: clear to auscultation. No crackles, no wheezes  CVS: S1,S2 RRR. No murmurs  Abd: No  distention, soft, non-tender. No masses palpable  Extr: No edema    Condition at discharge: stable  The results of significant diagnostics from this hospitalization (including imaging, microbiology, ancillary and laboratory) are listed below for reference.   Imaging Studies: CT Angio Chest PE W and/or Wo Contrast Result Date: 06/15/2023 CLINICAL DATA:  Pulmonary embolism (PE) suspected, high prob Pt arrived via POV c/o chest pain that radiates to her left arm and back. Pt does endorse nausea, dizziness EXAM: CT ANGIOGRAPHY CHEST WITH CONTRAST TECHNIQUE: Multidetector CT imaging of the chest was performed using the standard protocol during bolus administration of intravenous contrast. Multiplanar CT image reconstructions and MIPs were obtained to evaluate the vascular anatomy. RADIATION DOSE REDUCTION: This exam was performed according to the departmental dose-optimization program which includes automated exposure control, adjustment of the mA and/or kV according to patient size and/or use of iterative reconstruction technique. CONTRAST:  75mL OMNIPAQUE IOHEXOL 350 MG/ML SOLN COMPARISON:  Chest XR, earlier same day.  CT chest, 09/24/2022. FINDINGS: Cardiovascular: Satisfactory opacification of the pulmonary arteries to the segmental level. No segmental or larger pulmonary embolus. Normal heart size. No pericardial effusion. Mediastinum/Nodes: Surgical clips at the lateral RIGHT breast and axilla. No enlarged mediastinal, hilar, or axillary lymph nodes. Thyroid gland and trachea demonstrate no significant findings. Esophageal distention with heterogeneously-dense debris, and transition at the esophageal hiatus. Lungs/Pleura: Increased AP thoracic diameter. Mild-to-moderate emphysematous lung change, greater within the apices. Lungs are otherwise clear without focal consolidation, mass or suspicious pulmonary nodule. No pleural effusion or pneumothorax. Upper Abdomen: No acute abnormality. Musculoskeletal: No  acute chest wall abnormality. No acute or significant osseous findings. Review of the MIP images confirms the above findings. IMPRESSION: 1. No segmental or larger pulmonary embolus. 2. Esophageal distention with ingested debris, and transition at the esophageal hiatus. Acute aspiration risk suspected. 3.  Emphysema (ICD10-J43.9). These results will be called to the ordering clinician or representative by the Radiologist Assistant, and communication documented in the PACS or [employer]. Electronically Signed   By: Roanna Banning M.D.   On: 06/15/2023 17:22   DG Chest Port 1 View Result Date: 06/15/2023 CLINICAL DATA:  Chest pain. EXAM: PORTABLE CHEST 1 VIEW COMPARISON:  Chest radiograph dated 04/29/2022. FINDINGS: Emphysema. No focal consolidation, pleural effusion, pneumothorax. The cardiac silhouette is within normal limits. No acute osseous pathology. IMPRESSION: 1. No active disease. 2. Emphysema. Electronically Signed   By: Elgie Collard M.D.   On: 06/15/2023 16:39   XR Finger Little Right Result Date: 06/03/2023 X-rays of the right small finger, multi views were obtained today X-rays demonstrate previously known bony mallet injury, with a corticated dorsal fragment.  DIP does remain well located without significant volar subluxation.   Microbiology: Results for orders placed or performed during the hospital encounter of 06/15/23  Resp panel by RT-PCR (RSV, Flu A&B, Covid) Anterior Nasal Swab     Status: None   Collection Time: 06/15/23  2:27 PM   Specimen: Anterior Nasal Swab  Result Value Ref Range  Status   SARS Coronavirus 2 by RT PCR NEGATIVE NEGATIVE Final    Comment: (NOTE) SARS-CoV-2 target nucleic acids are NOT DETECTED.  The SARS-CoV-2 RNA is generally detectable in upper respiratory specimens during the acute phase of infection. The lowest concentration of SARS-CoV-2 viral copies this assay can detect is 138 copies/mL. A negative result does not preclude  SARS-Cov-2 infection and should not be used as the sole basis for treatment or other patient management decisions. A negative result may occur with  improper specimen collection/handling, submission of specimen other than nasopharyngeal swab, presence of viral mutation(s) within the areas targeted by this assay, and inadequate number of viral copies(<138 copies/mL). A negative result must be combined with clinical observations, patient history, and epidemiological information. The expected result is Negative.  Fact Sheet for Patients:  BloggerCourse.com  Fact Sheet for Healthcare Providers:  SeriousBroker.it  This test is no t yet approved or cleared by the [place] FDA and  has been authorized for detection and/or diagnosis of SARS-CoV-2 by FDA under an Emergency Use Authorization (EUA). This EUA will remain  in effect (meaning this test can be used) for the duration of the COVID-19 declaration under Section 564(b)(1) of the Act, 21 U.S.C.section 360bbb-3(b)(1), unless the authorization is terminated  or revoked sooner.       Influenza A by PCR NEGATIVE NEGATIVE Final   Influenza B by PCR NEGATIVE NEGATIVE Final    Comment: (NOTE) The Xpert Xpress SARS-CoV-2/FLU/RSV plus assay is intended as an aid in the diagnosis of influenza from Nasopharyngeal swab specimens and should not be used as a sole basis for treatment. Nasal washings and aspirates are unacceptable for Xpert Xpress SARS-CoV-2/FLU/RSV testing.  Fact Sheet for Patients: BloggerCourse.com  Fact Sheet for Healthcare Providers: SeriousBroker.it  This test is not yet approved or cleared by the [place] FDA and has been authorized for detection and/or diagnosis of SARS-CoV-2 by FDA under an Emergency Use Authorization (EUA). This EUA will remain in effect (meaning this test can be used) for the duration of  the COVID-19 declaration under Section 564(b)(1) of the Act, 21 U.S.C. section 360bbb-3(b)(1), unless the authorization is terminated or revoked.     Resp Syncytial Virus by PCR NEGATIVE NEGATIVE Final    Comment: (NOTE) Fact Sheet for Patients: BloggerCourse.com  Fact Sheet for Healthcare Providers: SeriousBroker.it  This test is not yet approved or cleared by the [place] FDA and has been authorized for detection and/or diagnosis of SARS-CoV-2 by FDA under an Emergency Use Authorization (EUA). This EUA will remain in effect (meaning this test can be used) for the duration of the COVID-19 declaration under Section 564(b)(1) of the Act, 21 U.S.C. section 360bbb-3(b)(1), unless the authorization is terminated or revoked.  Performed at [facility], [address], [place], [place] [address]   [facility] prep     Status: None   Collection Time: 06/16/23  2:05 PM   Specimen: PATH Cytology brushing; Body Fluid  Result Value Ref Range Status   Specimen Description ESOPHAGUS  Final   Special Requests NONE  Final   KOH Prep   Final    BUDDING YEAST SEEN Performed at [facility], [address], [place], [place] [address]    Report Status 06/16/2023 FINAL  Final    Labs: CBC: Recent Labs  Lab 06/15/23 1455 06/16/23 0422 06/17/23 0337  WBC 8.5 5.8 4.6  NEUTROABS 7.3  --   --   HGB 12.3 11.5* 11.4*  HCT 38.0 34.7* 36.6  MCV 89.6 88.5 90.4  PLT 225 176 181   Basic Metabolic Panel: Recent Labs  Lab 06/15/23 1455 06/16/23 0422  NA 135 135  K 4.2 4.1  CL 97* 101  CO2 26 25  GLUCOSE 121* 112*  BUN 15 11  CREATININE 0.75 0.66  CALCIUM 9.4 8.8*  MG 2.2 2.1  PHOS  --  3.5   Liver Function Tests: Recent Labs  Lab 06/15/23 1455  AST 16  ALT 14  ALKPHOS 81  BILITOT 1.0  PROT 7.3  ALBUMIN 4.3   CBG: No results for input(s): "GLUCAP" in the last 168 hours.  Discharge time spent: greater than 30  minutes.  Signed: MDALA-GAUSI, Gwenette Greet, MD [facility] 06/17/2023

## 2023-06-17 NOTE — Plan of Care (Signed)
  Problem: Health Behavior/Discharge Planning: Goal: Ability to manage health-related needs will improve Outcome: Progressing   Problem: Pain Managment: Goal: General experience of comfort will improve and/or be controlled Outcome: Progressing

## 2023-06-21 LAB — SURGICAL PATHOLOGY

## 2023-06-23 ENCOUNTER — Inpatient Hospital Stay: Payer: Commercial Managed Care - PPO

## 2023-06-23 ENCOUNTER — Inpatient Hospital Stay: Payer: Commercial Managed Care - PPO | Attending: Oncology | Admitting: Oncology

## 2023-06-23 VITALS — BP 116/76 | HR 90 | Temp 98.3°F | Resp 20 | Ht 65.0 in | Wt 131.6 lb

## 2023-06-23 DIAGNOSIS — R634 Abnormal weight loss: Secondary | ICD-10-CM | POA: Diagnosis not present

## 2023-06-23 DIAGNOSIS — Z87891 Personal history of nicotine dependence: Secondary | ICD-10-CM | POA: Insufficient documentation

## 2023-06-23 DIAGNOSIS — Z170 Estrogen receptor positive status [ER+]: Secondary | ICD-10-CM | POA: Diagnosis not present

## 2023-06-23 DIAGNOSIS — C50412 Malignant neoplasm of upper-outer quadrant of left female breast: Secondary | ICD-10-CM | POA: Insufficient documentation

## 2023-06-23 DIAGNOSIS — C160 Malignant neoplasm of cardia: Secondary | ICD-10-CM | POA: Diagnosis present

## 2023-06-23 DIAGNOSIS — Z98818 Other dental procedure status: Secondary | ICD-10-CM | POA: Insufficient documentation

## 2023-06-23 NOTE — Assessment & Plan Note (Signed)
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 CT scan did not show any lung metastasis. History of smoking and previous breast cancer. -Schedule PET scan for staging. -Schedule endoscopic ultrasound (EUS)  in [facility] for further staging. -Consult with a nutritionist to manage weight loss and dysphagia. -Discuss potential need for temporary feeding tube placement. -Consider chemotherapy, radiation, and/or surgery based on staging results.  Return to clinic in 3 weeks to discuss further management.

## 2023-06-23 NOTE — Assessment & Plan Note (Signed)
Planned dental implant surgery. -Postpone dental implant surgery due to potential for infection and delayed healing with upcoming chemotherapy.

## 2023-06-23 NOTE — Assessment & Plan Note (Signed)
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  S/p lumpectomy, radiation and AI.  Patient did not tolerate AI very well and did not complete 5 years.

## 2023-06-23 NOTE — Patient Instructions (Addendum)
[facility]  Discharge Instructions  You were seen and examined today by Dr. Anders Simmonds. Dr. Anders Simmonds is a medical oncologist, meaning that he specializes in the treatment of cancer diagnoses. Dr. Anders Simmonds discussed your past medical history, family history of cancers, and the events that led to you being here today.  You were referred to Dr. Anders Simmonds due to an abnormal EGD result, which revealed a cancer known as adenocarcinoma. It is arising from either the esophagus (food pipe) or at the junction of your stomach and esophagus. We will ask a specialized GI doctor to do a specialized ultrasound and repeat biopsy to identify exactly where the cancer is arising from so that we can best treat you.  Dr. Anders Simmonds will order a PET scan, which is a specialized CT scan that illuminates where there is cancer present in the body to accurately identify the stage of the cancer.  We will ask our nutritionist to reach out to you. It is highly likely that you may need a feeding tube in order to tolerate treatment without losing weight.  We will also send your biopsy for additional testing.  Follow-up as scheduled.  Thank you for choosing [facility] to provide your oncology and hematology care.   To afford each patient quality time with our provider, please arrive at least 15 minutes before your scheduled appointment time. You may need to reschedule your appointment if you arrive late (10 or more minutes). Arriving late affects you and other patients whose appointments are after yours.  Also, if you miss three or more appointments without notifying the office, you may be dismissed from the clinic at the provider's discretion.    Again, thank you for choosing [facility].  Our hope is that these requests will decrease the amount of time that you wait before being seen by our physicians.   If you have a lab appointment with the Cancer Center - please  note that after April 8th, all labs will be drawn in the cancer center.  You do not have to check in or register with the main entrance as you have in the past but will complete your check-in at the cancer center.            _____________________________________________________________  Should you have questions after your visit to [facility], please contact our office at [phone] and follow the prompts.  Our office hours are 8:00 a.m. to 4:30 p.m. Monday - Thursday and 8:00 a.m. to 2:30 p.m. Friday.  Please note that voicemails left after 4:00 p.m. may not be returned until the following business day.  We are closed weekends and all major holidays.  You do have access to a nurse 24-7, just call the main number to the clinic [phone] and do not press any options, hold on the line and a nurse will answer the phone.    For prescription refill requests, have your pharmacy contact our office and allow 72 hours.    Masks are no longer required in the cancer centers. If you would like for your care team to wear a mask while they are taking care of you, please let them know. You may have one support person who is at least 65 years old accompany you for your appointments.

## 2023-06-23 NOTE — Progress Notes (Signed)
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 Summary of oncologic history is as follows: Oncology History  GE junction carcinoma (HCC)  06/15/2023 Imaging   CT PE chest:  Esophageal distention with ingested debris, and transition at the esophageal hiatus.    06/16/2023 Procedure   Endoscopy: -Centrally depressed 4 x 4 cm GE junction ( cardia) neoplastic appearing process   06/16/2023 Pathology Results   FINAL MICROSCOPIC DIAGNOSIS:   A. GE JUNCTION, BIOPSY:  Poorly differentiated adenocarcinoma.    06/23/2023 Initial Diagnosis   GE junction carcinoma (HCC)   06/23/2023 Cancer Staging   Staging form: Esophagus - Adenocarcinoma, AJCC 6th Edition - Clinical: No stage assigned - Signed by Cindie Crumbly, MD on 06/23/2023 Staged by: Not Staged   06/23/2023 Cancer Staging   Staging form: Esophagus - Adenocarcinoma, AJCC 6th Edition - Pathologic: Stage Unknown (TX, NX, MX) - Signed by Cindie Crumbly, MD on 06/23/2023       History of Presenting Illness: Doris Bender 63 y.o. female is here because of recent diagnosis of GE junction adenocarcinoma.  She is accompanied by her son today.  Patient has a history of COPD, arthritis, Raynaud's phenomenon and previous breast cancer.    She had recent difficulty swallowing and significant weight loss for the past couple weeks and was in the ER for chest pain.  During this time a CT showed esophageal dilatation and an endoscopy after that showed a GE junction tumor.  Biopsy of this revealed adenocarcinoma.  Patient stated that her symptoms started 2 weeks ago with difficulty swallowing of solids and then continued to include liquids.  She has since lost significant amount of weight.  She was relatively healthy prior to this and did not have any  complaints of acid reflux.  Patient is of good functional status and is able to do all her activities by herself.  Patient was a former smoker quit smoking in 2022.  Does not use alcohol.  Lives in [place] alone.  And is retired since 2022.  Discussed with patient the diagnosis of recent GE junction adenocarcinoma.  Staging information is not available at this time.  Recommended obtaining a PET scan and also EUS with biopsy for staging purposes.  Discussed briefly that patient might require a combination of chemotherapy, radiation and probably surgery based on her staging.  Also discussed briefly the patient might need feeding tube for nutritional purposes.  All the questions and concerns were answered in detail.   Medical History: Past Medical History:  Diagnosis Date   Arthritis    lt great toe (RA)   Breast cancer (HCC) 11/15/2012   left   Cancer (HCC)    breast   Compression fracture of L4 lumbar vertebra 11/04/2012   injury   Contact lens/glasses fitting    wears contacts or glasses   Gout 8-9 yrs ago   Rt heel/foot   History of radiation therapy 02/20/13-04/07/13   left breast 50.4 gray, upper outer quadrant boosted to 62.4 gray   Osteoporosis    Pseudogout of joint of left foot 6 yrs ago   Raynaud's disease    bilateral hands and feet   Scleroderma (HCC)    Wears partial dentures    partial lower    Surgical history: Past Surgical History:  Procedure Laterality Date   BIOPSY  06/16/2023   Procedure: BIOPSY;  Surgeon: Corbin Ade, MD;  [facility];  Service: Endoscopy;;   BREAST LUMPECTOMY WITH NEEDLE LOCALIZATION AND AXILLARY SENTINEL LYMPH NODE BX Left 12/21/2012   Procedure: BREAST LUMPECTOMY WITH NEEDLE LOCALIZATION AND AXILLARY SENTINEL LYMPH NODE BX;  Surgeon: Currie Paris, MD;  Location: [facility];  Service: General;  Laterality: Left;  needle local BCG     BREAST SURGERY  age 19   left breast bx- benign   CERVICAL  CONIZATION W/BX  2012   benign   ELBOW SURGERY     ESOPHAGEAL BRUSHING  06/16/2023   Procedure: ESOPHAGEAL BRUSHING;  Surgeon: Corbin Ade, MD;  [facility];  Service: Endoscopy;;   ESOPHAGOGASTRODUODENOSCOPY (EGD) WITH PROPOFOL N/A 06/16/2023   Procedure: ESOPHAGOGASTRODUODENOSCOPY (EGD) WITH PROPOFOL;  Surgeon: Corbin Ade, MD;  [facility];  Service: Endoscopy;  Laterality: N/A;   KNEE ARTHROPLASTY     KNEE ARTHROSCOPY  2006   right   LATERAL EPICONDYLE RELEASE  03/08/2012   Procedure: TENNIS ELBOW RELEASE;  Surgeon: Wyn Forster., MD;  Location: [facility];  Service: Orthopedics;  Laterality: Right;  RECONSTRUCTION OF RIGHT LATERAL ELBOW WITH TENDONS   MASS EXCISION  03/08/2012   Procedure: EXCISION MASS;  Surgeon: Wyn Forster., MD;  Location: [facility];  Service: Orthopedics;  Laterality: Right;  EXCISION OF RIGHT WRIST DORSAL CYST   TENNIS ELBOW RELEASE/NIRSCHEL PROCEDURE Left 09/12/2015   Procedure: ARTHROTOMY LEFT ELBOW, REPAIR EXTENSOR ORIGIN;  Surgeon: Cindee Salt, MD;  Location: [facility];  Service: Orthopedics;  Laterality: Left;   TONSILLECTOMY     TONSILLECTOMY     63yrs old   WRIST GANGLION EXCISION     left x2    Social History: Social History   Socioeconomic History   Marital status: Single    Spouse name: Not on file   Number of children: Not on file   Years of education: Not on file   Highest education level: Not on file  Occupational History   Not on file  Tobacco Use   Smoking status: Former    Current packs/day: 0.00    Average packs/day: 0.8 packs/day for 50.0 years (37.5 ttl pk-yrs)    Types: Cigarettes    Start date: 03/20/1972    Quit date: 03/20/2022    Years since quitting: 1.2    Passive exposure: Never   Smokeless tobacco: Never  Vaping Use   Vaping status: Never Used  Substance and Sexual Activity   Alcohol use: Yes    Comment: occ   Drug use: No    Sexual activity: Not on file    Comment: menarche age 76, miscarriage x 1, P1,  age 62 w/birth of son, Depo-Provera x 20 yrs  Other Topics Concern   Not on file  Social History Narrative   ** Merged History Encounter **       Social Drivers of Health   Financial Resource Strain: Not on file  Food Insecurity: No Food Insecurity (06/16/2023)   Hunger Vital Sign    Worried About Running Out of Food in the Last Year: Never true    Ran Out of Food in the Last Year: Never true  Transportation Needs: No Transportation Needs (06/16/2023)   PRAPARE - Administrator, Civil Service (Medical): No    Lack of Transportation (Non-Medical): No  Physical Activity: Insufficiently Active (04/12/2023)  Received [facility]   Exercise Vital Sign    Days of Exercise per Week: 1 day    Minutes of Exercise per Session: 60 min  Stress: No Stress Concern Present (11/26/2020)   Received from [facility] - Occupational Stress Questionnaire    Feeling of Stress : Only a little  Social Connections: Unknown (09/14/2021)   [facility]   Social Network    Social Network: Not on file  Intimate Partner Violence: Not At Risk (06/16/2023)   Humiliation, Afraid, Rape, and Kick questionnaire    Fear of Current or Ex-Partner: No    Emotionally Abused: No    Physically Abused: No    Sexually Abused: No    Family History: Family History  Problem Relation Age of Onset   Hyperthyroidism Mother    Cirrhosis Father    Diabetes Brother    Heart attack Maternal Grandmother    Heart attack Maternal Grandfather     Allergies:  is allergic to hydroxychloroquine, levofloxacin, prednisone, doxycycline, ibuprofen, keflex [cephalexin], ketorolac, lodine [etodolac], methocarbamol, oxycodone, oxycodone, and robaxin [methocarbamol].  Medications:  Current Outpatient Medications  Medication Sig Dispense Refill    acetaminophen (TYLENOL) 650 MG CR tablet Take 650 mg by mouth every 8 (eight) hours as needed for pain.     albuterol (VENTOLIN HFA) 108 (90 Base) MCG/ACT inhaler Inhale 1-2 puffs into the lungs every 6 (six) hours as needed for wheezing or shortness of breath.     ALPRAZolam (XANAX) 0.25 MG tablet Take 0.25 mg by mouth as needed for anxiety.     fluconazole (DIFLUCAN) 200 MG tablet Take 1 tablet (200 mg total) by mouth daily for 12 days. 12 tablet 0   HYDROcodone-acetaminophen (NORCO/VICODIN) 5-325 MG tablet Take 1 tablet by mouth every 6 (six) hours as needed for moderate pain. (Patient taking differently: Take 0.5 tablets by mouth every 6 (six) hours as needed for moderate pain (pain score 4-6).) 20 tablet 0   ondansetron (ZOFRAN) 4 MG tablet Take 1 tablet (4 mg total) by mouth daily as needed for nausea or vomiting. 30 tablet 1   No current facility-administered medications for this visit.    Review of Systems: Constitutional: Denies fevers, chills or abnormal night sweats Eyes: Denies blurriness of vision, double vision or watery eyes Ears, nose, mouth, throat, and face: Denies mucositis or sore throat Respiratory: Denies cough, dyspnea or wheezes Cardiovascular: Denies palpitation, chest discomfort or lower extremity swelling Gastrointestinal:  Denies nausea, heartburn or change in bowel habits Skin: Denies abnormal skin rashes Lymphatics: Denies new lymphadenopathy or easy bruising Neurological:Denies numbness, tingling or new weaknesses Behavioral/Psych: Mood is stable, no new changes  All other systems were reviewed with the patient and are negative.  Physical Examination: ECOG PERFORMANCE STATUS: 0 - Asymptomatic  Vitals:   06/23/23 0945  BP: 116/76  Pulse: 90  Resp: 20  Temp: 98.3 F (36.8 C)  SpO2: 95%   Filed Weights   06/23/23 0945  Weight: 131 lb 9.6 oz (59.7 kg)    GENERAL:alert, no distress and comfortable SKIN: skin color, texture, turgor are normal, no  rashes or significant lesions LUNGS: clear to auscultation and percussion with normal breathing effort HEART: regular rate & rhythm and no murmurs and no lower extremity edema ABDOMEN:abdomen soft, non-tender and normal bowel sounds Musculoskeletal:no cyanosis of digits and no clubbing  PSYCH: alert & oriented x 3 with fluent speech  Laboratory Data: I have reviewed the data as listed Lab Results  Component Value Date   WBC 4.6 06/17/2023   HGB 11.4 (L) 06/17/2023   HCT 36.6 06/17/2023   MCV 90.4 06/17/2023   PLT 181 06/17/2023   Recent Labs    09/02/22 1211 06/15/23 1455 06/16/23 0422  NA 140 135 135  K 4.4 4.2 4.1  CL 100 97* 101  CO2 30 26 25   GLUCOSE 97 121* 112*  BUN 14 15 11   CREATININE 0.82 0.75 0.66  CALCIUM 9.1 9.4 8.8*  GFRNONAA  --  >60 >60  PROT 6.3 7.3  --   ALBUMIN  --  4.3  --   AST 14 16  --   ALT 13 14  --   ALKPHOS  --  81  --   BILITOT 0.4 1.0  --     Radiographic Studies: I have personally reviewed the radiological images as listed and agreed with the findings in the report.  CT Angio Chest PE W and/or Wo Contrast Result Date: 06/15/2023 CLINICAL DATA:  Pulmonary embolism (PE) suspected, high prob Pt arrived via POV c/o chest pain that radiates to her left arm and back. Pt does endorse nausea, dizziness EXAM: CT ANGIOGRAPHY CHEST WITH CONTRAST TECHNIQUE: Multidetector CT imaging of the chest was performed using the standard protocol during bolus administration of intravenous contrast. Multiplanar CT image reconstructions and MIPs were obtained to evaluate the vascular anatomy. RADIATION DOSE REDUCTION: This exam was performed according to the departmental dose-optimization program which includes automated exposure control, adjustment of the mA and/or kV according to patient size and/or use of iterative reconstruction technique. CONTRAST:  75mL OMNIPAQUE IOHEXOL 350 MG/ML SOLN COMPARISON:  Chest XR, earlier same day.  CT chest, 09/24/2022. FINDINGS:  Cardiovascular: Satisfactory opacification of the pulmonary arteries to the segmental level. No segmental or larger pulmonary embolus. Normal heart size. No pericardial effusion. Mediastinum/Nodes: Surgical clips at the lateral RIGHT breast and axilla. No enlarged mediastinal, hilar, or axillary lymph nodes. Thyroid gland and trachea demonstrate no significant findings. Esophageal distention with heterogeneously-dense debris, and transition at the esophageal hiatus. Lungs/Pleura: Increased AP thoracic diameter. Mild-to-moderate emphysematous lung change, greater within the apices. Lungs are otherwise clear without focal consolidation, mass or suspicious pulmonary nodule. No pleural effusion or pneumothorax. Upper Abdomen: No acute abnormality. Musculoskeletal: No acute chest wall abnormality. No acute or significant osseous findings. Review of the MIP images confirms the above findings. IMPRESSION: 1. No segmental or larger pulmonary embolus. 2. Esophageal distention with ingested debris, and transition at the esophageal hiatus. Acute aspiration risk suspected. 3.  Emphysema (ICD10-J43.9). These results will be called to the ordering clinician or representative by the Radiologist Assistant, and communication documented in the PACS or [employer]. Electronically Signed   By: Roanna Banning M.D.   On: 06/15/2023 17:22   DG Chest Port 1 View Result Date: 06/15/2023 CLINICAL DATA:  Chest pain. EXAM: PORTABLE CHEST 1 VIEW COMPARISON:  Chest radiograph dated 04/29/2022. FINDINGS: Emphysema. No focal consolidation, pleural effusion, pneumothorax. The cardiac silhouette is within normal limits. No acute osseous pathology. IMPRESSION: 1. No active disease. 2. Emphysema. Electronically Signed   By: Elgie Collard M.D.   On: 06/15/2023 16:39   XR Finger Little Right Result Date: 06/03/2023 X-rays of the right small finger, multi views were obtained today X-rays demonstrate previously known bony mallet injury, with a  corticated dorsal fragment.  DIP does remain well located without significant volar subluxation.  Endoscopy:  Impression: - Plaques  overlying esophageal mucosa suspicious for Candida esophagitis? status post KOH brushing - Centrally depressed 4 x 4 cm GE junction ( cardia) neoplastic appearing process producing some external compression on the GE junction. Status post biopsy. Retained gastric contents precluded complete examination of the stomach. - Normal duodenal bulb and second portion of the duodenum.  ASSESSMENT & PLAN:  Patient is a 63 year old female referred for newly diagnosed GE junction adenocarcinoma  Breast cancer of upper-outer quadrant of left female breast (HCC) T1 N0, ER positive, PR negative, Ki-67: 12%.  S/p lumpectomy, radiation and AI.  Patient did not tolerate AI very well and did not complete 5 years.   GE junction carcinoma (HCC) Newly diagnosed with a 4cm tumor at the esophagus-stomach junction causing dysphagia and weight loss. CT scan did not show any lung metastasis. History of smoking and previous breast cancer. -Schedule PET scan for staging. -Schedule endoscopic ultrasound (EUS)  in [facility] for further staging. -Consult with a nutritionist to manage weight loss and dysphagia. -Discuss potential need for temporary feeding tube placement. -Consider chemotherapy, radiation, and/or surgery based on staging results.  Return to clinic in 3 weeks to discuss further management.  Other dental procedure status Planned dental implant surgery. -Postpone dental implant surgery due to potential for infection and delayed healing with upcoming chemotherapy.   Orders Placed This Encounter  Procedures   NM PET Image Initial (PI) Skull Base To Thigh    Standing Status:   Future    Expected Date:   07/01/2023    Expiration Date:   06/22/2024    If indicated for the ordered procedure, I authorize the administration of a radiopharmaceutical per Radiology protocol:   Yes     Preferred imaging location?:   [facility]    Release to patient:   Immediate   Ambulatory Referral to [facility] Nutrition    Referral Priority:   Routine    Referral Type:   Consultation    Referral Reason:   Specialty Services Required    Number of Visits Requested:   1   Ambulatory referral to Gastroenterology    Referral Priority:   Urgent    Referral Type:   Consultation    Referral Reason:   Specialty Services Required    Referred to Provider:   Lemar Lofty., MD    Number of Visits Requested:   1    The total time spent in the appointment was 60 minutes encounter with patients including review of chart and various tests results, discussions about plan of care and coordination of care plan   All questions were answered. The patient knows to call the clinic with any problems, questions or concerns. No barriers to learning was detected.  Cindie Crumbly, MD 2/19/202512:39 PM

## 2023-06-24 ENCOUNTER — Encounter: Payer: Self-pay | Admitting: Physician Assistant

## 2023-06-25 ENCOUNTER — Other Ambulatory Visit: Payer: Self-pay | Admitting: Oncology

## 2023-06-25 MED ORDER — LIDOCAINE VISCOUS HCL 2 % MT SOLN: 15.0000 mL | OROMUCOSAL | 0 refills | Status: DC | PRN

## 2023-06-28 ENCOUNTER — Other Ambulatory Visit: Payer: Self-pay

## 2023-06-28 ENCOUNTER — Other Ambulatory Visit: Payer: Self-pay | Admitting: Oncology

## 2023-06-28 ENCOUNTER — Telehealth: Payer: Self-pay

## 2023-06-28 ENCOUNTER — Telehealth: Payer: Self-pay | Admitting: Dietician

## 2023-06-28 ENCOUNTER — Inpatient Hospital Stay: Payer: Commercial Managed Care - PPO | Admitting: Dietician

## 2023-06-28 DIAGNOSIS — C160 Malignant neoplasm of cardia: Secondary | ICD-10-CM

## 2023-06-28 MED ORDER — SUCRALFATE 1 GM/10ML PO SUSP: 1.0000 g | Freq: Three times a day (TID) | ORAL | 0 refills | Status: DC

## 2023-06-28 MED ORDER — SCOPOLAMINE 1 MG/3DAYS TD PT72: 1.0000 | MEDICATED_PATCH | TRANSDERMAL | 12 refills | Status: DC

## 2023-06-28 NOTE — Progress Notes (Signed)
 Order received from Dr. Anders Simmonds for patient referral to Dr. Cliffton Asters. Called patient to discuss referral, patient verbalized understanding. Patient to see Dr. Cliffton Asters on Friday, February 28th.

## 2023-06-28 NOTE — Telephone Encounter (Signed)
 Nutrition Assessment   Reason for Assessment: New GE junction   ASSESSMENT: 63 year old female with newly diagnosed GE junction carcinoma. Treatment plan currently under work-up. Patient is under the care of Dr. Anders Simmonds.   Past medical history includes Raynaud's syndrome, COPD, osteoporosis, breast cancer (2014), autoimmune disease  2/11-2/13 admission with dysphagia   Spoke with patient via telephone. Patient reports worsening dysphagia with liquids since hospital discharge. She is unable to tolerate [place] Ice or Jello which was tolerated with diet advancement s/p dilation. Patient endorses nausea, usually with episode of vomiting mid-day. Pt feels this may be related to fluconazole taken in the AM in combination with increased mucous build up in esophagus. Patient recalls drinking cup of coffee, small amount of juice, and a Boost in the mornings. It takes ~30 minutes to get one Boost down. She has to take small sips of water behind Boost. Patient is able to tolerate chicken and vegetable broth at lunch. She had 1/4 of single serve ice cream cup for dinner.    Nutrition Focused Physical Exam: deferred (telephone visit)   Medications: diflucan, xanax, norco, zofran, scopolamine, carafate   Labs: 2/12 labs reviewed   Anthropometrics:   Height: 5'5" Weight: 131 lb 9.6 oz  UBW: 135-140 lb  BMI: 21.90   NUTRITION DIAGNOSIS: Swallowing difficulty related to cancer as evidenced by reported dysphagia with solids s/p dilation   INTERVENTION:  Educated on FLD and offered ideas of high calorie/high protein shakes Suggested switching to bone broths for added protein Discussed increased saliva and dysphagia with liquids with Dr. Anders Simmonds - will trial pt on carafate + scopolamine patch Discussed importance of adequate calorie and protein energy intake to maintain wt/strength Concerns with pt ability to increase po at this time. Recommend feeding tube placement. Discussed recommendation  with patient and mother today PET 2/27, Surgical consult with Dr. Cliffton Asters planned 2/28   MONITORING, EVALUATION, GOAL: Patient will tolerate increased calories and protein to minimize further wt loss    Next Visit: Thursday March 6 via telephone

## 2023-06-28 NOTE — Telephone Encounter (Signed)
 EUS has been set up for 07/15/23 at 9 am at [facility] with GM   APPT with [facility] has been cancelled.   EUS scheduled, pt instructed and medications reviewed.  Patient instructions mailed to home.  Patient to call with any questions or concerns.

## 2023-07-01 ENCOUNTER — Ambulatory Visit (HOSPITAL_COMMUNITY)
Admission: RE | Admit: 2023-07-01 | Discharge: 2023-07-01 | Disposition: A | Discharge status: Home / Self Care | Payer: Commercial Managed Care - PPO | Source: Ambulatory Visit | Attending: Oncology | Admitting: Oncology

## 2023-07-01 DIAGNOSIS — C160 Malignant neoplasm of cardia: Secondary | ICD-10-CM | POA: Insufficient documentation

## 2023-07-02 ENCOUNTER — Institutional Professional Consult (permissible substitution): Payer: Commercial Managed Care - PPO | Admitting: Thoracic Surgery (Cardiothoracic Vascular Surgery)

## 2023-07-02 ENCOUNTER — Encounter: Payer: Self-pay | Admitting: Thoracic Surgery (Cardiothoracic Vascular Surgery)

## 2023-07-02 VITALS — BP 113/74 | HR 81 | Resp 20 | Ht 65.0 in | Wt 131.4 lb

## 2023-07-02 MED ORDER — FLUDEOXYGLUCOSE F - 18 (FDG) INJECTION
6.1400 | Freq: Once | INTRAVENOUS | Status: AC | PRN
  Administered 2023-07-01: 6.14 via INTRAVENOUS

## 2023-07-05 ENCOUNTER — Inpatient Hospital Stay

## 2023-07-05 ENCOUNTER — Other Ambulatory Visit: Payer: Self-pay

## 2023-07-05 ENCOUNTER — Inpatient Hospital Stay: Attending: Oncology

## 2023-07-05 DIAGNOSIS — C160 Malignant neoplasm of cardia: Secondary | ICD-10-CM | POA: Insufficient documentation

## 2023-07-05 DIAGNOSIS — R112 Nausea with vomiting, unspecified: Secondary | ICD-10-CM | POA: Insufficient documentation

## 2023-07-05 LAB — COMPREHENSIVE METABOLIC PANEL
ALT: 15 U/L (ref 0–44)
AST: 15 U/L (ref 15–41)
Albumin: 3.8 g/dL (ref 3.5–5.0)
Alkaline Phosphatase: 82 U/L (ref 38–126)
Anion gap: 14 (ref 5–15)
BUN: 9 mg/dL (ref 8–23)
CO2: 24 mmol/L (ref 22–32)
Calcium: 9.2 mg/dL (ref 8.9–10.3)
Chloride: 100 mmol/L (ref 98–111)
Creatinine, Ser: 0.72 mg/dL (ref 0.44–1.00)
GFR, Estimated: >60 mL/min (ref >60)
Glucose, Bld: 109 mg/dL — ABNORMAL HIGH (ref 70–99)
Potassium: 3.4 mmol/L — ABNORMAL LOW (ref 3.5–5.1)
Sodium: 138 mmol/L (ref 135–145)
Total Bilirubin: 0.7 mg/dL (ref 0.0–1.2)
Total Protein: 7 g/dL (ref 6.5–8.1)

## 2023-07-05 LAB — CBC WITH DIFFERENTIAL/PLATELET
Abs Immature Granulocytes: 0.01 10*3/uL (ref 0.00–0.07)
Basophils Absolute: 0 10*3/uL (ref 0.0–0.1)
Basophils Relative: 1 %
Eosinophils Absolute: 0 10*3/uL (ref 0.0–0.5)
Eosinophils Relative: 1 %
HCT: 35.5 % — ABNORMAL LOW (ref 36.0–46.0)
Hemoglobin: 11.7 g/dL — ABNORMAL LOW (ref 12.0–15.0)
Immature Granulocytes: 0 %
Lymphocytes Relative: 11 %
Lymphs Abs: 0.7 10*3/uL (ref 0.7–4.0)
MCH: 29 pg (ref 26.0–34.0)
MCHC: 33 g/dL (ref 30.0–36.0)
MCV: 87.9 fL (ref 80.0–100.0)
Monocytes Absolute: 0.4 10*3/uL (ref 0.1–1.0)
Monocytes Relative: 6 %
Neutro Abs: 5.3 10*3/uL (ref 1.7–7.7)
Neutrophils Relative %: 81 %
Platelets: 272 10*3/uL (ref 150–400)
RBC: 4.04 MIL/uL (ref 3.87–5.11)
RDW: 13.5 % (ref 11.5–15.5)
WBC: 6.5 10*3/uL (ref 4.0–10.5)
nRBC: 0 % (ref 0.0–0.2)

## 2023-07-05 LAB — MAGNESIUM: Magnesium: 2.1 mg/dL (ref 1.7–2.4)

## 2023-07-05 MED ORDER — MAGNESIUM SULFATE 2 GM/50ML IV SOLN
2.0000 g | Freq: Once | INTRAVENOUS | Status: AC
  Administered 2023-07-05: 2 g via INTRAVENOUS
  Filled 2023-07-05: qty 50

## 2023-07-05 MED ORDER — POTASSIUM CHLORIDE IN NACL 20-0.9 MEQ/L-% IV SOLN
Freq: Once | INTRAVENOUS | Status: AC
  Filled 2023-07-05: qty 1000

## 2023-07-05 NOTE — Patient Instructions (Signed)
 [facility] [place] - A DEPT OF [facility]  Discharge Instructions: Thank you for choosing [facility] to provide your oncology and hematology care.  If you have a lab appointment with the Cancer Center - please note that after April 8th, 2024, all labs will be drawn in the cancer center.  You do not have to check in or register with the main entrance as you have in the past but will complete your check-in in the cancer center.  Wear comfortable clothing and clothing appropriate for easy access to any Portacath or PICC line.   We strive to give you quality time with your provider. You may need to reschedule your appointment if you arrive late (15 or more minutes).  Arriving late affects you and other patients whose appointments are after yours.  Also, if you miss three or more appointments without notifying the office, you may be dismissed from the clinic at the provider's discretion.      For prescription refill requests, have your pharmacy contact our office and allow 72 hours for refills to be completed.    Today you received the following chemotherapy and/or immunotherapy agents house fluids    BELOW ARE SYMPTOMS THAT SHOULD BE REPORTED IMMEDIATELY: *FEVER GREATER THAN 100.4 F (38 C) OR HIGHER *CHILLS OR SWEATING *NAUSEA AND VOMITING THAT IS NOT CONTROLLED WITH YOUR NAUSEA MEDICATION *UNUSUAL SHORTNESS OF BREATH *UNUSUAL BRUISING OR BLEEDING *URINARY PROBLEMS (pain or burning when urinating, or frequent urination) *BOWEL PROBLEMS (unusual diarrhea, constipation, pain near the anus) TENDERNESS IN MOUTH AND THROAT WITH OR WITHOUT PRESENCE OF ULCERS (sore throat, sores in mouth, or a toothache) UNUSUAL RASH, SWELLING OR PAIN  UNUSUAL VAGINAL DISCHARGE OR ITCHING   Items with * indicate a potential emergency and should be followed up as soon as possible or go to the Emergency Department if any problems should occur.  Please show the CHEMOTHERAPY  ALERT CARD or IMMUNOTHERAPY ALERT CARD at check-in to the Emergency Department and triage nurse.  Should you have questions after your visit or need to cancel or reschedule your appointment, please contact [facility] [place] - A DEPT OF [facility] [phone]  and follow the prompts.  Office hours are 8:00 a.m. to 4:30 p.m. Monday - Friday. Please note that voicemails left after 4:00 p.m. may not be returned until the following business day.  We are closed weekends and major holidays. You have access to a nurse at all times for urgent questions. Please call the main number to the clinic [phone] and follow the prompts.  For any non-urgent questions, you may also contact your provider using MyChart. We now offer e-Visits for anyone 41 and older to request care online for non-urgent symptoms. For details visit [email or web address].   Also download the MyChart app! Go to the app store, search "MyChart", open the app, select [place], and log in with your MyChart username and password.

## 2023-07-05 NOTE — Progress Notes (Signed)
 Patient presents today for house fluids per provider's order. Vital signs stable and patient c/o weakness and vomiting for several days and not able to keep any food down. Dr.Kandala aware  Peripheral IV started with good blood return pre and post infusion.  Discharged from clinic via wheelchair in stable condition. Alert and oriented x 3. F/U with [facility] as scheduled.

## 2023-07-07 ENCOUNTER — Encounter (HOSPITAL_COMMUNITY): Payer: Self-pay | Admitting: Gastroenterology

## 2023-07-07 NOTE — Progress Notes (Addendum)
 Pre op call eval Name:  Doris Bender   PCP-Terry Daniel MD CardiologistGala Romney MD Pulmonologist-Mannam MD  CT- 06/15/23 EKG-06/16/23 Echo-12/28/22 Stress Test-n/a Cath-n/a Blood thinner-n/a GLP-1-n/a  Hx: Anemia, Rauynads, COPD, Emyphysema, Went to ED 2/11 for chest discomfort, they found her to have esophageal stricturing and probable mass near GE junction. Per pt EGD anesthesia while inpt went fine. Does see cardiology, last seen 11/09/22, due for yearly check in july 2025, no current cardiac issues. Also was seen by a pulmonologist last visit was a preop clearance for teeth surgery in 10/2022. Patient reports no breathing issues at this time.  Anesthesia Review: Yes- approved

## 2023-07-07 NOTE — Telephone Encounter (Signed)
 Procedure:*** Procedure date: *** Procedure location: *** Arrival Time: *** Spoke with the patient Y/N: n Any prep concerns? ***  Has the patient obtained the prep from the pharmacy ? *** Do you have a care partner and transportation: *** Any additional concerns? *** Left vm

## 2023-07-08 ENCOUNTER — Telehealth: Payer: Self-pay | Admitting: Dietician

## 2023-07-08 ENCOUNTER — Other Ambulatory Visit: Payer: Self-pay

## 2023-07-08 ENCOUNTER — Inpatient Hospital Stay: Payer: Commercial Managed Care - PPO | Admitting: Dietician

## 2023-07-08 DIAGNOSIS — Z170 Estrogen receptor positive status [ER+]: Secondary | ICD-10-CM

## 2023-07-08 DIAGNOSIS — C160 Malignant neoplasm of cardia: Secondary | ICD-10-CM

## 2023-07-08 MED ORDER — ONDANSETRON 8 MG PO TBDP: 8.0000 mg | ORAL_TABLET | Freq: Three times a day (TID) | ORAL | 0 refills | Status: DC | PRN

## 2023-07-08 NOTE — Telephone Encounter (Signed)
 Nutrition Follow-up:  Pt with newly diagnosed GE junction carcinoma. Treatment plan currently under work-up. Patient is under the care of Dr. Anders Simmonds.   3/7 - consult with Dr. Cliffton Asters  3/13 - EUS  Spoke with patient via telephone. She reports rough morning with nausea. Patient has started taking antiemetics with some relief. She has had one Boost today. This took 2 hours to consume. So far this has stayed down which she is glad for. Yesterday had half cup of beef broth, sprite, water, bites of [place] Ice, bites of applesauce, half cup peanut butter smoothie. Reports 2 episodes of vomiting a little bit after intake. She did try carafate, however stopped taking as it made her food taste nasty. Pt has not tried scopolamine patches as she is nervous about drying out her mouth more than it already is. She is tolerating sips of water. Pt reports increased weakness. She expresses concerns about minimal po, frequent vomiting and wt loss. She is agreeable to IVF support while awaiting treatment plan.   Medications: reviewed   Labs: 3/3 -  K 3.4, glucose 109  Anthropometrics: Reports 123 lb on home scale (without clothes) this morning  2/28 - 131 lb 6.4 oz   NUTRITION DIAGNOSIS: Swallowing difficulty continues    INTERVENTION:  Continue Boost as tolerated  Recommend taking antiemetics as prescribed, agreeable to zofran-odt per Dr. Anders Simmonds Recommend supportive therapy with IVF x3/week - discussed with MD, pt agreeable  Pending surgical consult with Dr. Cliffton Asters 3/7    MONITORING, EVALUATION, GOAL: wt trends, intake, treatment plan   NEXT VISIT: Brief f/u Monday 3/10 during IVF

## 2023-07-09 ENCOUNTER — Other Ambulatory Visit: Payer: Self-pay | Admitting: Oncology

## 2023-07-09 ENCOUNTER — Institutional Professional Consult (permissible substitution): Payer: Commercial Managed Care - PPO | Admitting: Thoracic Surgery (Cardiothoracic Vascular Surgery)

## 2023-07-09 VITALS — BP 110/75 | HR 98 | Resp 18 | Ht 65.0 in | Wt 126.0 lb

## 2023-07-09 DIAGNOSIS — C160 Malignant neoplasm of cardia: Secondary | ICD-10-CM

## 2023-07-09 NOTE — Progress Notes (Signed)
 [address]       [place] [address]             [phone]                    DAILYNN NANCARROW [facility] Medical Record #[id number] Date of Birth: 04-Apr-1961  Referring: Cindie Crumbly, MD Primary Care: Richardean Chimera, MD Primary Cardiologist: None  Chief Complaint:    Chief Complaint  Patient presents with   Esophageal Cancer    Review work up    History of Present Illness:    Doris Bender 63 y.o. female presents for surgical patient diagnosed GE junction carcinoma.  This was identified in February of this year.  She was admitted with nausea and vomiting, dysphagia, and a 15 pound weight loss.  She is only able to tolerate liquids at this point.  She has not started her neoadjuvant therapy.      Zubrod Score: At the time of surgery this patient's most appropriate activity status/level should be described as: []     0    Normal activity, no symptoms []     1    Restricted in physical strenuous activity but ambulatory, able to do out light work [x]     2    Ambulatory and capable of self care, unable to do work activities, up and about               >50 % of waking hours                              []     3    Only limited self care, in bed greater than 50% of waking hours []     4    Completely disabled, no self care, confined to bed or chair []     5    Moribund   Past Medical History:  Diagnosis Date   Arthritis    lt great toe (RA)   Breast cancer (HCC) 11/15/2012   left   Cancer (HCC)    breast   Compression fracture of L4 lumbar vertebra 11/04/2012   injury   Contact lens/glasses fitting    wears contacts or glasses   Gout 8-9 yrs ago   Rt heel/foot   History of radiation therapy 02/20/13-04/07/13   left breast 50.4 gray, upper outer quadrant boosted to 62.4 gray   Osteoporosis    Pseudogout of joint of left foot 6 yrs ago   Raynaud's disease    bilateral hands and feet   Scleroderma (HCC)    Wears partial dentures     partial lower    Past Surgical History:  Procedure Laterality Date   BIOPSY  06/16/2023   Procedure: BIOPSY;  Surgeon: Corbin Ade, MD;  [facility];  Service: Endoscopy;;   BREAST LUMPECTOMY WITH NEEDLE LOCALIZATION AND AXILLARY SENTINEL LYMPH NODE BX Left 12/21/2012   Procedure: BREAST LUMPECTOMY WITH NEEDLE LOCALIZATION AND AXILLARY SENTINEL LYMPH NODE BX;  Surgeon: Currie Paris, MD;  Location: [facility];  Service: General;  Laterality: Left;  needle local BCG     BREAST SURGERY  age 13   left breast bx- benign   CERVICAL CONIZATION W/BX  2012   benign   ELBOW SURGERY     ESOPHAGEAL BRUSHING  06/16/2023   Procedure: ESOPHAGEAL BRUSHING;  Surgeon: Corbin Ade, MD;  [facility];  Service: Endoscopy;;   ESOPHAGOGASTRODUODENOSCOPY (EGD) WITH PROPOFOL N/A 06/16/2023   Procedure: ESOPHAGOGASTRODUODENOSCOPY (EGD) WITH PROPOFOL;  Surgeon: Corbin Ade, MD;  [facility];  Service: Endoscopy;  Laterality: N/A;   KNEE ARTHROPLASTY     KNEE ARTHROSCOPY  2006   right   LATERAL EPICONDYLE RELEASE  03/08/2012   Procedure: TENNIS ELBOW RELEASE;  Surgeon: Wyn Forster., MD;  Location: [facility];  Service: Orthopedics;  Laterality: Right;  RECONSTRUCTION OF RIGHT LATERAL ELBOW WITH TENDONS   MASS EXCISION  03/08/2012   Procedure: EXCISION MASS;  Surgeon: Wyn Forster., MD;  Location: [facility];  Service: Orthopedics;  Laterality: Right;  EXCISION OF RIGHT WRIST DORSAL CYST   TENNIS ELBOW RELEASE/NIRSCHEL PROCEDURE Left 09/12/2015   Procedure: ARTHROTOMY LEFT ELBOW, REPAIR EXTENSOR ORIGIN;  Surgeon: Cindee Salt, MD;  Location: [facility];  Service: Orthopedics;  Laterality: Left;   TONSILLECTOMY     TONSILLECTOMY     63yrs old   WRIST GANGLION EXCISION     left x2    Family History  Problem Relation Age of Onset   Hyperthyroidism Mother    Cirrhosis Father    Diabetes Brother     Heart attack Maternal Grandmother    Heart attack Maternal Grandfather      Social History   Tobacco Use  Smoking Status Former   Current packs/day: 0.00   Average packs/day: 0.8 packs/day for 50.0 years (37.5 ttl pk-yrs)   Types: Cigarettes   Start date: 03/20/1972   Quit date: 03/20/2022   Years since quitting: 1.3   Passive exposure: Never  Smokeless Tobacco Never    Social History   Substance and Sexual Activity  Alcohol Use Yes   Comment: occ     Allergies  Allergen Reactions   Hydroxychloroquine Nausea And Vomiting and Other (See Comments)    Back, stomach/ abd pain.   Levofloxacin Other (See Comments)    Hallucinations, Dizziness.    Prednisone Nausea And Vomiting    Other reaction(s): Abdominal Pain   Doxycycline     dizzy   Ibuprofen Hives   Keflex [Cephalexin] Hives   Ketorolac Hives   Lodine [Etodolac] Hives   Methocarbamol Nausea Only   Oxycodone Nausea And Vomiting   Oxycodone Nausea Only   Robaxin [Methocarbamol] Hives    Current Outpatient Medications  Medication Sig Dispense Refill   acetaminophen (TYLENOL) 650 MG CR tablet Take 650 mg by mouth every 8 (eight) hours as needed for pain.     albuterol (VENTOLIN HFA) 108 (90 Base) MCG/ACT inhaler Inhale 1-2 puffs into the lungs every 6 (six) hours as needed for wheezing or shortness of breath.     ALPRAZolam (XANAX) 0.25 MG tablet Take 0.25 mg by mouth as needed for anxiety.     HYDROcodone-acetaminophen (NORCO/VICODIN) 5-325 MG tablet Take 1 tablet by mouth every 6 (six) hours as needed for moderate pain. (Patient taking differently: Take 0.5 tablets by mouth every 6 (six) hours as needed for moderate pain (pain score 4-6).) 20 tablet 0   lidocaine (XYLOCAINE) 2 % solution Use as directed 15 mLs in the mouth or throat as needed for mouth pain. 1500 mL 0   ondansetron (ZOFRAN) 4 MG tablet Take 1 tablet (4 mg total) by mouth daily as needed for nausea or vomiting. 30 tablet 1   ondansetron  (ZOFRAN-ODT) 8 MG disintegrating tablet Take 1 tablet (8 mg total) by mouth  every 8 (eight) hours as needed for nausea or vomiting. 90 tablet 0   scopolamine (TRANSDERM-SCOP) 1 MG/3DAYS Place 1 patch (1.5 mg total) onto the skin every 3 (three) days. 10 patch 12   sucralfate (CARAFATE) 1 GM/10ML suspension Take 10 mLs (1 g total) by mouth 4 (four) times daily -  with meals and at bedtime. 420 mL 0   No current facility-administered medications for this visit.    Review of Systems  Constitutional:  Positive for malaise/fatigue and weight loss.  Cardiovascular:  Positive for chest pain.  Gastrointestinal:  Positive for abdominal pain and nausea.  Neurological:  Positive for dizziness.     PHYSICAL EXAMINATION: BP 110/75 (BP Location: Right Arm)   Pulse 98   Resp 18   Ht 5\' 5"  (1.651 m)   Wt 126 lb (57.2 kg)   SpO2 95%   BMI 20.97 kg/m  Physical Exam Constitutional:      General: She is not in acute distress.    Appearance: She is ill-appearing.  HENT:     Head: Normocephalic and atraumatic.  Eyes:     Extraocular Movements: Extraocular movements intact.  Cardiovascular:     Rate and Rhythm: Normal rate.  Pulmonary:     Effort: Pulmonary effort is normal. No respiratory distress.  Abdominal:     General: Abdomen is flat. There is no distension.  Neurological:     General: No focal deficit present.     Mental Status: She is alert and oriented to person, place, and time.        I have independently reviewed the above radiology studies  and reviewed the findings with the patient.   Recent Lab Findings: Lab Results  Component Value Date   WBC 6.5 07/05/2023   HGB 11.7 (L) 07/05/2023   HCT 35.5 (L) 07/05/2023   PLT 272 07/05/2023   GLUCOSE 109 (H) 07/05/2023   ALT 15 07/05/2023   AST 15 07/05/2023   NA 138 07/05/2023   K 3.4 (L) 07/05/2023   CL 100 07/05/2023   CREATININE 0.72 07/05/2023   BUN 9 07/05/2023   CO2 24 07/05/2023   INR 1.1 06/16/2023        Assessment / Plan:   63 year old female with a GE junction carcinoma.  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  She is partially obstructed, but has been able to tolerate some liquids.  She was under the impression that she was only coming here to discuss the options of placing a feeding tube however she would like all of her surgical care and at [facility].  I explained to her that for esophagectomy a jejunostomy feeding tube would be required, as this can only be performed surgically.  I further explained that if she plans on having surgery at [facility], then this feeding tube likely should be performed by the operating surgeon as well.  She is able to tolerate some liquids at this I do not think it is an emergency to place a feeding tube at this point.  She is scheduled to see the [facility] surgical team next week.  She will follow-up with as needed.     I  spent 55 minutes with the patient face to face counseling and coordination of care.    Corliss Skains 07/09/2023 1:38 PM

## 2023-07-12 ENCOUNTER — Inpatient Hospital Stay

## 2023-07-12 ENCOUNTER — Inpatient Hospital Stay: Admitting: Dietician

## 2023-07-12 ENCOUNTER — Encounter: Payer: Self-pay | Admitting: Oncology

## 2023-07-12 VITALS — BP 95/67 | HR 81 | Temp 98.9°F | Resp 20 | Wt 124.5 lb

## 2023-07-12 DIAGNOSIS — C160 Malignant neoplasm of cardia: Secondary | ICD-10-CM

## 2023-07-12 DIAGNOSIS — E860 Dehydration: Secondary | ICD-10-CM

## 2023-07-12 DIAGNOSIS — C50412 Malignant neoplasm of upper-outer quadrant of left female breast: Secondary | ICD-10-CM

## 2023-07-12 LAB — CBC WITH DIFFERENTIAL/PLATELET
Abs Immature Granulocytes: 0.02 10*3/uL (ref 0.00–0.07)
Basophils Absolute: 0 10*3/uL (ref 0.0–0.1)
Basophils Relative: 1 %
Eosinophils Absolute: 0.1 10*3/uL (ref 0.0–0.5)
Eosinophils Relative: 2 %
HCT: 31.9 % — ABNORMAL LOW (ref 36.0–46.0)
Hemoglobin: 10.2 g/dL — ABNORMAL LOW (ref 12.0–15.0)
Immature Granulocytes: 0 %
Lymphocytes Relative: 14 %
Lymphs Abs: 0.9 10*3/uL (ref 0.7–4.0)
MCH: 28.1 pg (ref 26.0–34.0)
MCHC: 32 g/dL (ref 30.0–36.0)
MCV: 87.9 fL (ref 80.0–100.0)
Monocytes Absolute: 0.6 10*3/uL (ref 0.1–1.0)
Monocytes Relative: 9 %
Neutro Abs: 5 10*3/uL (ref 1.7–7.7)
Neutrophils Relative %: 74 %
Platelets: 311 10*3/uL (ref 150–400)
RBC: 3.63 MIL/uL — ABNORMAL LOW (ref 3.87–5.11)
RDW: 13.9 % (ref 11.5–15.5)
WBC: 6.6 10*3/uL (ref 4.0–10.5)
nRBC: 0 % (ref 0.0–0.2)

## 2023-07-12 LAB — COMPREHENSIVE METABOLIC PANEL
ALT: 14 U/L (ref 0–44)
AST: 14 U/L — ABNORMAL LOW (ref 15–41)
Albumin: 3.4 g/dL — ABNORMAL LOW (ref 3.5–5.0)
Alkaline Phosphatase: 69 U/L (ref 38–126)
Anion gap: 12 (ref 5–15)
BUN: 24 mg/dL — ABNORMAL HIGH (ref 8–23)
CO2: 28 mmol/L (ref 22–32)
Calcium: 9.2 mg/dL (ref 8.9–10.3)
Chloride: 98 mmol/L (ref 98–111)
Creatinine, Ser: 0.72 mg/dL (ref 0.44–1.00)
GFR, Estimated: >60 mL/min (ref >60)
Glucose, Bld: 115 mg/dL — ABNORMAL HIGH (ref 70–99)
Potassium: 3.7 mmol/L (ref 3.5–5.1)
Sodium: 138 mmol/L (ref 135–145)
Total Bilirubin: 0.7 mg/dL (ref 0.0–1.2)
Total Protein: 6.6 g/dL (ref 6.5–8.1)

## 2023-07-12 LAB — MAGNESIUM: Magnesium: 2.1 mg/dL (ref 1.7–2.4)

## 2023-07-12 MED ORDER — SODIUM CHLORIDE 0.9 % IV SOLN: Freq: Once | INTRAVENOUS | Status: AC

## 2023-07-12 NOTE — Patient Instructions (Signed)

## 2023-07-12 NOTE — Progress Notes (Signed)
 Patient presents today for iron infusion.  Patient is in satisfactory condition with no new complaints voiced.  Vital signs are stable.  IV placed in R wrist.  IV flushed well with good blood return noted.  We will proceed with infusion per provider orders.    Patient tolerated infusion well with no complaints voiced.  Patient left via wheelchair with mother in stable condition.  Vital signs stable at discharge.  Follow up as scheduled.

## 2023-07-12 NOTE — Progress Notes (Signed)
 Nutrition Follow-up:  Pt with newly diagnosed GE junction carcinoma. Treatment plan currently under work-up. Patient is under the care of Dr. Anders Simmonds.    3/7 - consult with Dr. Cliffton Asters  3/13 - EUS  Met with pt in infusion. She is receiving supportive therapy with IV fluids given poor tolerance to po with frequent episodes of vomiting. Pt met with Dr. Cliffton Asters who felt feeding tube could wait until seen by [facility] (3/25) for second opinion. Pt reports having a terrible night. She had consumed soup for dinner and after a couple sips of Pepsi, she began vomiting. Patient states she then became sweaty, dizzy and blacked out. Patient was sitting at this time. Her mother was able to hold her shirt to keep her from falling into the floor. Patient is no longer tolerating Boost. Tearful, pt acknowledges how serious this is becoming with worsening dysphagia and agreeable to feeding tube.    Medications: reviewed   Labs: glucose 115, BUN 24, albumin 3.4  Anthropometrics: Wt 124 lb 8 oz today decreased 6% in 10 days - this is severe for time frame  2/28 - 131 lb 6.4 oz   NUTRITION DIAGNOSIS: Swallowing difficulty ongoing    MALNUTRITION DIAGNOSIS: Pt is at high risk for malnutrition given worsening dysphagia and wt loss - tx plan under workup   INTERVENTION:  Continue supportive therapy with IVF x3/week Urgent referral to Dr. Lovell Sheehan for Jtube/PAC - consult 3/11 Provided sample packets of unjury protein powder Continue liquid diet as tolerated  Support and encouragement     MONITORING, EVALUATION, GOAL: wt trends, intake   NEXT VISIT: To be scheduled

## 2023-07-13 ENCOUNTER — Encounter: Payer: Self-pay | Admitting: Oncology

## 2023-07-13 ENCOUNTER — Ambulatory Visit (INDEPENDENT_AMBULATORY_CARE_PROVIDER_SITE_OTHER): Admitting: General Surgery

## 2023-07-13 ENCOUNTER — Encounter: Payer: Self-pay | Admitting: General Surgery

## 2023-07-13 VITALS — BP 102/69 | HR 80 | Temp 98.5°F | Resp 14 | Ht 65.0 in | Wt 122.0 lb

## 2023-07-13 DIAGNOSIS — C160 Malignant neoplasm of cardia: Secondary | ICD-10-CM | POA: Diagnosis not present

## 2023-07-13 NOTE — H&P (Signed)
 Doris Bender; [id number]; 1961/04/16   HPI Patient is a 63 year old white female who was referred to my care by Dr. Chauncey Fischer of oncology and Dr. 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  She is unable to maintain oral intake due to the GE junction carcinoma and needs both a Port-A-Cath placed as well as a jejunostomy tube placed.  She is being evaluated by cardiothoracic surgery for an esophagectomy.  She is going to [facility] for second opinion about her surgical options.  She has been losing weight recently.  She is unable to keep food down.  She is also starting to have problems with keeping liquids down.  She is receiving IV fluid hydration peripherally. Past Medical History:  Diagnosis Date   Arthritis    lt great toe (RA)   Breast cancer (HCC) 11/15/2012   left   Cancer (HCC)    breast   Compression fracture of L4 lumbar vertebra 11/04/2012   injury   Contact lens/glasses fitting    wears contacts or glasses   Gout 8-9 yrs ago   Rt heel/foot   History of radiation therapy 02/20/13-04/07/13   left breast 50.4 gray, upper outer quadrant boosted to 62.4 gray   Osteoporosis    Pseudogout of joint of left foot 6 yrs ago   Raynaud's disease    bilateral hands and feet   Scleroderma (HCC)    Wears partial dentures    partial lower    Past Surgical History:  Procedure Laterality Date   BIOPSY  06/16/2023   Procedure: BIOPSY;  Surgeon: Corbin Ade, MD;  [facility]: [facility];  Service: Endoscopy;;   BREAST LUMPECTOMY WITH NEEDLE LOCALIZATION AND AXILLARY SENTINEL LYMPH NODE BX Left 12/21/2012   Procedure: BREAST LUMPECTOMY WITH NEEDLE LOCALIZATION AND AXILLARY SENTINEL LYMPH NODE BX;  Surgeon: Currie Paris, MD;  Location: [facility];  Service: General;  Laterality: Left;  needle local BCG     BREAST SURGERY  age 15   left breast bx- benign   CERVICAL CONIZATION W/BX  2012   benign    ELBOW SURGERY     ESOPHAGEAL BRUSHING  06/16/2023   Procedure: ESOPHAGEAL BRUSHING;  Surgeon: Corbin Ade, MD;  [facility]: [facility];  Service: Endoscopy;;   ESOPHAGOGASTRODUODENOSCOPY (EGD) WITH PROPOFOL N/A 06/16/2023   Procedure: ESOPHAGOGASTRODUODENOSCOPY (EGD) WITH PROPOFOL;  Surgeon: Corbin Ade, MD;  [facility]: [facility];  Service: Endoscopy;  Laterality: N/A;   KNEE ARTHROPLASTY     KNEE ARTHROSCOPY  2006   right   LATERAL EPICONDYLE RELEASE  03/08/2012   Procedure: TENNIS ELBOW RELEASE;  Surgeon: Wyn Forster., MD;  Location: [facility];  Service: Orthopedics;  Laterality: Right;  RECONSTRUCTION OF RIGHT LATERAL ELBOW WITH TENDONS   MASS EXCISION  03/08/2012   Procedure: EXCISION MASS;  Surgeon: Wyn Forster., MD;  Location: [facility];  Service: Orthopedics;  Laterality: Right;  EXCISION OF RIGHT WRIST DORSAL CYST   TENNIS ELBOW RELEASE/NIRSCHEL PROCEDURE Left 09/12/2015   Procedure: ARTHROTOMY LEFT ELBOW, REPAIR EXTENSOR ORIGIN;  Surgeon: Cindee Salt, MD;  [facility];  Service: Orthopedics;  Laterality: Left;   TONSILLECTOMY     TONSILLECTOMY     63yrs old   WRIST GANGLION EXCISION     left x2    Family History  Problem Relation Age of Onset   Hyperthyroidism Mother    Cirrhosis Father  Diabetes Brother    Heart attack Maternal Grandmother    Heart attack Maternal Grandfather     Current Outpatient Medications on File Prior to Visit  Medication Sig Dispense Refill   acetaminophen (TYLENOL) 650 MG CR tablet Take 650 mg by mouth every 8 (eight) hours as needed for pain.     albuterol (VENTOLIN HFA) 108 (90 Base) MCG/ACT inhaler Inhale 1-2 puffs into the lungs every 6 (six) hours as needed for wheezing or shortness of breath.     ALPRAZolam (XANAX) 0.25 MG tablet Take 0.25 mg by mouth as needed for anxiety.     HYDROcodone-acetaminophen (NORCO/VICODIN) 5-325 MG tablet Take 1 tablet by mouth every 6  (six) hours as needed for moderate pain. (Patient taking differently: Take 0.5 tablets by mouth every 6 (six) hours as needed for moderate pain (pain score 4-6).) 20 tablet 0   lidocaine (XYLOCAINE) 2 % solution Use as directed 15 mLs in the mouth or throat as needed for mouth pain. 1500 mL 0   ondansetron (ZOFRAN) 4 MG tablet Take 1 tablet (4 mg total) by mouth daily as needed for nausea or vomiting. 30 tablet 1   ondansetron (ZOFRAN-ODT) 8 MG disintegrating tablet Take 1 tablet (8 mg total) by mouth every 8 (eight) hours as needed for nausea or vomiting. 90 tablet 0   scopolamine (TRANSDERM-SCOP) 1 MG/3DAYS Place 1 patch (1.5 mg total) onto the skin every 3 (three) days. 10 patch 12   sucralfate (CARAFATE) 1 GM/10ML suspension Take 10 mLs (1 g total) by mouth 4 (four) times daily -  with meals and at bedtime. 420 mL 0   No current facility-administered medications on file prior to visit.    Allergies  Allergen Reactions   Hydroxychloroquine Nausea And Vomiting and Other (See Comments)    Back, stomach/ abd pain.   Levofloxacin Other (See Comments)    Hallucinations, Dizziness.    Prednisone Nausea And Vomiting    Other reaction(s): Abdominal Pain   Doxycycline     dizzy   Ibuprofen Hives   Keflex [Cephalexin] Hives   Ketorolac Hives   Lodine [Etodolac] Hives   Methocarbamol Nausea Only   Oxycodone Nausea And Vomiting   Oxycodone Nausea Only   Robaxin [Methocarbamol] Hives    Social History   Substance and Sexual Activity  Alcohol Use Yes   Comment: occ    Social History   Tobacco Use  Smoking Status Former   Current packs/day: 0.00   Average packs/day: 0.8 packs/day for 50.0 years (37.5 ttl pk-yrs)   Types: Cigarettes   Start date: 03/20/1972   Quit date: 03/20/2022   Years since quitting: 1.3   Passive exposure: Never  Smokeless Tobacco Never    Review of Systems  Constitutional:  Positive for malaise/fatigue.  HENT: Negative.    Eyes: Negative.    Respiratory:  Positive for shortness of breath.   Cardiovascular: Negative.   Gastrointestinal: Negative.   Genitourinary: Negative.   Musculoskeletal:  Positive for back pain.  Skin: Negative.   Neurological:  Positive for dizziness.  Endo/Heme/Allergies: Negative.   Psychiatric/Behavioral: Negative.      Objective   Vitals:   07/13/23 1447  BP: 102/69  Pulse: 80  Resp: 14  Temp: 98.5 F (36.9 C)    Physical Exam Vitals reviewed.  Constitutional:      Appearance: Normal appearance. She is normal weight. She is not ill-appearing.  HENT:     Head: Normocephalic and atraumatic.  Cardiovascular:  Rate and Rhythm: Normal rate and regular rhythm.     Heart sounds: Normal heart sounds. No murmur heard.    No friction rub. No gallop.  Pulmonary:     Effort: Pulmonary effort is normal. No respiratory distress.     Breath sounds: Normal breath sounds. No stridor. No wheezing, rhonchi or rales.  Abdominal:     General: Bowel sounds are normal. There is no distension.     Palpations: Abdomen is soft. There is no mass.     Tenderness: There is no abdominal tenderness. There is no guarding or rebound.     Hernia: No hernia is present.  Skin:    General: Skin is warm and dry.  Neurological:     Mental Status: She is alert and oriented to person, place, and time.   Oncology notes reviewed  Assessment  Gastroesophageal juncture carcinoma, impending malnutrition and dehydration due to inability to maintain p.o. intake Plan  Patient is scheduled for a robotic assisted laparoscopic jejunostomy tube placement, Port-A-Cath placement on 07/21/2023.  The risks and benefits of both procedures including bleeding, infection, bowel injury, and pneumothorax were fully explained to the patient, who gave informed consent.

## 2023-07-13 NOTE — Progress Notes (Signed)
 Doris Bender; [id number]; 1961/04/16   HPI Patient is a 63 year old white female who was referred to my care by Dr. Chauncey Fischer of oncology and Dr. 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  She is unable to maintain oral intake due to the GE junction carcinoma and needs both a Port-A-Cath placed as well as a jejunostomy tube placed.  She is being evaluated by cardiothoracic surgery for an esophagectomy.  She is going to [facility] for second opinion about her surgical options.  She has been losing weight recently.  She is unable to keep food down.  She is also starting to have problems with keeping liquids down.  She is receiving IV fluid hydration peripherally. Past Medical History:  Diagnosis Date   Arthritis    lt great toe (RA)   Breast cancer (HCC) 11/15/2012   left   Cancer (HCC)    breast   Compression fracture of L4 lumbar vertebra 11/04/2012   injury   Contact lens/glasses fitting    wears contacts or glasses   Gout 8-9 yrs ago   Rt heel/foot   History of radiation therapy 02/20/13-04/07/13   left breast 50.4 gray, upper outer quadrant boosted to 62.4 gray   Osteoporosis    Pseudogout of joint of left foot 6 yrs ago   Raynaud's disease    bilateral hands and feet   Scleroderma (HCC)    Wears partial dentures    partial lower    Past Surgical History:  Procedure Laterality Date   BIOPSY  06/16/2023   Procedure: BIOPSY;  Surgeon: Corbin Ade, MD;  [facility]: [facility];  Service: Endoscopy;;   BREAST LUMPECTOMY WITH NEEDLE LOCALIZATION AND AXILLARY SENTINEL LYMPH NODE BX Left 12/21/2012   Procedure: BREAST LUMPECTOMY WITH NEEDLE LOCALIZATION AND AXILLARY SENTINEL LYMPH NODE BX;  Surgeon: Currie Paris, MD;  Location: [facility];  Service: General;  Laterality: Left;  needle local BCG     BREAST SURGERY  age 15   left breast bx- benign   CERVICAL CONIZATION W/BX  2012   benign    ELBOW SURGERY     ESOPHAGEAL BRUSHING  06/16/2023   Procedure: ESOPHAGEAL BRUSHING;  Surgeon: Corbin Ade, MD;  [facility]: [facility];  Service: Endoscopy;;   ESOPHAGOGASTRODUODENOSCOPY (EGD) WITH PROPOFOL N/A 06/16/2023   Procedure: ESOPHAGOGASTRODUODENOSCOPY (EGD) WITH PROPOFOL;  Surgeon: Corbin Ade, MD;  [facility]: [facility];  Service: Endoscopy;  Laterality: N/A;   KNEE ARTHROPLASTY     KNEE ARTHROSCOPY  2006   right   LATERAL EPICONDYLE RELEASE  03/08/2012   Procedure: TENNIS ELBOW RELEASE;  Surgeon: Wyn Forster., MD;  Location: [facility];  Service: Orthopedics;  Laterality: Right;  RECONSTRUCTION OF RIGHT LATERAL ELBOW WITH TENDONS   MASS EXCISION  03/08/2012   Procedure: EXCISION MASS;  Surgeon: Wyn Forster., MD;  Location: [facility];  Service: Orthopedics;  Laterality: Right;  EXCISION OF RIGHT WRIST DORSAL CYST   TENNIS ELBOW RELEASE/NIRSCHEL PROCEDURE Left 09/12/2015   Procedure: ARTHROTOMY LEFT ELBOW, REPAIR EXTENSOR ORIGIN;  Surgeon: Cindee Salt, MD;  [facility];  Service: Orthopedics;  Laterality: Left;   TONSILLECTOMY     TONSILLECTOMY     63yrs old   WRIST GANGLION EXCISION     left x2    Family History  Problem Relation Age of Onset   Hyperthyroidism Mother    Cirrhosis Father  Diabetes Brother    Heart attack Maternal Grandmother    Heart attack Maternal Grandfather     Current Outpatient Medications on File Prior to Visit  Medication Sig Dispense Refill   acetaminophen (TYLENOL) 650 MG CR tablet Take 650 mg by mouth every 8 (eight) hours as needed for pain.     albuterol (VENTOLIN HFA) 108 (90 Base) MCG/ACT inhaler Inhale 1-2 puffs into the lungs every 6 (six) hours as needed for wheezing or shortness of breath.     ALPRAZolam (XANAX) 0.25 MG tablet Take 0.25 mg by mouth as needed for anxiety.     HYDROcodone-acetaminophen (NORCO/VICODIN) 5-325 MG tablet Take 1 tablet by mouth every 6  (six) hours as needed for moderate pain. (Patient taking differently: Take 0.5 tablets by mouth every 6 (six) hours as needed for moderate pain (pain score 4-6).) 20 tablet 0   lidocaine (XYLOCAINE) 2 % solution Use as directed 15 mLs in the mouth or throat as needed for mouth pain. 1500 mL 0   ondansetron (ZOFRAN) 4 MG tablet Take 1 tablet (4 mg total) by mouth daily as needed for nausea or vomiting. 30 tablet 1   ondansetron (ZOFRAN-ODT) 8 MG disintegrating tablet Take 1 tablet (8 mg total) by mouth every 8 (eight) hours as needed for nausea or vomiting. 90 tablet 0   scopolamine (TRANSDERM-SCOP) 1 MG/3DAYS Place 1 patch (1.5 mg total) onto the skin every 3 (three) days. 10 patch 12   sucralfate (CARAFATE) 1 GM/10ML suspension Take 10 mLs (1 g total) by mouth 4 (four) times daily -  with meals and at bedtime. 420 mL 0   No current facility-administered medications on file prior to visit.    Allergies  Allergen Reactions   Hydroxychloroquine Nausea And Vomiting and Other (See Comments)    Back, stomach/ abd pain.   Levofloxacin Other (See Comments)    Hallucinations, Dizziness.    Prednisone Nausea And Vomiting    Other reaction(s): Abdominal Pain   Doxycycline     dizzy   Ibuprofen Hives   Keflex [Cephalexin] Hives   Ketorolac Hives   Lodine [Etodolac] Hives   Methocarbamol Nausea Only   Oxycodone Nausea And Vomiting   Oxycodone Nausea Only   Robaxin [Methocarbamol] Hives    Social History   Substance and Sexual Activity  Alcohol Use Yes   Comment: occ    Social History   Tobacco Use  Smoking Status Former   Current packs/day: 0.00   Average packs/day: 0.8 packs/day for 50.0 years (37.5 ttl pk-yrs)   Types: Cigarettes   Start date: 03/20/1972   Quit date: 03/20/2022   Years since quitting: 1.3   Passive exposure: Never  Smokeless Tobacco Never    Review of Systems  Constitutional:  Positive for malaise/fatigue.  HENT: Negative.    Eyes: Negative.    Respiratory:  Positive for shortness of breath.   Cardiovascular: Negative.   Gastrointestinal: Negative.   Genitourinary: Negative.   Musculoskeletal:  Positive for back pain.  Skin: Negative.   Neurological:  Positive for dizziness.  Endo/Heme/Allergies: Negative.   Psychiatric/Behavioral: Negative.      Objective   Vitals:   07/13/23 1447  BP: 102/69  Pulse: 80  Resp: 14  Temp: 98.5 F (36.9 C)    Physical Exam Vitals reviewed.  Constitutional:      Appearance: Normal appearance. She is normal weight. She is not ill-appearing.  HENT:     Head: Normocephalic and atraumatic.  Cardiovascular:  Rate and Rhythm: Normal rate and regular rhythm.     Heart sounds: Normal heart sounds. No murmur heard.    No friction rub. No gallop.  Pulmonary:     Effort: Pulmonary effort is normal. No respiratory distress.     Breath sounds: Normal breath sounds. No stridor. No wheezing, rhonchi or rales.  Abdominal:     General: Bowel sounds are normal. There is no distension.     Palpations: Abdomen is soft. There is no mass.     Tenderness: There is no abdominal tenderness. There is no guarding or rebound.     Hernia: No hernia is present.  Skin:    General: Skin is warm and dry.  Neurological:     Mental Status: She is alert and oriented to person, place, and time.   Oncology notes reviewed  Assessment  Gastroesophageal juncture carcinoma, impending malnutrition and dehydration due to inability to maintain p.o. intake Plan  Patient is scheduled for a robotic assisted laparoscopic jejunostomy tube placement, Port-A-Cath placement on 07/21/2023.  The risks and benefits of both procedures including bleeding, infection, bowel injury, and pneumothorax were fully explained to the patient, who gave informed consent.

## 2023-07-13 NOTE — H&P (View-Only) (Signed)
 Doris Bender; [id number]; 1961/04/16   HPI Patient is a 63 year old white female who was referred to my care by Dr. Chauncey Fischer of oncology and Dr. 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  She is unable to maintain oral intake due to the GE junction carcinoma and needs both a Port-A-Cath placed as well as a jejunostomy tube placed.  She is being evaluated by cardiothoracic surgery for an esophagectomy.  She is going to [facility] for second opinion about her surgical options.  She has been losing weight recently.  She is unable to keep food down.  She is also starting to have problems with keeping liquids down.  She is receiving IV fluid hydration peripherally. Past Medical History:  Diagnosis Date   Arthritis    lt great toe (RA)   Breast cancer (HCC) 11/15/2012   left   Cancer (HCC)    breast   Compression fracture of L4 lumbar vertebra 11/04/2012   injury   Contact lens/glasses fitting    wears contacts or glasses   Gout 8-9 yrs ago   Rt heel/foot   History of radiation therapy 02/20/13-04/07/13   left breast 50.4 gray, upper outer quadrant boosted to 62.4 gray   Osteoporosis    Pseudogout of joint of left foot 6 yrs ago   Raynaud's disease    bilateral hands and feet   Scleroderma (HCC)    Wears partial dentures    partial lower    Past Surgical History:  Procedure Laterality Date   BIOPSY  06/16/2023   Procedure: BIOPSY;  Surgeon: Corbin Ade, MD;  [facility];  Service: Endoscopy;;   BREAST LUMPECTOMY WITH NEEDLE LOCALIZATION AND AXILLARY SENTINEL LYMPH NODE BX Left 12/21/2012   Procedure: BREAST LUMPECTOMY WITH NEEDLE LOCALIZATION AND AXILLARY SENTINEL LYMPH NODE BX;  Surgeon: Currie Paris, MD;  Location: [facility];  Service: General;  Laterality: Left;  needle local BCG     BREAST SURGERY  age 15   left breast bx- benign   CERVICAL CONIZATION W/BX  2012   benign    ELBOW SURGERY     ESOPHAGEAL BRUSHING  06/16/2023   Procedure: ESOPHAGEAL BRUSHING;  Surgeon: Corbin Ade, MD;  [facility];  Service: Endoscopy;;   ESOPHAGOGASTRODUODENOSCOPY (EGD) WITH PROPOFOL N/A 06/16/2023   Procedure: ESOPHAGOGASTRODUODENOSCOPY (EGD) WITH PROPOFOL;  Surgeon: Corbin Ade, MD;  [facility];  Service: Endoscopy;  Laterality: N/A;   KNEE ARTHROPLASTY     KNEE ARTHROSCOPY  2006   right   LATERAL EPICONDYLE RELEASE  03/08/2012   Procedure: TENNIS ELBOW RELEASE;  Surgeon: Wyn Forster., MD;  Location: [facility];  Service: Orthopedics;  Laterality: Right;  RECONSTRUCTION OF RIGHT LATERAL ELBOW WITH TENDONS   MASS EXCISION  03/08/2012   Procedure: EXCISION MASS;  Surgeon: Wyn Forster., MD;  Location: [facility];  Service: Orthopedics;  Laterality: Right;  EXCISION OF RIGHT WRIST DORSAL CYST   TENNIS ELBOW RELEASE/NIRSCHEL PROCEDURE Left 09/12/2015   Procedure: ARTHROTOMY LEFT ELBOW, REPAIR EXTENSOR ORIGIN;  Surgeon: Cindee Salt, MD;  Location:  [facility];  Service: Orthopedics;  Laterality: Left;   TONSILLECTOMY     TONSILLECTOMY     63yrs old   WRIST GANGLION EXCISION     left x2    Family History  Problem Relation Age of Onset   Hyperthyroidism Mother    Cirrhosis Father  Diabetes Brother    Heart attack Maternal Grandmother    Heart attack Maternal Grandfather     Current Outpatient Medications on File Prior to Visit  Medication Sig Dispense Refill   acetaminophen (TYLENOL) 650 MG CR tablet Take 650 mg by mouth every 8 (eight) hours as needed for pain.     albuterol (VENTOLIN HFA) 108 (90 Base) MCG/ACT inhaler Inhale 1-2 puffs into the lungs every 6 (six) hours as needed for wheezing or shortness of breath.     ALPRAZolam (XANAX) 0.25 MG tablet Take 0.25 mg by mouth as needed for anxiety.     HYDROcodone-acetaminophen (NORCO/VICODIN) 5-325 MG tablet Take 1 tablet by mouth every 6  (six) hours as needed for moderate pain. (Patient taking differently: Take 0.5 tablets by mouth every 6 (six) hours as needed for moderate pain (pain score 4-6).) 20 tablet 0   lidocaine (XYLOCAINE) 2 % solution Use as directed 15 mLs in the mouth or throat as needed for mouth pain. 1500 mL 0   ondansetron (ZOFRAN) 4 MG tablet Take 1 tablet (4 mg total) by mouth daily as needed for nausea or vomiting. 30 tablet 1   ondansetron (ZOFRAN-ODT) 8 MG disintegrating tablet Take 1 tablet (8 mg total) by mouth every 8 (eight) hours as needed for nausea or vomiting. 90 tablet 0   scopolamine (TRANSDERM-SCOP) 1 MG/3DAYS Place 1 patch (1.5 mg total) onto the skin every 3 (three) days. 10 patch 12   sucralfate (CARAFATE) 1 GM/10ML suspension Take 10 mLs (1 g total) by mouth 4 (four) times daily -  with meals and at bedtime. 420 mL 0   No current facility-administered medications on file prior to visit.    Allergies  Allergen Reactions   Hydroxychloroquine Nausea And Vomiting and Other (See Comments)    Back, stomach/ abd pain.   Levofloxacin Other (See Comments)    Hallucinations, Dizziness.    Prednisone Nausea And Vomiting    Other reaction(s): Abdominal Pain   Doxycycline     dizzy   Ibuprofen Hives   Keflex [Cephalexin] Hives   Ketorolac Hives   Lodine [Etodolac] Hives   Methocarbamol Nausea Only   Oxycodone Nausea And Vomiting   Oxycodone Nausea Only   Robaxin [Methocarbamol] Hives    Social History   Substance and Sexual Activity  Alcohol Use Yes   Comment: occ    Social History   Tobacco Use  Smoking Status Former   Current packs/day: 0.00   Average packs/day: 0.8 packs/day for 50.0 years (37.5 ttl pk-yrs)   Types: Cigarettes   Start date: 03/20/1972   Quit date: 03/20/2022   Years since quitting: 1.3   Passive exposure: Never  Smokeless Tobacco Never    Review of Systems  Constitutional:  Positive for malaise/fatigue.  HENT: Negative.    Eyes: Negative.    Respiratory:  Positive for shortness of breath.   Cardiovascular: Negative.   Gastrointestinal: Negative.   Genitourinary: Negative.   Musculoskeletal:  Positive for back pain.  Skin: Negative.   Neurological:  Positive for dizziness.  Endo/Heme/Allergies: Negative.   Psychiatric/Behavioral: Negative.      Objective   Vitals:   07/13/23 1447  BP: 102/69  Pulse: 80  Resp: 14  Temp: 98.5 F (36.9 C)    Physical Exam Vitals reviewed.  Constitutional:      Appearance: Normal appearance. She is normal weight. She is not ill-appearing.  HENT:     Head: Normocephalic and atraumatic.  Cardiovascular:  Rate and Rhythm: Normal rate and regular rhythm.     Heart sounds: Normal heart sounds. No murmur heard.    No friction rub. No gallop.  Pulmonary:     Effort: Pulmonary effort is normal. No respiratory distress.     Breath sounds: Normal breath sounds. No stridor. No wheezing, rhonchi or rales.  Abdominal:     General: Bowel sounds are normal. There is no distension.     Palpations: Abdomen is soft. There is no mass.     Tenderness: There is no abdominal tenderness. There is no guarding or rebound.     Hernia: No hernia is present.  Skin:    General: Skin is warm and dry.  Neurological:     Mental Status: She is alert and oriented to person, place, and time.   Oncology notes reviewed  Assessment  Gastroesophageal juncture carcinoma, impending malnutrition and dehydration due to inability to maintain p.o. intake Plan  Patient is scheduled for a robotic assisted laparoscopic jejunostomy tube placement, Port-A-Cath placement on 07/21/2023.  The risks and benefits of both procedures including bleeding, infection, bowel injury, and pneumothorax were fully explained to the patient, who gave informed consent.

## 2023-07-14 ENCOUNTER — Inpatient Hospital Stay

## 2023-07-14 VITALS — BP 135/76 | HR 85 | Temp 98.7°F | Resp 18

## 2023-07-14 DIAGNOSIS — C160 Malignant neoplasm of cardia: Secondary | ICD-10-CM

## 2023-07-14 DIAGNOSIS — Z170 Estrogen receptor positive status [ER+]: Secondary | ICD-10-CM

## 2023-07-14 DIAGNOSIS — E876 Hypokalemia: Secondary | ICD-10-CM

## 2023-07-14 LAB — COMPREHENSIVE METABOLIC PANEL
ALT: 15 U/L (ref 0–44)
AST: 16 U/L (ref 15–41)
Albumin: 3.5 g/dL (ref 3.5–5.0)
Alkaline Phosphatase: 73 U/L (ref 38–126)
Anion gap: 13 (ref 5–15)
BUN: 12 mg/dL (ref 8–23)
CO2: 25 mmol/L (ref 22–32)
Calcium: 8.8 mg/dL — ABNORMAL LOW (ref 8.9–10.3)
Chloride: 98 mmol/L (ref 98–111)
Creatinine, Ser: 0.63 mg/dL (ref 0.44–1.00)
GFR, Estimated: >60 mL/min (ref >60)
Glucose, Bld: 141 mg/dL — ABNORMAL HIGH (ref 70–99)
Potassium: 3.2 mmol/L — ABNORMAL LOW (ref 3.5–5.1)
Sodium: 136 mmol/L (ref 135–145)
Total Bilirubin: 1.1 mg/dL (ref 0.0–1.2)
Total Protein: 6.7 g/dL (ref 6.5–8.1)

## 2023-07-14 LAB — CBC WITH DIFFERENTIAL/PLATELET
Abs Immature Granulocytes: 0.04 10*3/uL (ref 0.00–0.07)
Basophils Absolute: 0.1 10*3/uL (ref 0.0–0.1)
Basophils Relative: 1 %
Eosinophils Absolute: 0.1 10*3/uL (ref 0.0–0.5)
Eosinophils Relative: 2 %
HCT: 30.4 % — ABNORMAL LOW (ref 36.0–46.0)
Hemoglobin: 9.6 g/dL — ABNORMAL LOW (ref 12.0–15.0)
Immature Granulocytes: 1 %
Lymphocytes Relative: 10 %
Lymphs Abs: 0.7 10*3/uL (ref 0.7–4.0)
MCH: 28 pg (ref 26.0–34.0)
MCHC: 31.6 g/dL (ref 30.0–36.0)
MCV: 88.6 fL (ref 80.0–100.0)
Monocytes Absolute: 0.4 10*3/uL (ref 0.1–1.0)
Monocytes Relative: 5 %
Neutro Abs: 5.8 10*3/uL (ref 1.7–7.7)
Neutrophils Relative %: 81 %
Platelets: 289 10*3/uL (ref 150–400)
RBC: 3.43 MIL/uL — ABNORMAL LOW (ref 3.87–5.11)
RDW: 14.2 % (ref 11.5–15.5)
WBC: 7.2 10*3/uL (ref 4.0–10.5)
nRBC: 0 % (ref 0.0–0.2)

## 2023-07-14 LAB — MAGNESIUM: Magnesium: 1.9 mg/dL (ref 1.7–2.4)

## 2023-07-14 MED ORDER — POTASSIUM CHLORIDE IN NACL 20-0.9 MEQ/L-% IV SOLN
Freq: Once | INTRAVENOUS | Status: AC
  Filled 2023-07-14: qty 1000

## 2023-07-14 MED ORDER — MAGNESIUM SULFATE 2 GM/50ML IV SOLN
2.0000 g | Freq: Once | INTRAVENOUS | Status: AC
  Administered 2023-07-14: 2 g via INTRAVENOUS
  Filled 2023-07-14: qty 50

## 2023-07-14 MED ORDER — POTASSIUM CHLORIDE IN NACL 40-0.9 MEQ/L-% IV SOLN
Freq: Once | INTRAVENOUS | Status: DC
  Filled 2023-07-14: qty 1000

## 2023-07-14 NOTE — Addendum Note (Signed)
 Addended by: Phillips Odor on: 07/14/2023 12:07 PM   Modules accepted: Orders

## 2023-07-14 NOTE — Progress Notes (Signed)
 Tolerated infusion without adverse affects.  Vital signs stable.  No complaints at this time.  Discharge from clinic via wheelchair in stable condition.  Alert and oriented X 3.  Follow up with [facility] as scheduled.

## 2023-07-14 NOTE — Patient Instructions (Signed)
 [facility] - A DEPT OF [facility]  Discharge Instructions: Thank you for choosing [facility] to provide your oncology and hematology care.  If you have a lab appointment with the Cancer Center - please note that after April 8th, 2024, all labs will be drawn in the cancer center.  You do not have to check in or register with the main entrance as you have in the past but will complete your check-in in the cancer center.  Wear comfortable clothing and clothing appropriate for easy access to any Portacath or PICC line.   We strive to give you quality time with your provider. You may need to reschedule your appointment if you arrive late (15 or more minutes).  Arriving late affects you and other patients whose appointments are after yours.  Also, if you miss three or more appointments without notifying the office, you may be dismissed from the clinic at the provider's discretion.      For prescription refill requests, have your pharmacy contact our office and allow 72 hours for refills to be completed.    Today you received the following chemotherapy and/or immunotherapy agents fluids      To help prevent nausea and vomiting after your treatment, we encourage you to take your nausea medication as directed.  BELOW ARE SYMPTOMS THAT SHOULD BE REPORTED IMMEDIATELY: *FEVER GREATER THAN 100.4 F (38 C) OR HIGHER *CHILLS OR SWEATING *NAUSEA AND VOMITING THAT IS NOT CONTROLLED WITH YOUR NAUSEA MEDICATION *UNUSUAL SHORTNESS OF BREATH *UNUSUAL BRUISING OR BLEEDING *URINARY PROBLEMS (pain or burning when urinating, or frequent urination) *BOWEL PROBLEMS (unusual diarrhea, constipation, pain near the anus) TENDERNESS IN MOUTH AND THROAT WITH OR WITHOUT PRESENCE OF ULCERS (sore throat, sores in mouth, or a toothache) UNUSUAL RASH, SWELLING OR PAIN  UNUSUAL VAGINAL DISCHARGE OR ITCHING   Items with * indicate a potential emergency and should be followed up as  soon as possible or go to the Emergency Department if any problems should occur.  Please show the CHEMOTHERAPY ALERT CARD or IMMUNOTHERAPY ALERT CARD at check-in to the Emergency Department and triage nurse.  Should you have questions after your visit or need to cancel or reschedule your appointment, please contact [facility]  - A DEPT OF [facility] [phone]  and follow the prompts.  Office hours are 8:00 a.m. to 4:30 p.m. Monday - Friday. Please note that voicemails left after 4:00 p.m. may not be returned until the following business day.  We are closed weekends and major holidays. You have access to a nurse at all times for urgent questions. Please call the main number to the clinic [phone] and follow the prompts.  For any non-urgent questions, you may also contact your provider using MyChart. We now offer e-Visits for anyone 74 and older to request care online for non-urgent symptoms. For details visit [email or web address].   Also download the MyChart app! Go to the app store, search "MyChart", open the app, select [place], and log in with your MyChart username and password.

## 2023-07-14 NOTE — Progress Notes (Signed)
 Patient presents today for IVF.  Patient is in satisfactory condition with no new complaints voiced.  Vital signs are stable.  We will proceed with infusion per provider orders.

## 2023-07-14 NOTE — Progress Notes (Signed)
 Patient presents today for IVF.  Patient is in stable condition with no new complaints voiced.  Vital signs are stable.  Potassium today is 3.2 and magnesium is 3.2.  We will give house IVF today per Dr. Anders Simmonds.  IV placed in R wrist. IV flushed well with good blood return noted.  We will proceed with IVF per MD orders.

## 2023-07-14 NOTE — Anesthesia Preprocedure Evaluation (Signed)
 Anesthesia Evaluation  Patient identified by MRN, date of birth, ID band Patient awake    Reviewed: Allergy & Precautions, NPO status , Patient's Chart, lab work & pertinent test results  Airway Mallampati: II  TM Distance: >3 FB Neck ROM: Full    Dental no notable dental hx. (+) Edentulous Upper, Edentulous Lower   Pulmonary COPD, former smoker   Pulmonary exam normal        Cardiovascular negative cardio ROS  Rhythm:Regular Rate:Normal     Neuro/Psych negative neurological ROS  negative psych ROS   GI/Hepatic Neg liver ROS,,,GEJ tumor    Endo/Other  negative endocrine ROS    Renal/GU negative Renal ROS  negative genitourinary   Musculoskeletal  (+) Arthritis , Osteoarthritis,    Abdominal Normal abdominal exam  (+)   Peds  Hematology negative hematology ROS (+)   Anesthesia Other Findings   Reproductive/Obstetrics                             Anesthesia Physical Anesthesia Plan  ASA: 3  Anesthesia Plan: MAC   Post-op Pain Management:    Induction:   PONV Risk Score and Plan: 2 and Propofol infusion and Treatment may vary due to age or medical condition  Airway Management Planned: Simple Face Mask and Nasal Cannula  Additional Equipment: None  Intra-op Plan:   Post-operative Plan:   Informed Consent: I have reviewed the patients History and Physical, chart, labs and discussed the procedure including the risks, benefits and alternatives for the proposed anesthesia with the patient or authorized representative who has indicated his/her understanding and acceptance.     Dental advisory given  Plan Discussed with: CRNA  Anesthesia Plan Comments:        Anesthesia Quick Evaluation

## 2023-07-15 ENCOUNTER — Inpatient Hospital Stay: Payer: Commercial Managed Care - PPO | Admitting: Oncology

## 2023-07-15 ENCOUNTER — Other Ambulatory Visit: Payer: Self-pay

## 2023-07-15 ENCOUNTER — Encounter (HOSPITAL_COMMUNITY)
Admission: RE | Disposition: A | Discharge status: Home / Self Care | Payer: Self-pay | Source: Home / Self Care | Attending: Gastroenterology

## 2023-07-15 ENCOUNTER — Ambulatory Visit (HOSPITAL_COMMUNITY): Admitting: Anesthesiology

## 2023-07-15 ENCOUNTER — Ambulatory Visit (HOSPITAL_COMMUNITY)
Admission: RE | Admit: 2023-07-15 | Discharge: 2023-07-15 | Disposition: A | Discharge status: Home / Self Care | Payer: Commercial Managed Care - PPO | Attending: Gastroenterology | Admitting: Gastroenterology

## 2023-07-15 ENCOUNTER — Encounter (HOSPITAL_COMMUNITY): Payer: Self-pay | Admitting: Gastroenterology

## 2023-07-15 ENCOUNTER — Ambulatory Visit (HOSPITAL_BASED_OUTPATIENT_CLINIC_OR_DEPARTMENT_OTHER): Admitting: Anesthesiology

## 2023-07-15 DIAGNOSIS — K2289 Other specified disease of esophagus: Secondary | ICD-10-CM

## 2023-07-15 DIAGNOSIS — C159 Malignant neoplasm of esophagus, unspecified: Secondary | ICD-10-CM | POA: Diagnosis not present

## 2023-07-15 DIAGNOSIS — I899 Noninfective disorder of lymphatic vessels and lymph nodes, unspecified: Secondary | ICD-10-CM

## 2023-07-15 DIAGNOSIS — K3189 Other diseases of stomach and duodenum: Secondary | ICD-10-CM | POA: Insufficient documentation

## 2023-07-15 DIAGNOSIS — J449 Chronic obstructive pulmonary disease, unspecified: Secondary | ICD-10-CM | POA: Insufficient documentation

## 2023-07-15 DIAGNOSIS — C160 Malignant neoplasm of cardia: Secondary | ICD-10-CM

## 2023-07-15 DIAGNOSIS — K222 Esophageal obstruction: Secondary | ICD-10-CM

## 2023-07-15 DIAGNOSIS — Z87891 Personal history of nicotine dependence: Secondary | ICD-10-CM | POA: Insufficient documentation

## 2023-07-15 DIAGNOSIS — K2960 Other gastritis without bleeding: Secondary | ICD-10-CM

## 2023-07-15 HISTORY — PX: EUS: SHX5427

## 2023-07-15 SURGERY — UPPER ENDOSCOPIC ULTRASOUND (EUS) RADIAL
Anesthesia: Monitor Anesthesia Care

## 2023-07-15 MED ORDER — SODIUM CHLORIDE 0.9 % IV SOLN: INTRAVENOUS | Status: DC

## 2023-07-15 MED ORDER — PHENYLEPHRINE 80 MCG/ML (10ML) SYRINGE FOR IV PUSH (FOR BLOOD PRESSURE SUPPORT)
PREFILLED_SYRINGE | INTRAVENOUS | Status: DC | PRN
  Administered 2023-07-15: 120 ug via INTRAVENOUS

## 2023-07-15 MED ORDER — LIDOCAINE 2% (20 MG/ML) 5 ML SYRINGE
INTRAMUSCULAR | Status: DC | PRN
  Administered 2023-07-15: 100 mg via INTRAVENOUS

## 2023-07-15 MED ORDER — OMEPRAZOLE 2 MG/ML ORAL SUSPENSION: 40.0000 mg | Freq: Every day | ORAL | 6 refills | Status: DC

## 2023-07-15 MED ORDER — PROPOFOL 1000 MG/100ML IV EMUL
INTRAVENOUS | Status: AC
  Filled 2023-07-15: qty 100

## 2023-07-15 MED ORDER — PROPOFOL 500 MG/50ML IV EMUL
INTRAVENOUS | Status: DC | PRN
  Administered 2023-07-15: 150 ug/kg/min via INTRAVENOUS

## 2023-07-15 NOTE — Discharge Instructions (Signed)

## 2023-07-15 NOTE — H&P (Signed)
 GASTROENTEROLOGY PROCEDURE H&P NOTE   Primary Care Physician: Richardean Chimera, MD  HPI: Doris Bender is a 63 y.o. female who presents for EGD/EUS for esophageal cancer staging (pretreatment pending evaluation at [facility] for potential treatment).  Past Medical History:  Diagnosis Date   Arthritis    lt great toe (RA)   Breast cancer (HCC) 11/15/2012   left   Cancer (HCC)    breast   Compression fracture of L4 lumbar vertebra 11/04/2012   injury   Contact lens/glasses fitting    wears contacts or glasses   Gout 8-9 yrs ago   Rt heel/foot   History of radiation therapy 02/20/13-04/07/13   left breast 50.4 gray, upper outer quadrant boosted to 62.4 gray   Osteoporosis    Pseudogout of joint of left foot 6 yrs ago   Raynaud's disease    bilateral hands and feet   Scleroderma (HCC)    Wears partial dentures    partial lower   Past Surgical History:  Procedure Laterality Date   BIOPSY  06/16/2023   Procedure: BIOPSY;  Surgeon: Corbin Ade, MD;  [facility];  Service: Endoscopy;;   BREAST LUMPECTOMY WITH NEEDLE LOCALIZATION AND AXILLARY SENTINEL LYMPH NODE BX Left 12/21/2012   Procedure: BREAST LUMPECTOMY WITH NEEDLE LOCALIZATION AND AXILLARY SENTINEL LYMPH NODE BX;  Surgeon: Currie Paris, MD;  Location: [facility];  Service: General;  Laterality: Left;  needle local BCG     BREAST SURGERY  age 49   left breast bx- benign   CERVICAL CONIZATION W/BX  2012   benign   ELBOW SURGERY     ESOPHAGEAL BRUSHING  06/16/2023   Procedure: ESOPHAGEAL BRUSHING;  Surgeon: Corbin Ade, MD;  [facility];  Service: Endoscopy;;   ESOPHAGOGASTRODUODENOSCOPY (EGD) WITH PROPOFOL N/A 06/16/2023   Procedure: ESOPHAGOGASTRODUODENOSCOPY (EGD) WITH PROPOFOL;  Surgeon: Corbin Ade, MD;  [facility];  Service: Endoscopy;  Laterality: N/A;   KNEE ARTHROPLASTY     KNEE ARTHROSCOPY  2006   right   LATERAL EPICONDYLE RELEASE  03/08/2012    Procedure: TENNIS ELBOW RELEASE;  Surgeon: Wyn Forster., MD;  Location: [facility];  Service: Orthopedics;  Laterality: Right;  RECONSTRUCTION OF RIGHT LATERAL ELBOW WITH TENDONS   MASS EXCISION  03/08/2012   Procedure: EXCISION MASS;  Surgeon: Wyn Forster., MD;  Location: [facility];  Service: Orthopedics;  Laterality: Right;  EXCISION OF RIGHT WRIST DORSAL CYST   TENNIS ELBOW RELEASE/NIRSCHEL PROCEDURE Left 09/12/2015   Procedure: ARTHROTOMY LEFT ELBOW, REPAIR EXTENSOR ORIGIN;  Surgeon: Cindee Salt, MD;  Location: [facility];  Service: Orthopedics;  Laterality: Left;   TONSILLECTOMY     TONSILLECTOMY     63yrs old   WRIST GANGLION EXCISION     left x2   No current facility-administered medications for this encounter.   No current facility-administered medications for this encounter. Allergies  Allergen Reactions   Hydroxychloroquine Nausea And Vomiting and Other (See Comments)    Back, stomach/ abd pain.   Levofloxacin Other (See Comments)    Hallucinations, Dizziness.    Prednisone Nausea And Vomiting    Other reaction(s): Abdominal Pain   Doxycycline     dizzy   Ibuprofen Hives   Keflex [Cephalexin] Hives   Ketorolac Hives   Lodine [Etodolac] Hives   Methocarbamol Nausea Only   Oxycodone Nausea And Vomiting   Oxycodone Nausea Only  Robaxin [Methocarbamol] Hives   Family History  Problem Relation Age of Onset   Hyperthyroidism Mother    Cirrhosis Father    Diabetes Brother    Heart attack Maternal Grandmother    Heart attack Maternal Grandfather    Social History   Socioeconomic History   Marital status: Divorced    Spouse name: Not on file   Number of children: Not on file   Years of education: Not on file   Highest education level: Not on file  Occupational History   Not on file  Tobacco Use   Smoking status: Former    Current packs/day: 0.00    Average packs/day: 0.8 packs/day for 50.0 years (37.5  ttl pk-yrs)    Types: Cigarettes    Start date: 03/20/1972    Quit date: 03/20/2022    Years since quitting: 1.3    Passive exposure: Never   Smokeless tobacco: Never  Vaping Use   Vaping status: Never Used  Substance and Sexual Activity   Alcohol use: Yes    Comment: occ   Drug use: No   Sexual activity: Not on file    Comment: menarche age 89, miscarriage x 1, P1,  age 20 w/birth of son, Depo-Provera x 20 yrs  Other Topics Concern   Not on file  Social History Narrative   ** Merged History Encounter **       Social Drivers of Health   Financial Resource Strain: Not on file  Food Insecurity: No Food Insecurity (06/16/2023)   Hunger Vital Sign    Worried About Running Out of Food in the Last Year: Never true    Ran Out of Food in the Last Year: Never true  Transportation Needs: No Transportation Needs (06/16/2023)   PRAPARE - Administrator, Civil Service (Medical): No    Lack of Transportation (Non-Medical): No  Physical Activity: Insufficiently Active (04/12/2023)   Received from [facility]   Exercise Vital Sign    Days of Exercise per Week: 1 day    Minutes of Exercise per Session: 60 min  Stress: No Stress Concern Present (11/26/2020)   [facility], [facility] - Occupational Stress Questionnaire    Feeling of Stress : Only a little  Social Connections: Unknown (09/14/2021)   Received from [facility], [facility]   Social Network    Social Network: Not on file  Intimate Partner Violence: Not At Risk (06/16/2023)   Humiliation, Afraid, Rape, and Kick questionnaire    Fear of Current or Ex-Partner: No    Emotionally Abused: No    Physically Abused: No    Sexually Abused: No    Physical Exam: Today's Vitals   07/15/23 0840  BP: (!) 144/77  Pulse: 86  Resp: 17  Temp: (!) 97.5 F (36.4 C)  TempSrc: Temporal  SpO2: 93%  Weight: 55.3 kg  Height: 5\' 5"  (1.651 m)   Body mass  index is 20.3 kg/m. GEN: NAD EYE: Sclerae anicteric ENT: MMM CV: Non-tachycardic GI: Soft, NT/ND NEURO:  Alert & Oriented x 3  Lab Results: Recent Labs    07/12/23 1012 07/14/23 1254  WBC 6.6 7.2  HGB 10.2* 9.6*  HCT 31.9* 30.4*  PLT 311 289   BMET Recent Labs    07/12/23 1012 07/14/23 1254  NA 138 136  K 3.7 3.2*  CL 98 98  CO2 28 25  GLUCOSE 115* 141*  BUN 24*  12  CREATININE 0.72 0.63  CALCIUM 9.2 8.8*   LFT Recent Labs    07/14/23 1254  PROT 6.7  ALBUMIN 3.5  AST 16  ALT 15  ALKPHOS 73  BILITOT 1.1   PT/INR No results for input(s): "LABPROT", "INR" in the last 72 hours.   Impression / Plan: This is a 63 y.o.femalewho presents for EGD/EUS for esophageal cancer staging (pretreatment pending evaluation at [facility] for potential treatment).  The risks of an EUS including intestinal perforation, bleeding, infection, aspiration, and medication effects were discussed as was the possibility it may not give a definitive diagnosis if a biopsy is performed.  When a biopsy of the pancreas is done as part of the EUS, there is an additional risk of pancreatitis at the rate of about 1-2%.  It was explained that procedure related pancreatitis is typically mild, although it can be severe and even life threatening, which is why we do not perform random pancreatic biopsies and only biopsy a lesion/area we feel is concerning enough to warrant the risk.  The risks and benefits of endoscopic evaluation/treatment were discussed with the patient and/or family; these include but are not limited to the risk of perforation, infection, bleeding, missed lesions, lack of diagnosis, severe illness requiring hospitalization, as well as anesthesia and sedation related illnesses.  The patient's history has been reviewed, patient examined, no change in status, and deemed stable for procedure.  The patient and/or family is agreeable to proceed.    Corliss Parish, MD [place]  Gastroenterology Advanced Endoscopy Office # [phone]

## 2023-07-15 NOTE — Anesthesia Postprocedure Evaluation (Signed)
 Anesthesia Post Note  Patient: Doris Bender  Procedure(s) Performed: UPPER ENDOSCOPIC ULTRASOUND (EUS) RADIAL     Patient location during evaluation: PACU Anesthesia Type: MAC Level of consciousness: awake and alert Pain management: pain level controlled Vital Signs Assessment: post-procedure vital signs reviewed and stable Respiratory status: spontaneous breathing, nonlabored ventilation, respiratory function stable and patient connected to nasal cannula oxygen Cardiovascular status: stable and blood pressure returned to baseline Postop Assessment: no apparent nausea or vomiting Anesthetic complications: no   No notable events documented.  Last Vitals:  Vitals:   07/15/23 1100 07/15/23 1110  BP: (!) 125/58 (!) 122/59  Pulse: 74 73  Resp: 20 19  Temp:    SpO2: 97% 97%    Last Pain:  Vitals:   07/15/23 1110  TempSrc:   PainSc: 0-No pain                 Earl Lites P Ryliegh Mcduffey

## 2023-07-15 NOTE — Transfer of Care (Signed)
 Immediate Anesthesia Transfer of Care Note  Patient: Doris Bender  Procedure(s) Performed: UPPER ENDOSCOPIC ULTRASOUND (EUS) RADIAL  Patient Location: PACU  Anesthesia Type:MAC  Level of Consciousness: sedated  Airway & Oxygen Therapy: Patient Spontanous Breathing and Patient connected to face mask oxygen  Post-op Assessment: Report given to RN and Post -op Vital signs reviewed and stable  Post vital signs: Reviewed and stable  Last Vitals:  Vitals Value Taken Time  BP 124/65 07/15/23 1040  Temp    Pulse 77 07/15/23  1040  Resp 24 07/15/23 1042  SpO2 98 07/15/23 1042  Vitals shown include unfiled device data.  Last Pain:  Vitals:   07/15/23 0840  TempSrc: Temporal         Complications: No notable events documented.

## 2023-07-15 NOTE — Op Note (Signed)
 [facility] Patient Name: Doris Bender Procedure Date: 07/15/2023 MRN: [id number] Attending MD: Corliss Parish , MD, [id number] Date of Birth: 1961/03/23 CSN: [id number] Age: 63 Admit Type: Outpatient Procedure:                Upper EUS Indications:              Pre-treatment staging of esophageal adenocarcinoma Providers:                Corliss Parish, MD, Margaree Mackintosh, RN,                            Harrington Challenger, Technician Referring MD:              Medicines:                Monitored Anesthesia Care Complications:            No immediate complications. Estimated Blood Loss:     Estimated blood loss was minimal. Procedure:                Pre-Anesthesia Assessment:                           - Prior to the procedure, a History and Physical                            was performed, and patient medications and                            allergies were reviewed. The patient's tolerance of                            previous anesthesia was also reviewed. The risks                            and benefits of the procedure and the sedation                            options and risks were discussed with the patient.                            All questions were answered, and informed consent                            was obtained. Prior Anticoagulants: The patient has                            taken no anticoagulant or antiplatelet agents. ASA                            Grade Assessment: III - A patient with severe                            systemic disease. After reviewing the risks and  benefits, the patient was deemed in satisfactory                            condition to undergo the procedure.                           After obtaining informed consent, the endoscope was                            passed under direct vision. Throughout the                            procedure, the patient's blood pressure, pulse, and                             oxygen saturations were monitored continuously. The                            [id number]-H190 ([id number]) Olympus endoscope was introduced                            through the mouth, and advanced to the second part                            of duodenum. The upper EUS was accomplished without                            difficulty. The patient tolerated the procedure.                            The GF-UE190-AL5 ([id number]) Olympus radial                            ultrasound scope was introduced through the mouth,                            and advanced to the stomach for ultrasound                            examination from the esophagus and stomach. Scope In: Scope Out: Findings:      ENDOSCOPIC FINDING: :      White nummular lesions were noted in the proximal esophagus. Biopsies       were taken with a cold forceps for histology to rule out Candida.      No gross lesions were noted in the mid esophagus.      A large, fungating and ulcerating mass with bleeding and stigmata of       recent bleeding was found in the distal esophagus extending through the       gastroesophageal junction and into the gastric cardia/fundus, 38 to 45       cm from the incisors. The mass was partially obstructing and       circumferential.      Multiple dispersed small erosions with no bleeding and no stigmata of       recent bleeding were  found in the entire examined stomach. Biopsies were       taken with a cold forceps for histology and Helicobacter pylori testing.      No gross lesions were noted in the duodenal bulb, in the first portion       of the duodenum and in the second portion of the duodenum.      ENDOSONOGRAPHIC FINDING: :      A hypoechoic mass was found in the lower third of the esophagus       extending through the gastroesophageal junction and into the gastric       cardia/fundus. The mass was encountered at 39 cm from the incisors and       extended to 46 cm. The lesion was  circumferential. The endosonographic       borders were irregular. The mass measured up to 45 mm by 25 in       thickness. There was sonographic evidence suggesting invasion into and       through the muscularis propria (Layer 4) and into the adventitia (Layer       5).      There was no sign of significant endosonographic abnormality in the rest       of the cervical/thoracic esophagus. No masses were identified.      Three malignant-appearing lymph nodes were visualized, 2 in the lower       paraesophageal mediastinum (level 8L) and 1 in the paracardial region       (level 16). The largest within the pericardial region measured 9 mm by 7       mm in maximal cross-sectional diameter. The nodes were round, hypoechoic       and had well defined margins.      Endosonographic imaging in the visualized portion of the liver showed no       mass.      The celiac region was visualized. Impression:               EGD impression:                           - White nummular lesions in esophageal mucosa found                            in the proximal esophagus. Biopsied.                           - No gross lesions in the mid esophagus.                           - Partially obstructing, malignant esophageal tumor                            (previously biopsied) was found at the distal                            esophagus extending to the gastroesophageal                            junction and into the cardia/fundus.                           -  Erosive gastropathy with no bleeding and no                            stigmata of recent bleeding. Biopsied.                           - No gross lesions in the duodenal bulb, in the                            first portion of the duodenum and in the second                            portion of the duodenum.                           EUS impression:                           - A mass was found in the distal esophagus                            extending to  the gastroesophageal junction and into                            the cardia/fundus. A tissue diagnosis was obtained                            prior to this exam. This is consistent with                            adenocarcinoma. This was staged T3 N2 Mx by                            endosonographic criteria. The staging applies if                            malignancy is confirmed.                           - There was no sign of significant pathology in the                            rest of the thoracic esophagus.                           - Three malignant-appearing lymph nodes were                            visualized in the lower paraesophageal mediastinum                            (level 8L) and paracardial region (level 16).  Tissue has not been obtained. However, the                            endosonographic appearance is highly suspicious for                            metastatic esophageal adenocarcinoma - this would                            correlate with PET/CT findings. Moderate Sedation:      Not Applicable - Patient had care per Anesthesia. Recommendation:           - The patient will be observed post-procedure,                            until all discharge criteria are met.                           - Discharge patient to home.                           - Patient has a contact number available for                            emergencies. The signs and symptoms of potential                            delayed complications were discussed with the                            patient. Return to normal activities tomorrow.                            Written discharge instructions were provided to the                            patient.                           - Continue clear liquid/full liquid diet for now.                           - J-tube placement as per surgery.                           - Observe patient's clinical course.                            - Await path results.                           - Followup with Oncology and radiation oncology and                            Surgical Oncology in regards to next steps in  evaluation and treatment.                           - The findings and recommendations were discussed                            with the patient.                           - The findings and recommendations were discussed                            with the designated responsible adult. Procedure Code(s):        --- Professional ---                           [id number], Esophagogastroduodenoscopy, flexible,                            transoral; with endoscopic ultrasound examination                            limited to the esophagus, stomach or duodenum, and                            adjacent structures                           43239, Esophagogastroduodenoscopy, flexible,                            transoral; with biopsy, single or multiple Diagnosis Code(s):        --- Professional ---                           K22.89, Other specified disease of esophagus                           C16.0, Malignant neoplasm of cardia                           K31.89, Other diseases of stomach and duodenum                           I89.9, Noninfective disorder of lymphatic vessels                            and lymph nodes, unspecified                           C15.9, Malignant neoplasm of esophagus, unspecified CPT copyright 2022 American Medical Association. All rights reserved. The codes documented in this report are preliminary and upon coder review may  be revised to meet current compliance requirements. Corliss Parish, MD 07/15/2023 10:53:23 AM Number of Addenda: 0

## 2023-07-16 ENCOUNTER — Inpatient Hospital Stay

## 2023-07-16 ENCOUNTER — Encounter: Payer: Self-pay | Admitting: Gastroenterology

## 2023-07-16 ENCOUNTER — Telehealth: Payer: Self-pay

## 2023-07-16 ENCOUNTER — Telehealth: Payer: Self-pay | Admitting: Gastroenterology

## 2023-07-16 VITALS — BP 107/69 | HR 90 | Temp 97.7°F | Resp 19

## 2023-07-16 DIAGNOSIS — C160 Malignant neoplasm of cardia: Secondary | ICD-10-CM

## 2023-07-16 MED ORDER — POTASSIUM CHLORIDE IN NACL 40-0.9 MEQ/L-% IV SOLN
250.0000 mL/h | INTRAVENOUS | Status: DC
  Administered 2023-07-16: 250 mL/h via INTRAVENOUS
  Filled 2023-07-16 (×3): qty 1000

## 2023-07-16 NOTE — Telephone Encounter (Signed)
 Inbound call from patient, states pharmacy is stating that omeprazole is not covered by [employer]. She would like to discuss other options.

## 2023-07-16 NOTE — Telephone Encounter (Signed)
 omeprazole (KONVOMEP) 2 mg/mL SUSP oral suspension  Requires prior authorization urgently. 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 Jejunostomy in place for medications and feedings.

## 2023-07-16 NOTE — Progress Notes (Signed)
 Message received from Dr. Anders Simmonds pertaining to the plan of care for patient. Patient will get labs weekly on Mondays, with IV fluids scheduled for the rest of the week without labs. (House fluids) per Dr. Anders Simmonds orders.  Patient will receive fluids 2 x next week on Monday and Thursday due to other appointments on Wednesday. Labs on Monday only. After Monday the patient is to receive house fluids per Dr. Anders Simmonds message.   40 mEq of potassium in 1 liter of normal saline given today per MD orders. Tolerated infusion without adverse affects. Vital signs stable. No complaints at this time. Discharged from clinic by wheel chair in stable condition. Alert and oriented x 3. F/U with [facility] as scheduled.

## 2023-07-16 NOTE — Patient Instructions (Signed)
 [facility] [place] [facility]  Discharge Instructions: Thank you for choosing [facility] to provide your oncology and hematology care.  If you have a lab appointment with the Cancer Center - please note that after April 8th, 2024, all labs will be drawn in the cancer center.  You do not have to check in or register with the main entrance as you have in the past but will complete your check-in in the cancer center.  Wear comfortable clothing and clothing appropriate for easy access to any Portacath or PICC line.   We strive to give you quality time with your provider. You may need to reschedule your appointment if you arrive late (15 or more minutes).  Arriving late affects you and other patients whose appointments are after yours.  Also, if you miss three or more appointments without notifying the office, you may be dismissed from the clinic at the provider's discretion.      For prescription refill requests, have your pharmacy contact our office and allow 72 hours for refills to be completed.    Today you received the following chemotherapy and/or immunotherapy agents 40 mEq of potassium IV in 1 liter of normal saline.       To help prevent nausea and vomiting after your treatment, we encourage you to take your nausea medication as directed.  BELOW ARE SYMPTOMS THAT SHOULD BE REPORTED IMMEDIATELY: *FEVER GREATER THAN 100.4 F (38 C) OR HIGHER *CHILLS OR SWEATING *NAUSEA AND VOMITING THAT IS NOT CONTROLLED WITH YOUR NAUSEA MEDICATION *UNUSUAL SHORTNESS OF BREATH *UNUSUAL BRUISING OR BLEEDING *URINARY PROBLEMS (pain or burning when urinating, or frequent urination) *BOWEL PROBLEMS (unusual diarrhea, constipation, pain near the anus) TENDERNESS IN MOUTH AND THROAT WITH OR WITHOUT PRESENCE OF ULCERS (sore throat, sores in mouth, or a toothache) UNUSUAL RASH, SWELLING OR PAIN  UNUSUAL VAGINAL DISCHARGE OR ITCHING   Items with * indicate a  potential emergency and should be followed up as soon as possible or go to the Emergency Department if any problems should occur.  Please show the CHEMOTHERAPY ALERT CARD or IMMUNOTHERAPY ALERT CARD at check-in to the Emergency Department and triage nurse.  Should you have questions after your visit or need to cancel or reschedule your appointment, please contact [facility] [place] - A DEPT OF [facility] [phone]  and follow the prompts.  Office hours are 8:00 a.m. to 4:30 p.m. Monday - Friday. Please note that voicemails left after 4:00 p.m. may not be returned until the following business day.  We are closed weekends and major holidays. You have access to a nurse at all times for urgent questions. Please call the main number to the clinic [phone] and follow the prompts.  For any non-urgent questions, you may also contact your provider using MyChart. We now offer e-Visits for anyone 56 and older to request care online for non-urgent symptoms. For details visit [email or web address].   Also download the MyChart app! Go to the app store, search "MyChart", open the app, select [place], and log in with your MyChart username and password.

## 2023-07-16 NOTE — Progress Notes (Signed)
 Patient here in clinic today for Potassium IV infusion per Dr. Anders Simmonds. Will give 40 Meq of K+ in one liter of saline per MD.

## 2023-07-16 NOTE — Telephone Encounter (Signed)
 Spoke with patient and pharmacy. Omeprazole suspension will require prior authorization. Pharmacy team notified.

## 2023-07-18 ENCOUNTER — Encounter (HOSPITAL_COMMUNITY): Payer: Self-pay | Admitting: Gastroenterology

## 2023-07-19 ENCOUNTER — Encounter (HOSPITAL_COMMUNITY): Payer: Self-pay

## 2023-07-19 ENCOUNTER — Other Ambulatory Visit (HOSPITAL_COMMUNITY): Payer: Self-pay

## 2023-07-19 ENCOUNTER — Inpatient Hospital Stay

## 2023-07-19 ENCOUNTER — Other Ambulatory Visit: Payer: Self-pay

## 2023-07-19 ENCOUNTER — Telehealth: Payer: Self-pay

## 2023-07-19 ENCOUNTER — Encounter: Payer: Self-pay | Admitting: Oncology

## 2023-07-19 VITALS — BP 106/55 | HR 71 | Temp 97.8°F | Resp 18

## 2023-07-19 DIAGNOSIS — R627 Adult failure to thrive: Secondary | ICD-10-CM | POA: Diagnosis not present

## 2023-07-19 DIAGNOSIS — C160 Malignant neoplasm of cardia: Secondary | ICD-10-CM | POA: Diagnosis not present

## 2023-07-19 DIAGNOSIS — C50412 Malignant neoplasm of upper-outer quadrant of left female breast: Secondary | ICD-10-CM

## 2023-07-19 DIAGNOSIS — E876 Hypokalemia: Secondary | ICD-10-CM

## 2023-07-19 LAB — CBC WITH DIFFERENTIAL/PLATELET
Abs Immature Granulocytes: 0.02 10*3/uL (ref 0.00–0.07)
Basophils Absolute: 0 10*3/uL (ref 0.0–0.1)
Basophils Relative: 1 %
Eosinophils Absolute: 0.1 10*3/uL (ref 0.0–0.5)
Eosinophils Relative: 2 %
HCT: 34.7 % — ABNORMAL LOW (ref 36.0–46.0)
Hemoglobin: 11.2 g/dL — ABNORMAL LOW (ref 12.0–15.0)
Immature Granulocytes: 0 %
Lymphocytes Relative: 11 %
Lymphs Abs: 0.7 10*3/uL (ref 0.7–4.0)
MCH: 28.6 pg (ref 26.0–34.0)
MCHC: 32.3 g/dL (ref 30.0–36.0)
MCV: 88.5 fL (ref 80.0–100.0)
Monocytes Absolute: 0.4 10*3/uL (ref 0.1–1.0)
Monocytes Relative: 6 %
Neutro Abs: 5.3 10*3/uL (ref 1.7–7.7)
Neutrophils Relative %: 80 %
Platelets: 401 10*3/uL — ABNORMAL HIGH (ref 150–400)
RBC: 3.92 MIL/uL (ref 3.87–5.11)
RDW: 14.6 % (ref 11.5–15.5)
WBC: 6.5 10*3/uL (ref 4.0–10.5)
nRBC: 0 % (ref 0.0–0.2)

## 2023-07-19 LAB — COMPREHENSIVE METABOLIC PANEL
ALT: 15 U/L (ref 0–44)
AST: 18 U/L (ref 15–41)
Albumin: 3.6 g/dL (ref 3.5–5.0)
Alkaline Phosphatase: 75 U/L (ref 38–126)
Anion gap: 15 (ref 5–15)
BUN: 7 mg/dL — ABNORMAL LOW (ref 8–23)
CO2: 24 mmol/L (ref 22–32)
Calcium: 9.2 mg/dL (ref 8.9–10.3)
Chloride: 99 mmol/L (ref 98–111)
Creatinine, Ser: 0.76 mg/dL (ref 0.44–1.00)
GFR, Estimated: >60 mL/min (ref >60)
Glucose, Bld: 98 mg/dL (ref 70–99)
Potassium: 3.2 mmol/L — ABNORMAL LOW (ref 3.5–5.1)
Sodium: 138 mmol/L (ref 135–145)
Total Bilirubin: 1 mg/dL (ref 0.0–1.2)
Total Protein: 6.7 g/dL (ref 6.5–8.1)

## 2023-07-19 LAB — ABO/RH: ABO/RH(D): A POS

## 2023-07-19 LAB — SURGICAL PATHOLOGY

## 2023-07-19 LAB — TYPE AND SCREEN
ABO/RH(D): A POS
Antibody Screen: NEGATIVE

## 2023-07-19 LAB — MAGNESIUM: Magnesium: 1.8 mg/dL (ref 1.7–2.4)

## 2023-07-19 MED ORDER — POTASSIUM CHLORIDE IN NACL 40-0.9 MEQ/L-% IV SOLN
INTRAVENOUS | Status: DC
  Filled 2023-07-19 (×2): qty 1000

## 2023-07-19 MED ORDER — MAGNESIUM SULFATE 2 GM/50ML IV SOLN
2.0000 g | Freq: Once | INTRAVENOUS | Status: AC
  Administered 2023-07-19: 2 g via INTRAVENOUS
  Filled 2023-07-19: qty 50

## 2023-07-19 NOTE — Progress Notes (Signed)
 Patient presents today for lab work and IVF per Dr. Marene Lenz order. Blood pressure low on arrival. Patient unable to eat or drink over the weekend . Patient has complaints of nause and dizziness.   Potassium 3.2. Magnesium 1.8. Message sent to Dr. Anders Simmonds. Orders received to administer 40 mEq of Potassium IV in 1 liter of normal saline and 2 grams Magnesium Sulfate.   40 mEq of potassium chloride and 2 grams of magnesium sulfate given today per MD orders. Tolerated infusion without adverse affects. Vital signs stable. Blood pressure improved and symptoms improved per patient's words. No complaints at this time. Discharged from clinic by wheel chair in stable condition. Alert and oriented x 3. F/U with [facility] as scheduled.

## 2023-07-19 NOTE — Telephone Encounter (Signed)
 PA request has been Denied. New Encounter has been or will be created for follow up. For additional info see Pharmacy Prior Auth telephone encounter from 07-19-2023.

## 2023-07-19 NOTE — Telephone Encounter (Signed)
 Dr Meridee Score, please advise on medication.

## 2023-07-19 NOTE — Patient Instructions (Signed)
 [facility] [place] [facility]  Discharge Instructions: Thank you for choosing [facility] to provide your oncology and hematology care.  If you have a lab appointment with the Cancer Center - please note that after April 8th, 2024, all labs will be drawn in the cancer center.  You do not have to check in or register with the main entrance as you have in the past but will complete your check-in in the cancer center.  Wear comfortable clothing and clothing appropriate for easy access to any Portacath or PICC line.   We strive to give you quality time with your provider. You may need to reschedule your appointment if you arrive late (15 or more minutes).  Arriving late affects you and other patients whose appointments are after yours.  Also, if you miss three or more appointments without notifying the office, you may be dismissed from the clinic at the provider's discretion.      For prescription refill requests, have your pharmacy contact our office and allow 72 hours for refills to be completed.    Today you received the following chemotherapy and/or immunotherapy agents 40 mEq of IV potassium, 2 grams of Magnesium sulfate.       To help prevent nausea and vomiting after your treatment, we encourage you to take your nausea medication as directed.  BELOW ARE SYMPTOMS THAT SHOULD BE REPORTED IMMEDIATELY: *FEVER GREATER THAN 100.4 F (38 C) OR HIGHER *CHILLS OR SWEATING *NAUSEA AND VOMITING THAT IS NOT CONTROLLED WITH YOUR NAUSEA MEDICATION *UNUSUAL SHORTNESS OF BREATH *UNUSUAL BRUISING OR BLEEDING *URINARY PROBLEMS (pain or burning when urinating, or frequent urination) *BOWEL PROBLEMS (unusual diarrhea, constipation, pain near the anus) TENDERNESS IN MOUTH AND THROAT WITH OR WITHOUT PRESENCE OF ULCERS (sore throat, sores in mouth, or a toothache) UNUSUAL RASH, SWELLING OR PAIN  UNUSUAL VAGINAL DISCHARGE OR ITCHING   Items with * indicate a  potential emergency and should be followed up as soon as possible or go to the Emergency Department if any problems should occur.  Please show the CHEMOTHERAPY ALERT CARD or IMMUNOTHERAPY ALERT CARD at check-in to the Emergency Department and triage nurse.  Should you have questions after your visit or need to cancel or reschedule your appointment, please contact [facility] [place] - A DEPT OF [facility] [phone]  and follow the prompts.  Office hours are 8:00 a.m. to 4:30 p.m. Monday - Friday. Please note that voicemails left after 4:00 p.m. may not be returned until the following business day.  We are closed weekends and major holidays. You have access to a nurse at all times for urgent questions. Please call the main number to the clinic [phone] and follow the prompts.  For any non-urgent questions, you may also contact your provider using MyChart. We now offer e-Visits for anyone 35 and older to request care online for non-urgent symptoms. For details visit [email or web address].   Also download the MyChart app! Go to the app store, search "MyChart", open the app, select [place], and log in with your MyChart username and password.

## 2023-07-19 NOTE — Telephone Encounter (Signed)
 If patient has Jejunostomy in place now, then can switch back to 40 mg twice daily Omeprazole capsules. Can open capsules if necessary to put through J-tube. Thanks. GM

## 2023-07-19 NOTE — Telephone Encounter (Signed)
 Inbound call from patient requesting a call to discuss denial and if there are alternatives. Please advise, thank you.

## 2023-07-19 NOTE — Telephone Encounter (Signed)
 Pharmacy Patient Advocate Encounter  Received notification from [employer] that Prior Authorization for Konvomep 2-84MG /ML suspension has been DENIED.  Full denial letter will be uploaded to the media tab. See denial reason below.  Coverage is provided in situations where the patient has tried at least four formulary alternatives: esomeprazole DR capsules [Nexium, generics], pantoprazole suspension (granules), lansoprazole oral disintegrating tablets, omeprazole DR capsules. Coverage cannot be authorized at this time.  PA #/Case ID/Reference #: [id number]

## 2023-07-19 NOTE — Telephone Encounter (Signed)
 Pharmacy Patient Advocate Encounter   Received notification from Pt Calls Messages that prior authorization for Konvomep 2-84MG /ML suspension is required/requested.   Insurance verification completed.   The patient is insured through [employer] .   Per test claim: PA required; PA submitted to above mentioned insurance via CoverMyMeds Key/confirmation #/EOC [id number] Status is pending

## 2023-07-20 ENCOUNTER — Encounter (HOSPITAL_COMMUNITY)
Admission: RE | Admit: 2023-07-20 | Discharge: 2023-07-20 | Disposition: A | Discharge status: Home / Self Care | Source: Ambulatory Visit | Attending: General Surgery | Admitting: General Surgery

## 2023-07-20 ENCOUNTER — Other Ambulatory Visit: Payer: Self-pay

## 2023-07-20 ENCOUNTER — Inpatient Hospital Stay (HOSPITAL_COMMUNITY)
Admission: EM | Admit: 2023-07-20 | Discharge: 2023-08-03 | DRG: 357 | Disposition: E | Attending: General Surgery | Admitting: General Surgery

## 2023-07-20 ENCOUNTER — Encounter (HOSPITAL_COMMUNITY): Payer: Self-pay | Admitting: *Deleted

## 2023-07-20 DIAGNOSIS — K567 Ileus, unspecified: Secondary | ICD-10-CM | POA: Diagnosis not present

## 2023-07-20 DIAGNOSIS — K219 Gastro-esophageal reflux disease without esophagitis: Secondary | ICD-10-CM | POA: Diagnosis present

## 2023-07-20 DIAGNOSIS — H5704 Mydriasis: Secondary | ICD-10-CM | POA: Diagnosis not present

## 2023-07-20 DIAGNOSIS — Z87891 Personal history of nicotine dependence: Secondary | ICD-10-CM | POA: Diagnosis not present

## 2023-07-20 DIAGNOSIS — D649 Anemia, unspecified: Secondary | ICD-10-CM

## 2023-07-20 DIAGNOSIS — Z8249 Family history of ischemic heart disease and other diseases of the circulatory system: Secondary | ICD-10-CM | POA: Diagnosis not present

## 2023-07-20 DIAGNOSIS — R627 Adult failure to thrive: Principal | ICD-10-CM | POA: Diagnosis present

## 2023-07-20 DIAGNOSIS — R630 Anorexia: Secondary | ICD-10-CM | POA: Diagnosis present

## 2023-07-20 DIAGNOSIS — C160 Malignant neoplasm of cardia: Secondary | ICD-10-CM | POA: Diagnosis present

## 2023-07-20 DIAGNOSIS — Z01812 Encounter for preprocedural laboratory examination: Secondary | ICD-10-CM | POA: Insufficient documentation

## 2023-07-20 DIAGNOSIS — Z888 Allergy status to other drugs, medicaments and biological substances status: Secondary | ICD-10-CM

## 2023-07-20 DIAGNOSIS — C180 Malignant neoplasm of cecum: Secondary | ICD-10-CM | POA: Insufficient documentation

## 2023-07-20 DIAGNOSIS — Z96659 Presence of unspecified artificial knee joint: Secondary | ICD-10-CM | POA: Diagnosis present

## 2023-07-20 DIAGNOSIS — I7300 Raynaud's syndrome without gangrene: Secondary | ICD-10-CM | POA: Diagnosis present

## 2023-07-20 DIAGNOSIS — D630 Anemia in neoplastic disease: Secondary | ICD-10-CM | POA: Diagnosis present

## 2023-07-20 DIAGNOSIS — Z853 Personal history of malignant neoplasm of breast: Secondary | ICD-10-CM

## 2023-07-20 DIAGNOSIS — Z79899 Other long term (current) drug therapy: Secondary | ICD-10-CM

## 2023-07-20 DIAGNOSIS — M349 Systemic sclerosis, unspecified: Secondary | ICD-10-CM | POA: Diagnosis present

## 2023-07-20 DIAGNOSIS — M549 Dorsalgia, unspecified: Secondary | ICD-10-CM | POA: Diagnosis not present

## 2023-07-20 DIAGNOSIS — M810 Age-related osteoporosis without current pathological fracture: Secondary | ICD-10-CM | POA: Diagnosis present

## 2023-07-20 DIAGNOSIS — Z681 Body mass index (BMI) 19 or less, adult: Secondary | ICD-10-CM

## 2023-07-20 DIAGNOSIS — R092 Respiratory arrest: Secondary | ICD-10-CM | POA: Diagnosis not present

## 2023-07-20 DIAGNOSIS — Z881 Allergy status to other antibiotic agents status: Secondary | ICD-10-CM

## 2023-07-20 DIAGNOSIS — Z833 Family history of diabetes mellitus: Secondary | ICD-10-CM | POA: Diagnosis not present

## 2023-07-20 DIAGNOSIS — R6251 Failure to thrive (child): Secondary | ICD-10-CM | POA: Diagnosis present

## 2023-07-20 DIAGNOSIS — E876 Hypokalemia: Secondary | ICD-10-CM | POA: Diagnosis present

## 2023-07-20 DIAGNOSIS — J95811 Postprocedural pneumothorax: Secondary | ICD-10-CM | POA: Diagnosis not present

## 2023-07-20 DIAGNOSIS — Z923 Personal history of irradiation: Secondary | ICD-10-CM

## 2023-07-20 DIAGNOSIS — F419 Anxiety disorder, unspecified: Secondary | ICD-10-CM | POA: Diagnosis present

## 2023-07-20 DIAGNOSIS — C159 Malignant neoplasm of esophagus, unspecified: Secondary | ICD-10-CM | POA: Diagnosis not present

## 2023-07-20 DIAGNOSIS — J449 Chronic obstructive pulmonary disease, unspecified: Secondary | ICD-10-CM | POA: Diagnosis not present

## 2023-07-20 DIAGNOSIS — R200 Anesthesia of skin: Secondary | ICD-10-CM | POA: Diagnosis not present

## 2023-07-20 DIAGNOSIS — M109 Gout, unspecified: Secondary | ICD-10-CM | POA: Diagnosis present

## 2023-07-20 DIAGNOSIS — Z01818 Encounter for other preprocedural examination: Secondary | ICD-10-CM

## 2023-07-20 DIAGNOSIS — T17818A Gastric contents in other parts of respiratory tract causing other injury, initial encounter: Secondary | ICD-10-CM | POA: Diagnosis not present

## 2023-07-20 DIAGNOSIS — Z885 Allergy status to narcotic agent status: Secondary | ICD-10-CM

## 2023-07-20 DIAGNOSIS — Z886 Allergy status to analgesic agent status: Secondary | ICD-10-CM

## 2023-07-20 LAB — CBC WITH DIFFERENTIAL/PLATELET
Abs Immature Granulocytes: 0.03 10*3/uL (ref 0.00–0.07)
Basophils Absolute: 0 10*3/uL (ref 0.0–0.1)
Basophils Relative: 0 %
Eosinophils Absolute: 0.1 10*3/uL (ref 0.0–0.5)
Eosinophils Relative: 1 %
HCT: 29.1 % — ABNORMAL LOW (ref 36.0–46.0)
Hemoglobin: 9.3 g/dL — ABNORMAL LOW (ref 12.0–15.0)
Immature Granulocytes: 0 %
Lymphocytes Relative: 9 %
Lymphs Abs: 0.7 10*3/uL (ref 0.7–4.0)
MCH: 28.4 pg (ref 26.0–34.0)
MCHC: 32 g/dL (ref 30.0–36.0)
MCV: 88.7 fL (ref 80.0–100.0)
Monocytes Absolute: 0.5 10*3/uL (ref 0.1–1.0)
Monocytes Relative: 6 %
Neutro Abs: 6.4 10*3/uL (ref 1.7–7.7)
Neutrophils Relative %: 84 %
Platelets: 321 10*3/uL (ref 150–400)
RBC: 3.28 MIL/uL — ABNORMAL LOW (ref 3.87–5.11)
RDW: 15 % (ref 11.5–15.5)
WBC: 7.7 10*3/uL (ref 4.0–10.5)
nRBC: 0 % (ref 0.0–0.2)

## 2023-07-20 LAB — COMPREHENSIVE METABOLIC PANEL
ALT: 14 U/L (ref 0–44)
AST: 15 U/L (ref 15–41)
Albumin: 3.5 g/dL (ref 3.5–5.0)
Alkaline Phosphatase: 68 U/L (ref 38–126)
Anion gap: 15 (ref 5–15)
BUN: 8 mg/dL (ref 8–23)
CO2: 22 mmol/L (ref 22–32)
Calcium: 8.9 mg/dL (ref 8.9–10.3)
Chloride: 99 mmol/L (ref 98–111)
Creatinine, Ser: 0.67 mg/dL (ref 0.44–1.00)
GFR, Estimated: >60 mL/min (ref >60)
Glucose, Bld: 104 mg/dL — ABNORMAL HIGH (ref 70–99)
Potassium: 3.3 mmol/L — ABNORMAL LOW (ref 3.5–5.1)
Sodium: 136 mmol/L (ref 135–145)
Total Bilirubin: 0.8 mg/dL (ref 0.0–1.2)
Total Protein: 6.4 g/dL — ABNORMAL LOW (ref 6.5–8.1)

## 2023-07-20 MED ORDER — ONDANSETRON HCL 4 MG PO TABS: 4.0000 mg | ORAL_TABLET | Freq: Four times a day (QID) | ORAL | Status: DC | PRN

## 2023-07-20 MED ORDER — ACETAMINOPHEN 325 MG PO TABS: 650.0000 mg | ORAL_TABLET | Freq: Four times a day (QID) | ORAL | Status: DC | PRN

## 2023-07-20 MED ORDER — SCOPOLAMINE 1 MG/3DAYS TD PT72
1.0000 | MEDICATED_PATCH | TRANSDERMAL | Status: DC
  Administered 2023-07-20: 1.5 mg via TRANSDERMAL
  Filled 2023-07-20: qty 1

## 2023-07-20 MED ORDER — MAGNESIUM HYDROXIDE 400 MG/5ML PO SUSP: 30.0000 mL | Freq: Every day | ORAL | Status: DC | PRN

## 2023-07-20 MED ORDER — HYDROCODONE-ACETAMINOPHEN 5-325 MG PO TABS
0.5000 | ORAL_TABLET | Freq: Four times a day (QID) | ORAL | Status: DC | PRN
Start: 2023-07-20 — End: 2023-07-26
  Filled 2023-07-20: qty 1

## 2023-07-20 MED ORDER — ONDANSETRON HCL 4 MG/2ML IJ SOLN: 4.0000 mg | Freq: Four times a day (QID) | INTRAMUSCULAR | Status: DC | PRN

## 2023-07-20 MED ORDER — OMEPRAZOLE 2 MG/ML ORAL SUSPENSION
40.0000 mg | Freq: Every day | ORAL | Status: DC
Start: 2023-07-21 — End: 2023-07-21
  Filled 2023-07-20 (×2): qty 20

## 2023-07-20 MED ORDER — LACTATED RINGERS IV BOLUS
1000.0000 mL | Freq: Once | INTRAVENOUS | Status: AC
  Administered 2023-07-20: 1000 mL via INTRAVENOUS

## 2023-07-20 MED ORDER — ALBUTEROL (5 MG/ML) CONTINUOUS INHALATION SOLN: 2.5000 mL | INHALATION_SOLUTION | Freq: Four times a day (QID) | RESPIRATORY_TRACT | Status: DC | PRN

## 2023-07-20 MED ORDER — ENOXAPARIN SODIUM 40 MG/0.4ML IJ SOSY
40.0000 mg | PREFILLED_SYRINGE | INTRAMUSCULAR | Status: DC
Start: 2023-07-20 — End: 2023-07-21
  Administered 2023-07-20: 40 mg via SUBCUTANEOUS
  Filled 2023-07-20 (×2): qty 0.4

## 2023-07-20 MED ORDER — ALPRAZOLAM 0.25 MG PO TABS: 0.2500 mg | ORAL_TABLET | ORAL | Status: DC | PRN

## 2023-07-20 MED ORDER — ACETAMINOPHEN 650 MG RE SUPP: 650.0000 mg | Freq: Four times a day (QID) | RECTAL | Status: DC | PRN

## 2023-07-20 MED ORDER — POTASSIUM CHLORIDE 10 MEQ/100ML IV SOLN
10.0000 meq | Freq: Once | INTRAVENOUS | Status: AC
  Administered 2023-07-20: 10 meq via INTRAVENOUS
  Filled 2023-07-20: qty 100

## 2023-07-20 MED ORDER — POTASSIUM CHLORIDE IN NACL 20-0.9 MEQ/L-% IV SOLN
INTRAVENOUS | Status: AC
  Filled 2023-07-20: qty 1000

## 2023-07-20 MED ORDER — TRAZODONE HCL 50 MG PO TABS: 25.0000 mg | ORAL_TABLET | Freq: Every evening | ORAL | Status: DC | PRN

## 2023-07-20 MED ORDER — SUCRALFATE 1 GM/10ML PO SUSP
1.0000 g | Freq: Three times a day (TID) | ORAL | Status: DC
  Administered 2023-07-21: 1 g via ORAL
  Filled 2023-07-20: qty 10

## 2023-07-20 NOTE — Telephone Encounter (Signed)
 Spoke with the patient. She will have placement of the J tube tomorrow. She understand insurance will not cover suspension. I will talk with her 07/22/23 and make certain she is able to use the tube before sending the prescription.

## 2023-07-20 NOTE — ED Notes (Signed)
 Pt care taken, is complaining of abdominal pain. Attempting to place an iv.

## 2023-07-20 NOTE — ED Triage Notes (Signed)
 Pt was instructed by Dr. Lovell Sheehan to come to ED for admission.  Pt is scheduled to have surgery tomorrow. 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

## 2023-07-20 NOTE — Assessment & Plan Note (Addendum)
-   Potassium will be replaced and magnesium level will be checked. ?

## 2023-07-20 NOTE — Assessment & Plan Note (Signed)
-   The patient has been having significant anorexia with her gastroesophageal cancer. - She will be admitted to the medical-surgical bed. - Will keep NPO. - General Surgery consult will be obtained by Dr. Lovell Sheehan who is aware about the patient. - She will be hydrated with IV normal saline with added potassium chloride.

## 2023-07-20 NOTE — H&P (Signed)
 [place]   PATIENT NAME: Doris Bender    MR#:  [id number]  DATE OF BIRTH:  09/20/1960  DATE OF ADMISSION:  07/20/2023  PRIMARY CARE PHYSICIAN: Richardean Chimera, MD   Patient is coming from: Home  REQUESTING/REFERRING PHYSICIAN: Anders Simmonds, DO  CHIEF COMPLAINT:  Failure to thrive   HISTORY OF PRESENT ILLNESS:  Doris Bender is a 63 y.o. female with medical history significant for osteoarthritis, gout, Raynaud's disease, scleroderma, and recently diagnosed gastroesophageal carcinoma, who presented to the emergency room for failure to thrive.  She was expected to have a feeding tube and Port-A-Cath placed tomorrow.  She is unable to eat or drink and feels dehydrated.  She stated that the food has been hanging in the esophagus and she would end up vomiting anything she swallows.  She was advised to come to the emergency room by her surgeon Dr. Lovell Sheehan.  She denied any fever or chills.  No abdominal pain or diarrhea.  She has not had a bowel movement over the last couple days.  No dysuria, oliguria or hematuria or flank pain.  No chest pain or palpitations.  No cough or wheezing or dyspnea.  No bleeding diathesis.  ED Course: When the patient came to the ER, vital signs were within normal.  Labs revealed hypokalemia 3.3 with otherwise unremarkable CMP except for total protein of 6.4.  CBC showed anemia with hemoglobin 9.3 and hematocrit 29.1 compared to 11.2 and 34.7 yesterday however on 3/12 H&H were 9.6 and 30.4.  Blood group was A+ with negative antibody screen. EKG as reviewed by me : EKG showed showed sinus rhythm with a rate of 72 with right axis deviation and T wave inversion anterolaterally and inferiorly. Imaging: Portable chest x-ray showed emphysema with no acute cardiopulmonary disease.  The patient was given 1 L bolus of IV lactated ringer and 10 mill equivalent of IV potassium chloride.  She will be admitted to the medical-surgical bed for further evaluation  and management. PAST MEDICAL HISTORY:   Past Medical History:  Diagnosis Date   Arthritis    lt great toe (RA)   Breast cancer (HCC) 11/15/2012   left   Cancer (HCC)    breast   Compression fracture of L4 lumbar vertebra 11/04/2012   injury   Contact lens/glasses fitting    wears contacts or glasses   Gout 8-9 yrs ago   Rt heel/foot   History of radiation therapy 02/20/13-04/07/13   left breast 50.4 gray, upper outer quadrant boosted to 62.4 gray   Osteoporosis    Pseudogout of joint of left foot 6 yrs ago   Raynaud's disease    bilateral hands and feet   Scleroderma (HCC)    Wears partial dentures    partial lower    PAST SURGICAL HISTORY:   Past Surgical History:  Procedure Laterality Date   BIOPSY  06/16/2023   Procedure: BIOPSY;  Surgeon: Corbin Ade, MD;  [facility];  Service: Endoscopy;;   BREAST LUMPECTOMY WITH NEEDLE LOCALIZATION AND AXILLARY SENTINEL LYMPH NODE BX Left 12/21/2012   Procedure: BREAST LUMPECTOMY WITH NEEDLE LOCALIZATION AND AXILLARY SENTINEL LYMPH NODE BX;  Surgeon: Currie Paris, MD;  Location: [facility];  Service: General;  Laterality: Left;  needle local BCG     BREAST SURGERY  age 9   left breast bx- benign   CERVICAL CONIZATION W/BX  2012   benign   ELBOW SURGERY  ESOPHAGEAL BRUSHING  06/16/2023   Procedure: ESOPHAGEAL BRUSHING;  Surgeon: Corbin Ade, MD;  [facility];  Service: Endoscopy;;   ESOPHAGOGASTRODUODENOSCOPY (EGD) WITH PROPOFOL N/A 06/16/2023   Procedure: ESOPHAGOGASTRODUODENOSCOPY (EGD) WITH PROPOFOL;  Surgeon: Corbin Ade, MD;  [facility];  Service: Endoscopy;  Laterality: N/A;   EUS N/A 07/15/2023   Procedure: UPPER ENDOSCOPIC ULTRASOUND (EUS) RADIAL;  Surgeon: Lemar Lofty., MD;  [facility];  Service: Gastroenterology;  Laterality: N/A;   KNEE ARTHROPLASTY     KNEE ARTHROSCOPY  2006   right   LATERAL EPICONDYLE RELEASE  03/08/2012    Procedure: TENNIS ELBOW RELEASE;  Surgeon: Wyn Forster., MD;  Location: [facility];  Service: Orthopedics;  Laterality: Right;  RECONSTRUCTION OF RIGHT LATERAL ELBOW WITH TENDONS   MASS EXCISION  03/08/2012   Procedure: EXCISION MASS;  Surgeon: Wyn Forster., MD;  Location: [facility];  Service: Orthopedics;  Laterality: Right;  EXCISION OF RIGHT WRIST DORSAL CYST   TENNIS ELBOW RELEASE/NIRSCHEL PROCEDURE Left 09/12/2015   Procedure: ARTHROTOMY LEFT ELBOW, REPAIR EXTENSOR ORIGIN;  Surgeon: Cindee Salt, MD;  Location: [facility];  Service: Orthopedics;  Laterality: Left;   TONSILLECTOMY     TONSILLECTOMY     63yrs old   WRIST GANGLION EXCISION     left x2    SOCIAL HISTORY:   Social History   Tobacco Use   Smoking status: Former    Current packs/day: 0.00    Average packs/day: 0.8 packs/day for 50.0 years (37.5 ttl pk-yrs)    Types: Cigarettes    Start date: 03/20/1972    Quit date: 03/20/2022    Years since quitting: 1.3    Passive exposure: Never   Smokeless tobacco: Never  Substance Use Topics   Alcohol use: Not Currently    Comment: occ    FAMILY HISTORY:   Family History  Problem Relation Age of Onset   Hyperthyroidism Mother    Cirrhosis Father    Diabetes Brother    Heart attack Maternal Grandmother    Heart attack Maternal Grandfather     DRUG ALLERGIES:   Allergies  Allergen Reactions   Hydroxychloroquine Nausea And Vomiting and Other (See Comments)    Back, stomach/ abd pain.   Levofloxacin Other (See Comments)    Hallucinations, Dizziness.    Prednisone Nausea And Vomiting    Abdominal Pain   Doxycycline     dizzy   Ibuprofen Hives   Keflex [Cephalexin] Hives   Ketorolac Hives   Lodine [Etodolac] Hives   Oxycodone Nausea And Vomiting   Robaxin [Methocarbamol] Hives    REVIEW OF SYSTEMS:   ROS As per history of present illness. All pertinent systems were reviewed above. Constitutional,  HEENT, cardiovascular, respiratory, GI, GU, musculoskeletal, neuro, psychiatric, endocrine, integumentary and hematologic systems were reviewed and are otherwise negative/unremarkable except for positive findings mentioned above in the HPI.   MEDICATIONS AT HOME:   Prior to Admission medications   Medication Sig Start Date End Date Taking? Authorizing Provider  acetaminophen (TYLENOL) 160 MG/5ML liquid Take 325 mg by mouth every 4 (four) hours as needed for fever.    [provider]  acetaminophen (TYLENOL) 650 MG CR tablet Take 650 mg by mouth every 8 (eight) hours as needed for pain.    [provider]  albuterol (VENTOLIN HFA) 108 (90 Base) MCG/ACT inhaler Inhale 1-2 puffs into the lungs every 6 (six) hours as needed  for wheezing or shortness of breath. 10/08/21   [provider]  ALPRAZolam Prudy Feeler) 0.25 MG tablet Take 0.25 mg by mouth as needed for anxiety.    [provider]  HYDROcodone-acetaminophen (NORCO/VICODIN) 5-325 MG tablet Take 1 tablet by mouth every 6 (six) hours as needed for moderate pain. Patient taking differently: Take 0.5 tablets by mouth every 6 (six) hours as needed for moderate pain (pain score 4-6). 12/13/18   Hilts, Casimiro Needle, MD  omeprazole (KONVOMEP) 2 mg/mL SUSP oral suspension Take 20 mLs (40 mg total) by mouth daily. 07/15/23   Mansouraty, Netty Starring., MD  ondansetron (ZOFRAN) 4 MG tablet Take 1 tablet (4 mg total) by mouth daily as needed for nausea or vomiting. 06/17/23 06/16/24  Mdala-Gausi, Gwenette Greet, MD  ondansetron (ZOFRAN-ODT) 8 MG disintegrating tablet Take 1 tablet (8 mg total) by mouth every 8 (eight) hours as needed for nausea or vomiting. 07/08/23   Cindie Crumbly, MD  scopolamine (TRANSDERM-SCOP) 1 MG/3DAYS Place 1 patch (1.5 mg total) onto the skin every 3 (three) days. 06/28/23   Cindie Crumbly, MD  sucralfate (CARAFATE) 1 GM/10ML suspension Take 10 mLs (1 g total) by mouth 4 (four) times daily -  with meals and at  bedtime. 06/28/23   Cindie Crumbly, MD      VITAL SIGNS:  Blood pressure 109/72, pulse 92, temperature 98.9 F (37.2 C), temperature source Oral, resp. rate 17, height 5\' 5"  (1.651 m), weight 54 kg, SpO2 98%.  PHYSICAL EXAMINATION:  Physical Exam  GENERAL:  63 y.o.-year-old Caucasian female patient lying in the bed with no acute distress.  EYES: Pupils equal, round, reactive to light and accommodation. No scleral icterus. Extraocular muscles intact.  HEENT: Head atraumatic, normocephalic. Oropharynx and nasopharynx clear.  NECK:  Supple, no jugular venous distention. No thyroid enlargement, no tenderness.  LUNGS: Normal breath sounds bilaterally, no wheezing, rales,rhonchi or crepitation. No use of accessory muscles of respiration.  CARDIOVASCULAR: Regular rate and rhythm, S1, S2 normal. No murmurs, rubs, or gallops.  ABDOMEN: Soft, nondistended, nontender. Bowel sounds present. No organomegaly or mass.  EXTREMITIES: No pedal edema, cyanosis, or clubbing.  NEUROLOGIC: Cranial nerves II through XII are intact. Muscle strength 5/5 in all extremities. Sensation intact. Gait not checked.  PSYCHIATRIC: The patient is alert and oriented x 3.  Normal affect and good eye contact. SKIN: No obvious rash, lesion, or ulcer.   LABORATORY PANEL:   CBC Recent Labs  Lab 07/20/23 1920  WBC 7.7  HGB 9.3*  HCT 29.1*  PLT 321   ------------------------------------------------------------------------------------------------------------------  Chemistries  Recent Labs  Lab 07/19/23 0819 07/20/23 1920  NA 138 136  K 3.2* 3.3*  CL 99 99  CO2 24 22  GLUCOSE 98 104*  BUN 7* 8  CREATININE 0.76 0.67  CALCIUM 9.2 8.9  MG 1.8  --   AST 18 15  ALT 15 14  ALKPHOS 75 68  BILITOT 1.0 0.8   ------------------------------------------------------------------------------------------------------------------  Cardiac Enzymes No results for input(s): "TROPONINI" in the last 168  hours. ------------------------------------------------------------------------------------------------------------------  RADIOLOGY:  No results found.    IMPRESSION AND PLAN:  Assessment and Plan: * Failure to thrive (child) - The patient has been having significant anorexia with her gastroesophageal cancer. - She will be admitted to the medical-surgical bed. - Will keep NPO. - General Surgery consult will be obtained by Dr. Lovell Sheehan who is aware about the patient. - She will be hydrated with IV normal saline with added potassium chloride.  Hypokalemia - Potassium will be  replaced and magnesium level will be checked.  Normocytic anemia - This is acute on chronic anemia. - We will follow H&H. - She was typed and screened. - At this time she does not need transfusion.  Anxiety - We will continue her Xanax.  GERD (gastroesophageal reflux disease) - We will continue PPI therapy as well as Carafate.   DVT prophylaxis: Lovenox. Advanced Care Planning:  Code Status: full code. Family Communication:  The plan of care was discussed in details with the patient (and family). I answered all questions. The patient agreed to proceed with the above mentioned plan. Further management will depend upon hospital course. Disposition Plan: Back to previous home environment Consults called: General Surgery. All the records are reviewed and case discussed with ED provider.  Status is: Inpatient  At the time of the admission, it appears that the appropriate admission status for this patient is inpatient.  This is judged to be reasonable and necessary in order to provide the required intensity of service to ensure the patient's safety given the presenting symptoms, physical exam findings and initial radiographic and laboratory data in the context of comorbid conditions.  The patient requires inpatient status due to high intensity of service, high risk of further deterioration and high frequency of  surveillance required.  I certify that at the time of admission, it is my clinical judgment that the patient will require inpatient hospital care extending more than 2 midnights.                            Dispo: The patient is from: Home              Anticipated d/c is to: Home              Patient currently is not medically stable to d/c.              Difficult to place patient: No  Hannah Beat M.D on 07/20/2023 at 8:47 PM  [facility]   From 7 PM-7 AM, contact night-coverage [email or web address]  CC: Primary care physician; Richardean Chimera, MD

## 2023-07-20 NOTE — ED Provider Notes (Signed)
 [place] EMERGENCY DEPARTMENT AT [facility] Provider Note   CSN: [id number] Arrival date & time: 07/20/23  1816     History  No chief complaint on file.   Doris Bender is a 63 y.o. female.  63 year old female here today due to failure to thrive.  Patient has gastroesophageal cancer, is getting a feeding tube and port placed tomorrow morning.  Has been unable to eat or drink.  Was advised to come here by her surgeon Dr. Lovell Sheehan.        Home Medications Prior to Admission medications   Medication Sig Start Date End Date Taking? Authorizing Provider  acetaminophen (TYLENOL) 160 MG/5ML liquid Take 325 mg by mouth every 4 (four) hours as needed for fever.    [provider]  acetaminophen (TYLENOL) 650 MG CR tablet Take 650 mg by mouth every 8 (eight) hours as needed for pain.    [provider]  albuterol (VENTOLIN HFA) 108 (90 Base) MCG/ACT inhaler Inhale 1-2 puffs into the lungs every 6 (six) hours as needed for wheezing or shortness of breath. 10/08/21   [provider]  ALPRAZolam Prudy Feeler) 0.25 MG tablet Take 0.25 mg by mouth as needed for anxiety.    [provider]  HYDROcodone-acetaminophen (NORCO/VICODIN) 5-325 MG tablet Take 1 tablet by mouth every 6 (six) hours as needed for moderate pain. Patient taking differently: Take 0.5 tablets by mouth every 6 (six) hours as needed for moderate pain (pain score 4-6). 12/13/18   Hilts, Casimiro Needle, MD  omeprazole (KONVOMEP) 2 mg/mL SUSP oral suspension Take 20 mLs (40 mg total) by mouth daily. 07/15/23   Mansouraty, Netty Starring., MD  ondansetron (ZOFRAN) 4 MG tablet Take 1 tablet (4 mg total) by mouth daily as needed for nausea or vomiting. 06/17/23 06/16/24  Mdala-Gausi, Gwenette Greet, MD  ondansetron (ZOFRAN-ODT) 8 MG disintegrating tablet Take 1 tablet (8 mg total) by mouth every 8 (eight) hours as needed for nausea or vomiting. 07/08/23   Cindie Crumbly, MD  scopolamine (TRANSDERM-SCOP) 1  MG/3DAYS Place 1 patch (1.5 mg total) onto the skin every 3 (three) days. 06/28/23   Cindie Crumbly, MD  sucralfate (CARAFATE) 1 GM/10ML suspension Take 10 mLs (1 g total) by mouth 4 (four) times daily -  with meals and at bedtime. 06/28/23   Cindie Crumbly, MD      Allergies    Hydroxychloroquine, Levofloxacin, Prednisone, Doxycycline, Ibuprofen, Keflex [cephalexin], Ketorolac, Lodine [etodolac], Oxycodone, and Robaxin [methocarbamol]    Review of Systems   Review of Systems  Physical Exam Updated Vital Signs BP 109/72   Pulse 92   Temp 98.9 F (37.2 C) (Oral)   Resp 17   Ht 5\' 5"  (1.651 m)   Wt 54 kg   SpO2 98%   BMI 19.80 kg/m  Physical Exam Vitals reviewed.  Cardiovascular:     Rate and Rhythm: Normal rate.  Abdominal:     General: Abdomen is flat.     Palpations: Abdomen is soft.  Neurological:     Mental Status: She is alert.     Motor: No weakness.     ED Results / Procedures / Treatments   Labs (all labs ordered are listed, but only abnormal results are displayed) Labs Reviewed  COMPREHENSIVE METABOLIC PANEL - Abnormal; Notable for the following components:      Result Value   Potassium 3.3 (*)    Glucose, Bld 104 (*)    Total Protein 6.4 (*)    All other components  within normal limits  CBC WITH DIFFERENTIAL/PLATELET - Abnormal; Notable for the following components:   RBC 3.28 (*)    Hemoglobin 9.3 (*)    HCT 29.1 (*)    All other components within normal limits    EKG None  Radiology No results found.  Procedures Procedures    Medications Ordered in ED Medications  lactated ringers bolus 1,000 mL (has no administration in time range)  potassium chloride 10 mEq in 100 mL IVPB (has no administration in time range)    ED Course/ Medical Decision Making/ A&P                                 Medical Decision Making 63 year old female here today for failure to thrive, inability to tolerate p.o.  Plan-patient a bit weak, has been unable  to eat or drink over the last couple of days.  I provided her IV fluids, potassium.  Spoke with her surgeon Dr. Lovell Sheehan.  She is going to have these procedures done tomorrow.  N.p.o. after midnight.  Will admit patient to hospitalist.  Amount and/or Complexity of Data Reviewed Labs: ordered.  Risk Prescription drug management.           Final Clinical Impression(s) / ED Diagnoses Final diagnoses:  Failure to thrive in adult    Rx / DC Orders ED Discharge Orders     None         Arletha Pili, DO 07/20/23 1949

## 2023-07-20 NOTE — Telephone Encounter (Signed)
 Please start an appeal.  My mistake. She does not have a J tube yet. She cannot swallow solids due to the cancer. Thanks

## 2023-07-20 NOTE — Assessment & Plan Note (Signed)
-   This is acute on chronic anemia. - We will follow H&H. - She was typed and screened. - At this time she does not need transfusion.

## 2023-07-20 NOTE — Assessment & Plan Note (Signed)
-   We will continue PPI therapy as well as Carafate.

## 2023-07-20 NOTE — Telephone Encounter (Signed)
 Inbound call from patient, would like to speak with a nurse, states she spoke to her insurance and would like to speak further in regards.

## 2023-07-20 NOTE — Telephone Encounter (Signed)
Called the patient. No answer. Left a message of my call.

## 2023-07-20 NOTE — Assessment & Plan Note (Signed)
-  We will continue her Xanax.

## 2023-07-21 ENCOUNTER — Inpatient Hospital Stay (HOSPITAL_COMMUNITY): Admitting: Anesthesiology

## 2023-07-21 ENCOUNTER — Encounter: Payer: Self-pay | Admitting: *Deleted

## 2023-07-21 ENCOUNTER — Inpatient Hospital Stay (HOSPITAL_COMMUNITY)

## 2023-07-21 ENCOUNTER — Ambulatory Visit (HOSPITAL_COMMUNITY): Admission: RE | Admit: 2023-07-21 | Source: Home / Self Care | Admitting: General Surgery

## 2023-07-21 ENCOUNTER — Inpatient Hospital Stay

## 2023-07-21 ENCOUNTER — Encounter (HOSPITAL_COMMUNITY): Payer: Self-pay | Admitting: Family Medicine

## 2023-07-21 ENCOUNTER — Telehealth: Payer: Self-pay | Admitting: Pharmacist

## 2023-07-21 ENCOUNTER — Encounter (HOSPITAL_COMMUNITY): Admission: EM | Disposition: E | Payer: Self-pay | Source: Home / Self Care | Attending: General Surgery

## 2023-07-21 ENCOUNTER — Telehealth: Payer: Self-pay | Admitting: *Deleted

## 2023-07-21 ENCOUNTER — Inpatient Hospital Stay: Admitting: Oncology

## 2023-07-21 DIAGNOSIS — J449 Chronic obstructive pulmonary disease, unspecified: Secondary | ICD-10-CM

## 2023-07-21 DIAGNOSIS — Z87891 Personal history of nicotine dependence: Secondary | ICD-10-CM

## 2023-07-21 DIAGNOSIS — Z01818 Encounter for other preprocedural examination: Secondary | ICD-10-CM

## 2023-07-21 DIAGNOSIS — C159 Malignant neoplasm of esophagus, unspecified: Secondary | ICD-10-CM

## 2023-07-21 DIAGNOSIS — C160 Malignant neoplasm of cardia: Secondary | ICD-10-CM

## 2023-07-21 DIAGNOSIS — R627 Adult failure to thrive: Secondary | ICD-10-CM | POA: Diagnosis not present

## 2023-07-21 HISTORY — PX: PORTACATH PLACEMENT: SHX2246

## 2023-07-21 LAB — BASIC METABOLIC PANEL
Anion gap: 11 (ref 5–15)
BUN: 6 mg/dL — ABNORMAL LOW (ref 8–23)
CO2: 24 mmol/L (ref 22–32)
Calcium: 8.3 mg/dL — ABNORMAL LOW (ref 8.9–10.3)
Chloride: 102 mmol/L (ref 98–111)
Creatinine, Ser: 0.57 mg/dL (ref 0.44–1.00)
GFR, Estimated: >60 mL/min (ref >60)
Glucose, Bld: 92 mg/dL (ref 70–99)
Potassium: 3.8 mmol/L (ref 3.5–5.1)
Sodium: 137 mmol/L (ref 135–145)

## 2023-07-21 LAB — CBC
HCT: 27.9 % — ABNORMAL LOW (ref 36.0–46.0)
Hemoglobin: 8.7 g/dL — ABNORMAL LOW (ref 12.0–15.0)
MCH: 27.6 pg (ref 26.0–34.0)
MCHC: 31.2 g/dL (ref 30.0–36.0)
MCV: 88.6 fL (ref 80.0–100.0)
Platelets: 281 10*3/uL (ref 150–400)
RBC: 3.15 MIL/uL — ABNORMAL LOW (ref 3.87–5.11)
RDW: 14.8 % (ref 11.5–15.5)
WBC: 6.5 10*3/uL (ref 4.0–10.5)
nRBC: 0 % (ref 0.0–0.2)

## 2023-07-21 LAB — GLUCOSE, CAPILLARY: Glucose-Capillary: 87 mg/dL (ref 70–99)

## 2023-07-21 LAB — HIV ANTIBODY (ROUTINE TESTING W REFLEX): HIV Screen 4th Generation wRfx: NONREACTIVE

## 2023-07-21 SURGERY — INSERTION, JEJUNOSTOMY TUBE, ROBOT-ASSISTED
Anesthesia: General | Site: Chest | Laterality: Left

## 2023-07-21 MED ORDER — ORAL CARE MOUTH RINSE: 15.0000 mL | Freq: Once | OROMUCOSAL | Status: AC

## 2023-07-21 MED ORDER — FENTANYL CITRATE PF 50 MCG/ML IJ SOSY: 25.0000 ug | PREFILLED_SYRINGE | INTRAMUSCULAR | Status: DC | PRN

## 2023-07-21 MED ORDER — PANTOPRAZOLE SODIUM 40 MG IV SOLR
40.0000 mg | Freq: Every day | INTRAVENOUS | Status: DC
  Administered 2023-07-21 – 2023-07-25 (×5): 40 mg via INTRAVENOUS
  Filled 2023-07-21 (×5): qty 10

## 2023-07-21 MED ORDER — HEPARIN SOD (PORK) LOCK FLUSH 100 UNIT/ML IV SOLN
INTRAVENOUS | Status: AC
  Filled 2023-07-21: qty 5

## 2023-07-21 MED ORDER — ENOXAPARIN SODIUM 30 MG/0.3ML IJ SOSY: 30.0000 mg | PREFILLED_SYRINGE | INTRAMUSCULAR | Status: DC

## 2023-07-21 MED ORDER — SODIUM CHLORIDE 0.9 % IV SOLN: 12.5000 mg | INTRAVENOUS | Status: DC | PRN

## 2023-07-21 MED ORDER — LIDOCAINE HCL (PF) 2 % IJ SOLN
INTRAMUSCULAR | Status: DC | PRN
  Administered 2023-07-21: 40 mg via INTRADERMAL

## 2023-07-21 MED ORDER — ACETAMINOPHEN 650 MG RE SUPP: 650.0000 mg | Freq: Four times a day (QID) | RECTAL | Status: DC | PRN

## 2023-07-21 MED ORDER — HEPARIN SOD (PORK) LOCK FLUSH 100 UNIT/ML IV SOLN
INTRAVENOUS | Status: DC | PRN
  Administered 2023-07-21: 500 [IU] via INTRAVENOUS

## 2023-07-21 MED ORDER — BUPIVACAINE HCL (PF) 0.5 % IJ SOLN
INTRAMUSCULAR | Status: AC
  Filled 2023-07-21: qty 30

## 2023-07-21 MED ORDER — PANTOPRAZOLE SODIUM 40 MG PO TBEC: 40.0000 mg | DELAYED_RELEASE_TABLET | Freq: Every day | ORAL | Status: DC

## 2023-07-21 MED ORDER — MIDAZOLAM HCL 2 MG/2ML IJ SOLN
INTRAMUSCULAR | Status: AC
  Filled 2023-07-21: qty 2

## 2023-07-21 MED ORDER — ACETAMINOPHEN 325 MG PO TABS: 650.0000 mg | ORAL_TABLET | Freq: Four times a day (QID) | ORAL | Status: DC | PRN

## 2023-07-21 MED ORDER — ROCURONIUM BROMIDE 10 MG/ML (PF) SYRINGE
PREFILLED_SYRINGE | INTRAVENOUS | Status: DC | PRN
  Administered 2023-07-21: 80 mg via INTRAVENOUS

## 2023-07-21 MED ORDER — STERILE WATER FOR INJECTION IJ SOLN
INTRAMUSCULAR | Status: DC | PRN
  Administered 2023-07-21: 10 mL via JEJUNOSTOMY

## 2023-07-21 MED ORDER — ENOXAPARIN SODIUM 40 MG/0.4ML IJ SOSY
40.0000 mg | PREFILLED_SYRINGE | Freq: Once | INTRAMUSCULAR | Status: DC
Start: 2023-07-21 — End: 2023-07-21

## 2023-07-21 MED ORDER — SODIUM CHLORIDE 0.9 % IV SOLN: INTRAVENOUS | Status: DC

## 2023-07-21 MED ORDER — BUPIVACAINE HCL (PF) 0.5 % IJ SOLN
INTRAMUSCULAR | Status: DC | PRN
  Administered 2023-07-21: 30 mL

## 2023-07-21 MED ORDER — ONDANSETRON 4 MG PO TBDP
4.0000 mg | ORAL_TABLET | Freq: Four times a day (QID) | ORAL | Status: DC | PRN
  Filled 2023-07-21: qty 1

## 2023-07-21 MED ORDER — LACTATED RINGERS IV SOLN: INTRAVENOUS | Status: DC

## 2023-07-21 MED ORDER — HYDROMORPHONE HCL 1 MG/ML IJ SOLN
INTRAMUSCULAR | Status: AC
  Filled 2023-07-21: qty 0.5

## 2023-07-21 MED ORDER — ONDANSETRON HCL 4 MG/2ML IJ SOLN
INTRAMUSCULAR | Status: DC | PRN
  Administered 2023-07-21: 4 mg via INTRAVENOUS

## 2023-07-21 MED ORDER — HYDROMORPHONE HCL 1 MG/ML IJ SOLN
INTRAMUSCULAR | Status: DC | PRN
  Administered 2023-07-21 (×2): .25 mg via INTRAVENOUS
  Administered 2023-07-21: .5 mg via INTRAVENOUS

## 2023-07-21 MED ORDER — CHLORHEXIDINE GLUCONATE 0.12 % MT SOLN
15.0000 mL | Freq: Once | OROMUCOSAL | Status: AC
  Administered 2023-07-21: 15 mL via OROMUCOSAL

## 2023-07-21 MED ORDER — CHLORHEXIDINE GLUCONATE CLOTH 2 % EX PADS
6.0000 | MEDICATED_PAD | Freq: Once | CUTANEOUS | Status: AC
  Administered 2023-07-21: 6 via TOPICAL

## 2023-07-21 MED ORDER — SIMETHICONE 80 MG PO CHEW
40.0000 mg | CHEWABLE_TABLET | Freq: Four times a day (QID) | ORAL | Status: DC | PRN
  Administered 2023-07-25: 40 mg via ORAL
  Filled 2023-07-21 (×2): qty 1

## 2023-07-21 MED ORDER — FENTANYL CITRATE (PF) 100 MCG/2ML IJ SOLN
INTRAMUSCULAR | Status: AC
  Filled 2023-07-21: qty 2

## 2023-07-21 MED ORDER — CHLORHEXIDINE GLUCONATE CLOTH 2 % EX PADS: 6.0000 | MEDICATED_PAD | Freq: Once | CUTANEOUS | Status: DC

## 2023-07-21 MED ORDER — CEFAZOLIN SODIUM-DEXTROSE 2-4 GM/100ML-% IV SOLN
INTRAVENOUS | Status: AC
  Filled 2023-07-21: qty 100

## 2023-07-21 MED ORDER — HYDROMORPHONE HCL 1 MG/ML IJ SOLN
1.0000 mg | INTRAMUSCULAR | Status: DC | PRN
  Administered 2023-07-24 – 2023-07-26 (×6): 1 mg via INTRAVENOUS
  Filled 2023-07-21 (×8): qty 1

## 2023-07-21 MED ORDER — CEFAZOLIN SODIUM-DEXTROSE 2-4 GM/100ML-% IV SOLN
2.0000 g | INTRAVENOUS | Status: AC
  Administered 2023-07-21: 2 g via INTRAVENOUS

## 2023-07-21 MED ORDER — PROPOFOL 10 MG/ML IV BOLUS
INTRAVENOUS | Status: DC | PRN
  Administered 2023-07-21: 30 mg via INTRAVENOUS
  Administered 2023-07-21: 120 mg via INTRAVENOUS

## 2023-07-21 MED ORDER — SODIUM CHLORIDE (PF) 0.9 % IJ SOLN
INTRAMUSCULAR | Status: DC | PRN
  Administered 2023-07-21: 10 mL via INTRAVENOUS

## 2023-07-21 MED ORDER — ONDANSETRON HCL 4 MG/2ML IJ SOLN
4.0000 mg | Freq: Four times a day (QID) | INTRAMUSCULAR | Status: DC | PRN
  Administered 2023-07-23 – 2023-07-24 (×4): 4 mg via INTRAVENOUS
  Filled 2023-07-21 (×6): qty 2

## 2023-07-21 MED ORDER — LIDOCAINE HCL (PF) 2 % IJ SOLN
INTRAMUSCULAR | Status: AC
  Filled 2023-07-21: qty 5

## 2023-07-21 MED ORDER — OSMOLITE 1.2 CAL PO LIQD
1000.0000 mL | ORAL | Status: DC
  Administered 2023-07-21 – 2023-07-22 (×2): 1000 mL
  Filled 2023-07-21 (×3): qty 1000

## 2023-07-21 MED ORDER — ROCURONIUM BROMIDE 10 MG/ML (PF) SYRINGE
PREFILLED_SYRINGE | INTRAVENOUS | Status: AC
  Filled 2023-07-21: qty 10

## 2023-07-21 MED ORDER — SUGAMMADEX SODIUM 200 MG/2ML IV SOLN
INTRAVENOUS | Status: DC | PRN
  Administered 2023-07-21: 150 mg via INTRAVENOUS

## 2023-07-21 MED ORDER — LIDOCAINE HCL (PF) 1 % IJ SOLN
INTRAMUSCULAR | Status: AC
  Filled 2023-07-21: qty 30

## 2023-07-21 SURGICAL SUPPLY — 57 items
APPLICATOR CHLORAPREP 10.5 ORG (MISCELLANEOUS) ×2 IMPLANT
BAG DECANTER FOR FLEXI CONT (MISCELLANEOUS) ×2 IMPLANT
CLOTH BEACON ORANGE TIMEOUT ST (SAFETY) ×2 IMPLANT
COVER LIGHT HANDLE STERIS (MISCELLANEOUS) ×4 IMPLANT
COVER MAYO STAND XLG (MISCELLANEOUS) ×2 IMPLANT
COVER TIP SHEARS 8 DVNC (MISCELLANEOUS) IMPLANT
DECANTER SPIKE VIAL GLASS SM (MISCELLANEOUS) ×2 IMPLANT
DERMABOND ADVANCED .7 DNX12 (GAUZE/BANDAGES/DRESSINGS) ×2 IMPLANT
DERMABOND ADVANCED .7 DNX6 (GAUZE/BANDAGES/DRESSINGS) IMPLANT
DRAPE ARM DVNC X/XI (DISPOSABLE) ×6 IMPLANT
DRAPE C-ARM FOLDED MOBILE STRL (DRAPES) ×2 IMPLANT
DRAPE COLUMN DVNC XI (DISPOSABLE) ×2 IMPLANT
DRAPE UTILITY W/TAPE 26X15 (DRAPES) IMPLANT
DRIVER NDL MEGA SUTCUT DVNCXI (INSTRUMENTS) ×2 IMPLANT
DRIVER NDLE MEGA SUTCUT DVNCXI (INSTRUMENTS) ×2 IMPLANT
ELECT REM PT RETURN 9FT ADLT (ELECTROSURGICAL) ×2 IMPLANT
ELECTRODE REM PT RTRN 9FT ADLT (ELECTROSURGICAL) ×2 IMPLANT
FORCEPS BPLR 8 MD DVNC XI (FORCEP) IMPLANT
GLOVE BIOGEL M 7.0 STRL (GLOVE) IMPLANT
GLOVE BIOGEL PI IND STRL 7.0 (GLOVE) ×4 IMPLANT
GLOVE SURG SS PI 7.5 STRL IVOR (GLOVE) ×4 IMPLANT
GOWN STRL REUS W/TWL LRG LVL3 (GOWN DISPOSABLE) ×6 IMPLANT
GRASPER TIP-UP FEN DVNC XI (INSTRUMENTS) ×2 IMPLANT
IRRIGATOR SUCT 8 DISP DVNC XI (IRRIGATION / IRRIGATOR) IMPLANT
IV NS 500ML BAXH (IV SOLUTION) ×2 IMPLANT
J-TUBE MIC 18FX51 UNV ENFIT (TUBING) ×2 IMPLANT
KIT PORT POWER 8FR ISP MRI (Port) ×2 IMPLANT
KIT TURNOVER KIT A (KITS) ×2 IMPLANT
MANIFOLD NEPTUNE II (INSTRUMENTS) ×2 IMPLANT
NDL HYPO 21X1.5 SAFETY (NEEDLE) ×2 IMPLANT
NDL HYPO 25X1 1.5 SAFETY (NEEDLE) ×2 IMPLANT
NDL INSUFFLATION 14GA 120MM (NEEDLE) ×2 IMPLANT
NEEDLE HYPO 21X1.5 SAFETY (NEEDLE) ×2 IMPLANT
NEEDLE HYPO 25X1 1.5 SAFETY (NEEDLE) ×2 IMPLANT
NEEDLE INSUFFLATION 14GA 120MM (NEEDLE) ×2 IMPLANT
OBTURATOR OPTICAL STND 8 DVNC (TROCAR) ×2 IMPLANT
OBTURATOR OPTICALSTD 8 DVNC (TROCAR) ×2 IMPLANT
PACK LAP CHOLE LZT030E (CUSTOM PROCEDURE TRAY) ×2 IMPLANT
PACK MINOR (CUSTOM PROCEDURE TRAY) ×2 IMPLANT
PAD ARMBOARD POSITIONER FOAM (MISCELLANEOUS) ×2 IMPLANT
PENCIL HANDSWITCHING (ELECTRODE) ×2 IMPLANT
POSITIONER HEAD 8X9X4 ADT (SOFTGOODS) ×2 IMPLANT
SCISSORS MNPLR CVD DVNC XI (INSTRUMENTS) IMPLANT
SEAL UNIV 5-12 XI (MISCELLANEOUS) ×6 IMPLANT
SET BASIN LINEN APH (SET/KITS/TRAYS/PACK) ×2 IMPLANT
SET TUBE DA VINCI INSUFFLATOR (TUBING) IMPLANT
SUT ETHILON 3 0 PS 1 (SUTURE) IMPLANT
SUT MNCRL AB 4-0 PS2 18 (SUTURE) ×4 IMPLANT
SUT STRATA 3-0 SH (SUTURE) IMPLANT
SUT STRATAFIX SPIRAL 3-0 PDS+ (SUTURE) ×2 IMPLANT
SUT VIC AB 3-0 SH 27X BRD (SUTURE) ×2 IMPLANT
SUTURE STRATFX SPIRAL 3-0 PDS+ (SUTURE) ×2 IMPLANT
SYR 5ML LL (SYRINGE) ×2 IMPLANT
SYR CONTROL 10ML LL (SYRINGE) ×2 IMPLANT
TAPE TRANSPORE STRL 2 31045 (GAUZE/BANDAGES/DRESSINGS) ×2 IMPLANT
TUBE JEJUNAL 18FR ENFIT (TUBING) ×2 IMPLANT
WATER STERILE IRR 500ML POUR (IV SOLUTION) ×2 IMPLANT

## 2023-07-21 NOTE — Progress Notes (Signed)
 [facility]  Small apical ptx noted on chest x-ray taken at 1445.  Stat cxr repeat ordered now; no respiratory distress reported by RN .  Will follow up.  Algis Greenhouse MD

## 2023-07-21 NOTE — TOC Initial Note (Signed)
 Transition of Care [facility]) - Initial/Assessment Note    Patient Details  Name: Doris Bender MRN: [id number] Date of Birth: Jun 07, 1960  [facility] CM/SW Contact:    Villa Herb, LCSWA Phone Number: 07/21/2023, 11:10 AM  Clinical Narrative:                 Pt is high risk for readmission. CSW spoke with pt to complete assessment. Pt lives alone. Pt is able to complete her ADLs independently. Pt states that she is normally able to drive but if not her son or mother are able to assist. Pt has not had HH. Pt states that due to being weak recently she has been using a walker to ambulate. TOC to follow.   Expected Discharge Plan: Home/Self Care Barriers to Discharge: Continued Medical Work up   Patient Goals and CMS Choice Patient states their goals for this hospitalization and ongoing recovery are:: return home CMS Medicare.gov Compare Post Acute Care list provided to:: Patient Choice offered to / list presented to : Patient      Expected Discharge Plan and Services In-house Referral: Clinical Social Work Discharge Planning Services: CM Consult   Living arrangements for the past 2 months: Single Family Home                                      Prior Living Arrangements/Services Living arrangements for the past 2 months: Single Family Home Lives with:: Self Patient language and need for interpreter reviewed:: Yes Do you feel safe going back to the place where you live?: Yes      Need for Family Participation in Patient Care: Yes (Comment) Care giver support system in place?: Yes (comment) Current home services: DME Criminal Activity/Legal Involvement Pertinent to Current Situation/Hospitalization: No - Comment as needed  Activities of Daily Living      Permission Sought/Granted                  Emotional Assessment Appearance:: Appears stated age Attitude/Demeanor/Rapport: Engaged Affect (typically observed): Accepting Orientation: :  Oriented to Self, Oriented to Place, Oriented to  Time, Oriented to Situation Alcohol / Substance Use: Not Applicable Psych Involvement: No (comment)  Admission diagnosis:  Failure to thrive (child) [R62.51] Failure to thrive in adult [R62.7] Patient Active Problem List   Diagnosis Date Noted   Failure to thrive (child) 07/20/2023   Hypokalemia 07/20/2023   Normocytic anemia 07/20/2023   GERD (gastroesophageal reflux disease) 07/20/2023   Anxiety 07/20/2023   Erosive gastritis 07/15/2023   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   Other dental procedure status 06/23/2023   Esophageal dilatation 06/15/2023   COPD (chronic obstructive pulmonary disease) (HCC) 09/11/2019   Smoker 09/24/2017   Primary osteoarthritis of both hands 09/24/2017   Autoimmune disease (HCC) 09/24/2017   History of osteoporosis 09/13/2017   History of vertebral compression fracture 09/13/2017   Raynaud's syndrome without gangrene 09/13/2017   Pseudogout 09/13/2017   History of migraine 09/13/2017   Left lateral epicondylitis 07/24/2015   Back pain 07/24/2013   Compression fracture of L4 vertebra (HCC) 07/24/2013   ANA positive 05/31/2013   Osteoporosis 05/31/2013   Raynaud phenomenon 05/31/2013   Breast cancer (HCC) 05/30/2013   Cancer (HCC) 05/04/2013   Breast cancer of upper-outer quadrant of left female breast (HCC) 10/18/2012   PCP:  Richardean Chimera, MD Pharmacy:   [facility] [phone] - [place],  [place] - [address] AT [facility] [address] [place] [address] Phone: [phone] Fax: [phone]     Social Drivers of Health (SDOH) Social History: SDOH Screenings   Food Insecurity: No Food Insecurity (06/16/2023)  Housing: Low Risk  (06/16/2023)  Recent Concern: Housing - High Risk (06/16/2023)  Transportation Needs: No Transportation Needs (06/16/2023)  Utilities: Not At Risk (06/16/2023)  Physical Activity: Insufficiently Active (04/12/2023)    Received from [facility]  Social Connections: Unknown (09/14/2021)   Received from [facility], [facility]  Stress: No Stress Concern Present (11/26/2020)   [facility] from [facility], [facility]  Tobacco Use: Medium Risk (07/21/2023)  Health Literacy: Low Risk  (11/26/2020)   Received from [facility], [facility]   SDOH Interventions:     Readmission Risk Interventions    07/21/2023   11:09 AM  Readmission Risk Prevention Plan  Transportation Screening Complete  HRI or Home Care Consult Complete  Social Work Consult for Recovery Care Planning/Counseling Complete  Palliative Care Screening Not Applicable  Medication Review Oceanographer) Complete

## 2023-07-21 NOTE — Anesthesia Preprocedure Evaluation (Addendum)
 Anesthesia Evaluation  Patient identified by MRN, date of birth, ID band Patient awake    Reviewed: Allergy & Precautions, NPO status , Patient's Chart, lab work & pertinent test results  Airway Mallampati: II  TM Distance: >3 FB Neck ROM: Full    Dental  (+) Edentulous Upper, Edentulous Lower   Pulmonary COPD, former smoker   Pulmonary exam normal breath sounds clear to auscultation       Cardiovascular negative cardio ROS Normal cardiovascular exam Rhythm:Regular Rate:Normal     Neuro/Psych Raynaud's negative neurological ROS  negative psych ROS   GI/Hepatic Neg liver ROS, PUD,GERD  ,,GEJ tumor    Endo/Other  negative endocrine ROS    Renal/GU negative Renal ROS  negative genitourinary   Musculoskeletal  (+) Arthritis , Osteoarthritis,    Abdominal   Peds  Hematology  (+) Blood dyscrasia, anemia Hgb 8.7   Anesthesia Other Findings Scleroderma.  Reproductive/Obstetrics                             Anesthesia Physical Anesthesia Plan  ASA: 3  Anesthesia Plan: General   Post-op Pain Management: Dilaudid IV   Induction:   PONV Risk Score and Plan: 2 and Ondansetron, Dexamethasone, Midazolam and Treatment may vary due to age or medical condition  Airway Management Planned: Oral ETT  Additional Equipment: None  Intra-op Plan:   Post-operative Plan:   Informed Consent: I have reviewed the patients History and Physical, chart, labs and discussed the procedure including the risks, benefits and alternatives for the proposed anesthesia with the patient or authorized representative who has indicated his/her understanding and acceptance.     Dental advisory given  Plan Discussed with: CRNA  Anesthesia Plan Comments:        Anesthesia Quick Evaluation

## 2023-07-21 NOTE — Telephone Encounter (Signed)
 According to chart notes, the patient received a J-tube today. In the 07/16/2023 note, Dr. Meridee Score indicated that the patient could open capsules once the tube was in place. If you wish to proceed with an appeal, clinical documentation must be provided explaining why the patient is unable to take or tolerate at least FOUR of the preferred formulary alternatives (esomeprazole, pantoprazole suspension (granules), lansoprazole oral disintegrating tablets, and omeprazole DR capsules). Please advise on how you would like to move forward.  Thank you, Dellie Burns, PharmD Clinical Pharmacist  [place]  Direct Dial: [phone]

## 2023-07-21 NOTE — Op Note (Signed)
 Patient:  Doris Bender  DOB:  1961-02-11  MRN:  [id number]   Preop Diagnosis: Gastroesophageal carcinoma, poor p.o. intake, failure to thrive  Postop Diagnosis: Same  Procedure: Robotic assisted laparoscopic jejunostomy feeding tube placement, Port-A-Cath insertion  Surgeon: Franky Macho, MD  Anes: General Endotracheal  Indications: Patient is a 63 year old white female with gastroesophageal carcinoma who is developing obstructive symptoms and cannot maintain oral intake even with clear liquid fluids.  The patient is about to undergo chemotherapy and surgical evaluation for possible esophagectomy.  The risks and benefits of both procedures including bleeding, infection, pneumothorax, bowel injury, and the possibility of dislodgment of the feeding tube were fully explained to the patient, who gave informed consent.  Procedure note: The patient was placed in the supine position.  After induction of general endotracheal anesthesia, the left upper chest was prepped and draped using usual sterile technique with ChloraPrep.  Surgical site confirmation was performed.  An incision was made below the left clavicle.  Subcutaneous pocket was formed.  A needle was advanced into the left subclavian vein using the Seldinger technique without difficulty.  The guidewire was then advanced under fluoroscopic guidance.  Introducer and peel-away sheath were placed over the guidewire.  The guidewire was removed and the catheter was inserted through the peel-away sheath.  Peel-away sheath was removed.  The catheter was then attached to the port and the port placed in subcutaneous pocket.  Adequate positioning was confirmed by fluoroscopy.  Good backflow of venous blood was noted on aspiration of the port.  The port was flushed with heparin flush.  The subcutaneous layer was reapproximated using a 3-0 Vicryl interrupted suture.  The skin was closed using a 4-0 Monocryl subcuticular suture.  Dermabond was  applied.  Next, I proceeded with the jejunostomy tube placement.  The abdomen was prepped and draped using the usual sterile technique with ChloraPrep.  Surgical site confirmation was performed.  An incision was made in the left upper quadrant at Palmer's point.  A Veress needle was introduced into the abdominal cavity and confirmation of placement was done using the saline drop test.  The abdomen was then insufflated to 15 mmHg pressure.  An 8 mm trocar was placed in the right mid abdomen under direct visualization without difficulty.  Additional 8 mm trocars were placed in the right upper quadrant and right lower quadrant regions.  I also inserted the jejunostomy tube in the mid left abdomen.  The robot was then docked and targeted.  The small bowel was identified and the ligament of Treitz was found.  Approximately 30 cm from the ligament of Treitz, an incision was made into the jejunum.  The jejunostomy tube was then inserted and fed distally until the 10 cc balloon was within the lumen of the jejunum.  Two 3-0 Vicryl pursestring sutures were placed around the opening of the jejunum.  A Witzel procedure was then performed using a 3-0 stratafix running suture.  Once the [facility] had been performed, the small bowel was then tacked to the anterior abdominal wall using a 3-0 stratafix running suture.  The 10 cc balloon was inflated with water.  60 cc of water was then instilled into the jejunostomy tube and no leakage was noted.  The robot was undocked.  All air was evacuated from the abdominal cavity prior to the removal of the trocars.  All wounds were irrigated with normal saline.  All wounds were injected with 0.5% Sensorcaine.  All incisions were closed using a 4-0  Monocryl subcuticular suture.  Dermabond was applied.  A 3-0 nylon was used to fixate the bolster of the jejunostomy tube to the skin.  All tape and needle counts were correct at the end of the procedure.  The patient was extubated in the  operating room and transferred to PACU in stable condition.    Complications: None  EBL: Minimal  Specimen: None

## 2023-07-21 NOTE — Progress Notes (Signed)
 Dr. Henreitta Leber made aware of patients CXR results, new orders placed, telemetry monitoring started.

## 2023-07-21 NOTE — Telephone Encounter (Signed)
 [place] [employer] Cancer Claim completed, emailed to [facility]. with note for patient to connect with [facility] Patient Accounting [phone]).  Page (1) one of claim reads to include copies of Itemized bills for surgeon, medical imaging, radiation/chemotherapy, hospital, etc. are required.  Returned today signed by medical oncologist.  Successfully returned  to [facility] via fax [phone]) with pathology report [phone].  Copy to [facility] bin designated for items to be scanned.  Original prepared to be mailed to patient address on file. [address] [place] [address]  No further instructions received or actions performed by this nurse completes process.

## 2023-07-21 NOTE — Anesthesia Procedure Notes (Signed)
 Procedure Name: Intubation Date/Time: 07/21/2023 11:54 AM  Performed by: Lorin Glass, CRNAPre-anesthesia Checklist: Patient identified, Emergency Drugs available, Suction available and Patient being monitored Patient Re-evaluated:Patient Re-evaluated prior to induction Oxygen Delivery Method: Circle system utilized Preoxygenation: Pre-oxygenation with 100% oxygen Induction Type: IV induction Ventilation: Mask ventilation without difficulty Laryngoscope Size: Mac and 3 Grade View: Grade I Tube type: Oral Tube size: 7.0 mm Number of attempts: 1 Airway Equipment and Method: Stylet Placement Confirmation: ETT inserted through vocal cords under direct vision, positive ETCO2 and breath sounds checked- equal and bilateral Secured at: 20 cm Tube secured with: Tape Dental Injury: Teeth and Oropharynx as per pre-operative assessment  Comments: Intubation performed by Aileen Fass, MD Student

## 2023-07-21 NOTE — Progress Notes (Signed)
 [facility]  Chest x-ray repeated an area is stable to improved.  We'll get a chest x-ray in the a.m.  Will put on cardiac monitor overnight and RN will call with issues  Algis Greenhouse MD

## 2023-07-21 NOTE — Anesthesia Postprocedure Evaluation (Signed)
 Anesthesia Post Note  Patient: Doris Bender  Procedure(s) Performed: INSERTION, JEJUNOSTOMY TUBE, ROBOT-ASSISTED (Left: Abdomen) INSERTION, TUNNELED CENTRAL VENOUS DEVICE, WITH PORT (Left: Chest)  Patient location during evaluation: PACU Anesthesia Type: General Level of consciousness: awake and alert Pain management: pain level controlled Vital Signs Assessment: post-procedure vital signs reviewed and stable Respiratory status: spontaneous breathing, nonlabored ventilation, respiratory function stable and patient connected to nasal cannula oxygen Cardiovascular status: blood pressure returned to baseline and stable Postop Assessment: no apparent nausea or vomiting Anesthetic complications: no   There were no known notable events for this encounter.   Last Vitals:  Vitals:   07/21/23 1445 07/21/23 1500  BP: 122/77 123/72  Pulse:    Resp: (!) 26 (!) 30  Temp:    SpO2: 93% 94%    Last Pain:  Vitals:   07/21/23 1500  TempSrc:   PainSc: Asleep                 [name]

## 2023-07-21 NOTE — Interval H&P Note (Signed)
 History and Physical Interval Note:  07/21/2023 11:04 AM  Doris Bender  has presented today for surgery, with the diagnosis of GE JUNCTION CARCINOMA.  The various methods of treatment have been discussed with the patient and family. After consideration of risks, benefits and other options for treatment, the patient has consented to  Procedure(s): INSERTION, JEJUNOSTOMY TUBE, ROBOT-ASSISTED (N/A) INSERTION, TUNNELED CENTRAL VENOUS DEVICE, WITH PORT (N/A) as a surgical intervention.  The patient's history has been reviewed, patient examined, no change in status, stable for surgery.  I have reviewed the patient's chart and labs.  Questions were answered to the patient's satisfaction.     Franky Macho

## 2023-07-21 NOTE — Telephone Encounter (Signed)
 Pharmacy Patient Advocate Encounter  Information has been sent to clinical pharmacist for appeals review. It may take 5-7 days to prepare the necessary documentation to request the appeal from the insurance.

## 2023-07-21 NOTE — Plan of Care (Signed)
   Problem: Education: Goal: Knowledge of General Education information will improve Description Including pain rating scale, medication(s)/side effects and non-pharmacologic comfort measures Outcome: Progressing   Problem: Health Behavior/Discharge Planning: Goal: Ability to manage health-related needs will improve Outcome: Progressing

## 2023-07-21 NOTE — Transfer of Care (Signed)
 Immediate Anesthesia Transfer of Care Note  Patient: Doris Bender  Procedure(s) Performed: INSERTION, JEJUNOSTOMY TUBE, ROBOT-ASSISTED (Left: Abdomen) INSERTION, TUNNELED CENTRAL VENOUS DEVICE, WITH PORT (Left: Chest)  Patient Location: PACU  Anesthesia Type:General  Level of Consciousness: awake  Airway & Oxygen Therapy: Patient Spontanous Breathing  Post-op Assessment: Report given to RN  Post vital signs: Reviewed and stable  Last Vitals:  Vitals Value Taken Time  BP 118/74 07/21/23 1430  Temp 36.4 C 07/21/23 1430  Pulse 52 07/21/23 1431  Resp 17 07/21/23 1431  SpO2 80 % 07/21/23 1431  Vitals shown include unfiled device data.  Last Pain:  Vitals:   07/21/23 0830  TempSrc: Oral  PainSc: 0-No pain         Complications: No notable events documented.

## 2023-07-22 ENCOUNTER — Inpatient Hospital Stay

## 2023-07-22 ENCOUNTER — Inpatient Hospital Stay: Admitting: Dietician

## 2023-07-22 ENCOUNTER — Ambulatory Visit: Admitting: Dietician

## 2023-07-22 ENCOUNTER — Inpatient Hospital Stay: Admitting: Oncology

## 2023-07-22 ENCOUNTER — Inpatient Hospital Stay (HOSPITAL_COMMUNITY)

## 2023-07-22 ENCOUNTER — Encounter (HOSPITAL_COMMUNITY): Payer: Self-pay | Admitting: General Surgery

## 2023-07-22 DIAGNOSIS — C160 Malignant neoplasm of cardia: Secondary | ICD-10-CM

## 2023-07-22 LAB — GLUCOSE, CAPILLARY
Glucose-Capillary: 120 mg/dL — ABNORMAL HIGH (ref 70–99)
Glucose-Capillary: 129 mg/dL — ABNORMAL HIGH (ref 70–99)
Glucose-Capillary: 153 mg/dL — ABNORMAL HIGH (ref 70–99)
Glucose-Capillary: 165 mg/dL — ABNORMAL HIGH (ref 70–99)
Glucose-Capillary: 193 mg/dL — ABNORMAL HIGH (ref 70–99)
Glucose-Capillary: 199 mg/dL — ABNORMAL HIGH (ref 70–99)

## 2023-07-22 LAB — COMPREHENSIVE METABOLIC PANEL
ALT: 13 U/L (ref 0–44)
AST: 13 U/L — ABNORMAL LOW (ref 15–41)
Albumin: 3 g/dL — ABNORMAL LOW (ref 3.5–5.0)
Alkaline Phosphatase: 61 U/L (ref 38–126)
Anion gap: 12 (ref 5–15)
BUN: 6 mg/dL — ABNORMAL LOW (ref 8–23)
CO2: 24 mmol/L (ref 22–32)
Calcium: 8.4 mg/dL — ABNORMAL LOW (ref 8.9–10.3)
Chloride: 97 mmol/L — ABNORMAL LOW (ref 98–111)
Creatinine, Ser: 0.53 mg/dL (ref 0.44–1.00)
GFR, Estimated: >60 mL/min (ref >60)
Glucose, Bld: 132 mg/dL — ABNORMAL HIGH (ref 70–99)
Potassium: 3.7 mmol/L (ref 3.5–5.1)
Sodium: 133 mmol/L — ABNORMAL LOW (ref 135–145)
Total Bilirubin: 0.7 mg/dL (ref 0.0–1.2)
Total Protein: 5.7 g/dL — ABNORMAL LOW (ref 6.5–8.1)

## 2023-07-22 LAB — CBC
HCT: 28.2 % — ABNORMAL LOW (ref 36.0–46.0)
Hemoglobin: 9 g/dL — ABNORMAL LOW (ref 12.0–15.0)
MCH: 28.1 pg (ref 26.0–34.0)
MCHC: 31.9 g/dL (ref 30.0–36.0)
MCV: 88.1 fL (ref 80.0–100.0)
Platelets: 346 10*3/uL (ref 150–400)
RBC: 3.2 MIL/uL — ABNORMAL LOW (ref 3.87–5.11)
RDW: 15.1 % (ref 11.5–15.5)
WBC: 8.9 10*3/uL (ref 4.0–10.5)
nRBC: 0 % (ref 0.0–0.2)

## 2023-07-22 LAB — MAGNESIUM: Magnesium: 1.6 mg/dL — ABNORMAL LOW (ref 1.7–2.4)

## 2023-07-22 LAB — PHOSPHORUS: Phosphorus: 3.5 mg/dL (ref 2.5–4.6)

## 2023-07-22 MED ORDER — SCOPOLAMINE 1 MG/3DAYS TD PT72
1.0000 | MEDICATED_PATCH | TRANSDERMAL | Status: DC
  Administered 2023-07-22 – 2023-07-25 (×2): 1.5 mg via TRANSDERMAL
  Filled 2023-07-22 (×2): qty 1

## 2023-07-22 MED ORDER — OSMOLITE 1.2 CAL PO LIQD
1000.0000 mL | ORAL | Status: DC
  Administered 2023-07-22 – 2023-07-24 (×2): 1000 mL
  Filled 2023-07-22 (×8): qty 1000

## 2023-07-22 MED ORDER — PHENOL 1.4 % MT LIQD
1.0000 | OROMUCOSAL | Status: DC | PRN
  Filled 2023-07-22: qty 177

## 2023-07-22 MED ORDER — ENOXAPARIN SODIUM 40 MG/0.4ML IJ SOSY
40.0000 mg | PREFILLED_SYRINGE | INTRAMUSCULAR | Status: DC
  Administered 2023-07-22 – 2023-07-26 (×5): 40 mg via SUBCUTANEOUS
  Filled 2023-07-22 (×5): qty 0.4

## 2023-07-22 MED ORDER — FREE WATER
120.0000 mL | Freq: Four times a day (QID) | Status: DC
  Administered 2023-07-22 – 2023-07-26 (×15): 120 mL

## 2023-07-22 NOTE — Telephone Encounter (Signed)
 Dr Meridee Score, I have spoken with the patient. Doris Bender is very adamant that Doris Bender wants liquid, suspension or similar. Doris Bender reports that anything solid "comes back up." Doris Bender has been taking Konvomep during her hospitalization. How do you want to proceed?

## 2023-07-22 NOTE — Progress Notes (Addendum)
 Initial Nutrition Assessment  DOCUMENTATION CODES:   Not applicable  INTERVENTION:   Tube feeding via J-tube: Osmolite 1.2 (or equivalent) at 40 ml/h, increase by 10 ml every 4 hours to goal rate of 65 ml/h (1560 ml per day). Provides 1872 kcal, 87 gm protein, 1279 ml free water daily. Free water flushes 120 ml every 6 hours for a total of 1759 ml free water daily (TF + flush).  Continue clear liquid diet as tolerated for comfort.  Provided "Pump Tube Feeding Instructions" handout in discharge instructions.   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  GOAL:   Patient will meet greater than or equal to 90% of their needs  MONITOR:   TF tolerance  REASON FOR ASSESSMENT:   Consult Enteral/tube feeding initiation and management  ASSESSMENT:   63 yo female admitted with failure to thrive. PMH includes osteoarthritis, gout, Raynaud's disease, scleroderma, recently diagnosed gastroesophageal carcinoma, osteoporosis, remote breast cancer, former smoker.  S/P J-tube and port placement 3/19. Received MD Consult for TF initiation and management. Osmolite 1.2 has been initiated and is currently infusing at 40 ml/h.   PTA, patient has been unable to hold down any solid food and has recently had difficulty with liquids as well. She has been receiving IV fluids as an outpatient to maintain hydration. She is being evaluated by cardiothoracic surgery for an esophagectomy in the near future. She is being followed by a RD at the [facility].   Weight history reviewed.  07/02/23: 59.6 kg 07/19/23: 54 kg 07/22/23: 57.1 kg  Patient with 4% weight loss within the past 3 weeks, which is concerning, but not significant for the time frame.   Labs reviewed. Na 133, mag 1.6 CBG: [phone]  Medications reviewed and include protonix.  NUTRITION - FOCUSED PHYSICAL  EXAM:  Unable to complete  Diet Order:   Diet Order             Diet clear liquid Room service appropriate? Yes; Fluid consistency: Thin  Diet effective now                   EDUCATION NEEDS:   Education needs have been addressed  Skin:  Skin Assessment: Reviewed RN Assessment  Last BM:  3/18  Height:   Ht Readings from Last 1 Encounters:  07/21/23 5\' 5"  (1.651 m)    Weight:   Wt Readings from Last 1 Encounters:  07/22/23 57.1 kg    Ideal Body Weight:  56.8 kg  BMI:  Body mass index is 20.93 kg/m.  Estimated Nutritional Needs:   Kcal:  1700-1900  Protein:  80-100 gm  Fluid:  1.7-1.9 L   Gabriel Rainwater RD, LDN, CNSC Contact via secure chat. If unavailable, use group chat "RD Inpatient."

## 2023-07-22 NOTE — Discharge Instructions (Addendum)
       Tube feeding formula = Osmolite 1.2 or equivalent Continuous feeding at 65 ml/h x 24 hours per day Total amount of formula per day = 1560 ml = 6.5 cartons per day Water for flushing = 120 ml 4 times per day

## 2023-07-22 NOTE — Progress Notes (Signed)
 1 Day Post-Op  Subjective: Patient has mild incisional pain.  Has not had a bowel movement yet.  Is coughing up phlegm.  No chest pain or shortness of breath noted.  Objective: Vital signs in last 24 hours: Temp:  [97.5 F (36.4 C)-99 F (37.2 C)] 99 F (37.2 C) (03/20 0410) Pulse Rate:  [52-80] 80 (03/20 0410) Resp:  [15-30] 17 (03/20 0410) BP: (103-131)/(68-77) 119/73 (03/20 0410) SpO2:  [93 %-100 %] 96 % (03/20 0410) Weight:  [54 kg-57.6 kg] 57.1 kg (03/20 0528) Last BM Date : 07/20/23  Intake/Output from previous day: 03/19 0701 - 03/20 0700 In: 3127.9 [I.V.:2626.1; NG/GT:381.8; IV Piggyback:100] Out: 20 [Drains:10; Blood:10] Intake/Output this shift: No intake/output data recorded.  General appearance: alert, cooperative, and no distress Resp: clear to auscultation bilaterally Cardio: regular rate and rhythm, S1, S2 normal, no murmur, click, rub or gallop GI: Soft, jejunostomy tube in appropriate position.  Incisions healing well.  Minimal bowel sounds appreciated.  Lab Results:  Recent Labs    07/21/23 0549 07/22/23 0515  WBC 6.5 8.9  HGB 8.7* 9.0*  HCT 27.9* 28.2*  PLT 281 346   BMET Recent Labs    07/21/23 0549 07/22/23 0515  NA 137 133*  K 3.8 3.7  CL 102 97*  CO2 24 24  GLUCOSE 92 132*  BUN 6* 6*  CREATININE 0.57 0.53  CALCIUM 8.3* 8.4*   PT/INR No results for input(s): "LABPROT", "INR" in the last 72 hours.  Studies/Results: DG Chest Port 1 View Result Date: 07/21/2023 CLINICAL DATA:  Pneumothorax. EXAM: PORTABLE CHEST 1 VIEW COMPARISON:  Chest x-ray from same day. FINDINGS: Trace left apical pneumothorax, stable to slightly smaller compared to prior. Otherwise unchanged exam. IMPRESSION: 1. Trace left apical pneumothorax, stable to slightly smaller compared to prior. Electronically Signed   By: Obie Dredge M.D.   On: 07/21/2023 19:46   DG Chest Port 1 View Addendum Date: 07/21/2023 ADDENDUM REPORT: 07/21/2023 19:16 ADDENDUM: Critical  Value/emergent results were called by telephone at the time of interpretation on 07/21/2023 at 7:15 pm to provider Larae Grooms, who verbally acknowledged these results. Electronically Signed   By: Narda Rutherford M.D.   On: 07/21/2023 19:16   Result Date: 07/21/2023 CLINICAL DATA:  Post Port-A-Cath placement. EXAM: PORTABLE CHEST 1 VIEW COMPARISON:  Radiograph in CT 06/15/2023 FINDINGS: New left subclavian chest port tip overlies the lower SVC. There is a small left apical pneumothorax, visualized between the posterior left second and third rib. Stable heart size and mediastinal contours. Emphysema with interstitial coarsening. No significant pleural effusion. IMPRESSION: 1. New left subclavian chest port with tip overlying the lower SVC. 2. Small left apical pneumothorax. 3. Emphysema. Attempt is being made to contact the referring clinician at this time. Report completed prior to communication documentation to expedite results. Electronically Signed: By: Narda Rutherford M.D. On: 07/21/2023 18:54   DG C-Arm 1-60 Min-No Report Result Date: 07/21/2023 Fluoroscopy was utilized by the requesting physician.  No radiographic interpretation.    Anti-infectives: Anti-infectives (From admission, onward)    Start     Dose/Rate Route Frequency Ordered Stop   07/21/23 0900  ceFAZolin (ANCEF) IVPB 2g/100 mL premix        2 g 200 mL/hr over 30 Minutes Intravenous On call to O.R. 07/21/23 0758 07/21/23 1218   07/21/23 0800  ceFAZolin (ANCEF) 2-4 GM/100ML-% IVPB       Note to Pharmacy: Trenton Gammon S: cabinet override      07/21/23 0800 07/21/23 1159  Assessment/Plan: s/p Procedure(s): INSERTION, JEJUNOSTOMY TUBE, ROBOT-ASSISTED INSERTION, TUNNELED CENTRAL VENOUS DEVICE, WITH PORT Impression: Stable on postoperative day 1.  Tolerating advancement of jejunostomy tube feedings.  Chest x-ray this morning shows no worsening of her small left apical pneumothorax.  No medications should be placed  or crushed into the jejunostomy tube.  Arranging home health for jejunostomy tube feedings.  LOS: 2 days    Franky Macho 07/22/2023

## 2023-07-22 NOTE — Progress Notes (Signed)
 Brief Nutrition Note:  Planned to meet with patient for jejunal feeding/pump education. Pt admitted with FTT 3/18. She is s/p Jtube 3/19.  Pt currently admitted. RD attempted to see today while inpatient. Pt sleeping at time of visit. Pt mother present in room. Mother reports multiple episodes of vomiting with po today. Pt unable to tolerate broth, water, or ice pop. Tube feeds running at 40 ml/hr. Mother of pt asking when pt will be able to discharge. Upcoming appointment with [facility] surgical oncology on 3/25.  RD offered supportive listening and encouragement Discussed discharge will be determined by inpatient team Tentative chemotherapy with FLOT start 3/31  Next visit: Monday 3/24 via telephone

## 2023-07-22 NOTE — Telephone Encounter (Signed)
 Since Lansoprazole oral disintegrating tablets is an option we will try this first, because her getting a prior authorization without even trying 1 of the items, will not occur, since they need her to fail 4 of these. Thus do Lansoprazole 15 mg BID (60/3). Further PPI therapy as per her primary GI who diagnosed her malignancy. GM

## 2023-07-22 NOTE — Progress Notes (Signed)
 Met with the patient at bedside to discuss upcoming care plan. Follow-up appts with nutrition, Dr. Anders Simmonds and tentative start date for chemotherapy pending her second opinion appt at [facility]. Patient verbalized understanding.

## 2023-07-23 ENCOUNTER — Other Ambulatory Visit: Payer: Self-pay | Admitting: Oncology

## 2023-07-23 DIAGNOSIS — C160 Malignant neoplasm of cardia: Secondary | ICD-10-CM

## 2023-07-23 LAB — GLUCOSE, CAPILLARY
Glucose-Capillary: 175 mg/dL — ABNORMAL HIGH (ref 70–99)
Glucose-Capillary: 144 mg/dL — ABNORMAL HIGH (ref 70–99)
Glucose-Capillary: 132 mg/dL — ABNORMAL HIGH (ref 70–99)
Glucose-Capillary: 145 mg/dL — ABNORMAL HIGH (ref 70–99)
Glucose-Capillary: 143 mg/dL — ABNORMAL HIGH (ref 70–99)

## 2023-07-23 LAB — CBC
HCT: 29.8 % — ABNORMAL LOW (ref 36.0–46.0)
Hemoglobin: 9.7 g/dL — ABNORMAL LOW (ref 12.0–15.0)
MCH: 28.3 pg (ref 26.0–34.0)
MCHC: 32.6 g/dL (ref 30.0–36.0)
MCV: 86.9 fL (ref 80.0–100.0)
Platelets: 441 10*3/uL — ABNORMAL HIGH (ref 150–400)
RBC: 3.43 MIL/uL — ABNORMAL LOW (ref 3.87–5.11)
RDW: 15 % (ref 11.5–15.5)
WBC: 9.8 10*3/uL (ref 4.0–10.5)
nRBC: 0 % (ref 0.0–0.2)

## 2023-07-23 LAB — COMPREHENSIVE METABOLIC PANEL
ALT: 12 U/L (ref 0–44)
AST: 13 U/L — ABNORMAL LOW (ref 15–41)
Albumin: 3.3 g/dL — ABNORMAL LOW (ref 3.5–5.0)
Alkaline Phosphatase: 64 U/L (ref 38–126)
Anion gap: 10 (ref 5–15)
BUN: 9 mg/dL (ref 8–23)
CO2: 28 mmol/L (ref 22–32)
Calcium: 8 mg/dL — ABNORMAL LOW (ref 8.9–10.3)
Chloride: 94 mmol/L — ABNORMAL LOW (ref 98–111)
Creatinine, Ser: 0.49 mg/dL (ref 0.44–1.00)
GFR, Estimated: >60 mL/min (ref >60)
Glucose, Bld: 158 mg/dL — ABNORMAL HIGH (ref 70–99)
Potassium: 3.3 mmol/L — ABNORMAL LOW (ref 3.5–5.1)
Sodium: 132 mmol/L — ABNORMAL LOW (ref 135–145)
Total Bilirubin: 0.5 mg/dL (ref 0.0–1.2)
Total Protein: 6.3 g/dL — ABNORMAL LOW (ref 6.5–8.1)

## 2023-07-23 LAB — MAGNESIUM: Magnesium: 1.7 mg/dL (ref 1.7–2.4)

## 2023-07-23 LAB — PHOSPHORUS: Phosphorus: 3.1 mg/dL (ref 2.5–4.6)

## 2023-07-23 MED ORDER — BISACODYL 10 MG RE SUPP
10.0000 mg | Freq: Once | RECTAL | Status: AC
  Administered 2023-07-23: 10 mg via RECTAL
  Filled 2023-07-23: qty 1

## 2023-07-23 MED ORDER — METOCLOPRAMIDE HCL 5 MG/ML IJ SOLN
5.0000 mg | Freq: Four times a day (QID) | INTRAMUSCULAR | Status: AC
  Administered 2023-07-23 – 2023-07-26 (×11): 5 mg via INTRAVENOUS
  Filled 2023-07-23 (×12): qty 2

## 2023-07-23 NOTE — Progress Notes (Signed)
 Pt spitting up denies vomitting or nausea. Says it is the lump in her throat and she was told from the doctor that the zofran would not work so she refused to have zofran when offered.  Cool cloth to head and emotional support given.

## 2023-07-23 NOTE — Plan of Care (Signed)

## 2023-07-23 NOTE — Progress Notes (Signed)
 Pharmacist Chemotherapy Monitoring - Initial Assessment    Anticipated start date: 08/02/23   The following has been reviewed per standard work regarding the patient's treatment regimen: The patient's diagnosis, treatment plan and drug doses, and organ/hematologic function Lab orders and baseline tests specific to treatment regimen  The treatment plan start date, drug sequencing, and pre-medications Prior authorization status  Patient's documented medication list, including drug-drug interaction screen and prescriptions for anti-emetics and supportive care specific to the treatment regimen The drug concentrations, fluid compatibility, administration routes, and timing of the medications to be used The patient's access for treatment and lifetime cumulative dose history, if applicable  The patient's medication allergies and previous infusion related reactions, if applicable   Changes made to treatment plan:  treatment plan date Moved Udencya to pump DC day from Day 3.  Ok to give on day 2 as long as >24 hours.  Follow up needed:  Pending authorization for treatment    Stephens Shire, [facility], 07/23/2023  12:46 PM

## 2023-07-23 NOTE — Progress Notes (Signed)
 Pt continues to be nauseaus and vomiting tube feed is off pt agreed to be medicated with zofran zofran iv given

## 2023-07-23 NOTE — Progress Notes (Signed)
 The jejunostomy balloon was deflated to 7cc.

## 2023-07-23 NOTE — Progress Notes (Signed)
 START ON PATHWAY REGIMEN - Gastroesophageal     A cycle is every 14 days:     Docetaxel      Oxaliplatin      Leucovorin      Fluorouracil   **Always confirm dose/schedule in your pharmacy ordering system**  Patient Characteristics: Esophageal & GE Junction, Adenocarcinoma, Preoperative or Nonsurgical Candidate, M0 (Clinical Staging), cT2 or Higher or cN+, Surgical Candidate (Up to cT4a), Esophageal Therapeutic Status: Preoperative or Nonsurgical Candidate, M0 (Clinical Staging) Histology: Adenocarcinoma Disease Classification: Esophageal AJCC Grade: Staged < 8th Ed. AJCC 8 Stage Grouping: IVA AJCC T Category: cT3 AJCC N Category: cN2 AJCC M Category: cM0 Intent of Therapy: Curative Intent, Discussed with Patient

## 2023-07-23 NOTE — Progress Notes (Signed)
 Resting quietly no more vomitting noted.

## 2023-07-23 NOTE — Progress Notes (Signed)
 2 Days Post-Op  Subjective: Patient with nausea and vomiting.  She states she was having this preoperatively.  No tube feed contents are noted in the emesis.  Objective: Vital signs in last 24 hours: Temp:  [98.3 F (36.8 C)-98.4 F (36.9 C)] 98.3 F (36.8 C) (03/21 0531) Pulse Rate:  [87-101] 101 (03/20 2009) Resp:  [14-18] 18 (03/21 0531) BP: (116-132)/(66-79) 127/75 (03/21 0531) SpO2:  [94 %-96 %] 96 % (03/21 0531) Weight:  [56.4 kg] 56.4 kg (03/20 2158) Last BM Date : 07/20/23  Intake/Output from previous day: 03/20 0701 - 03/21 0700 In: 1053.5 [NG/GT:533.5] Out: -  Intake/Output this shift: No intake/output data recorded.  General appearance: alert, cooperative, and fatigued Resp: clear to auscultation bilaterally Cardio: regular rate and rhythm, S1, S2 normal, no murmur, click, rub or gallop GI: Soft, incisions healing well.  Minimal bowel sounds appreciated.  J-tube in appropriate position.  Lab Results:  Recent Labs    07/22/23 0515 07/23/23 0451  WBC 8.9 9.8  HGB 9.0* 9.7*  HCT 28.2* 29.8*  PLT 346 441*   BMET Recent Labs    07/22/23 0515 07/23/23 0451  NA 133* 132*  K 3.7 3.3*  CL 97* 94*  CO2 24 28  GLUCOSE 132* 158*  BUN 6* 9  CREATININE 0.53 0.49  CALCIUM 8.4* 8.0*   PT/INR No results for input(s): "LABPROT", "INR" in the last 72 hours.  Studies/Results: DG Chest Port 1 View Result Date: 07/22/2023 CLINICAL DATA:  Pneumothorax. EXAM: PORTABLE CHEST 1 VIEW COMPARISON:  July 21, 2023. FINDINGS: The heart size and mediastinal contours are within normal limits. Left subclavian Port-A-Cath is unchanged. Stable minimal left apical pneumothorax. Right lung is clear. The visualized skeletal structures are unremarkable. IMPRESSION: Stable minimal left apical pneumothorax. Electronically Signed   By: Lupita Raider M.D.   On: 07/22/2023 10:41   DG Chest Port 1 View Result Date: 07/21/2023 CLINICAL DATA:  Pneumothorax. EXAM: PORTABLE CHEST 1 VIEW  COMPARISON:  Chest x-ray from same day. FINDINGS: Trace left apical pneumothorax, stable to slightly smaller compared to prior. Otherwise unchanged exam. IMPRESSION: 1. Trace left apical pneumothorax, stable to slightly smaller compared to prior. Electronically Signed   By: Obie Dredge M.D.   On: 07/21/2023 19:46   DG Chest Port 1 View Addendum Date: 07/21/2023 ADDENDUM REPORT: 07/21/2023 19:16 ADDENDUM: Critical Value/emergent results were called by telephone at the time of interpretation on 07/21/2023 at 7:15 pm to provider Larae Grooms, who verbally acknowledged these results. Electronically Signed   By: Narda Rutherford M.D.   On: 07/21/2023 19:16   Result Date: 07/21/2023 CLINICAL DATA:  Post Port-A-Cath placement. EXAM: PORTABLE CHEST 1 VIEW COMPARISON:  Radiograph in CT 06/15/2023 FINDINGS: New left subclavian chest port tip overlies the lower SVC. There is a small left apical pneumothorax, visualized between the posterior left second and third rib. Stable heart size and mediastinal contours. Emphysema with interstitial coarsening. No significant pleural effusion. IMPRESSION: 1. New left subclavian chest port with tip overlying the lower SVC. 2. Small left apical pneumothorax. 3. Emphysema. Attempt is being made to contact the referring clinician at this time. Report completed prior to communication documentation to expedite results. Electronically Signed: By: Narda Rutherford M.D. On: 07/21/2023 18:54   DG C-Arm 1-60 Min-No Report Result Date: 07/21/2023 Fluoroscopy was utilized by the requesting physician.  No radiographic interpretation.    Anti-infectives: Anti-infectives (From admission, onward)    Start     Dose/Rate Route Frequency Ordered Stop  07/21/23 0900  ceFAZolin (ANCEF) IVPB 2g/100 mL premix        2 g 200 mL/hr over 30 Minutes Intravenous On call to O.R. 07/21/23 0758 07/21/23 1218   07/21/23 0800  ceFAZolin (ANCEF) 2-4 GM/100ML-% IVPB       Note to Pharmacy:  Trenton Gammon S: cabinet override      07/21/23 0800 07/21/23 1159       Assessment/Plan: s/p Procedure(s): INSERTION, JEJUNOSTOMY TUBE, ROBOT-ASSISTED INSERTION, TUNNELED CENTRAL VENOUS DEVICE, WITH PORT Impression: Nausea and vomiting is secondary to her GE juncture carcinoma.  She was having this preoperatively.  Will restart J-tube feedings at 20 cc an hour.  Will start Reglan IV.  Will give Dulcolax suppository.  LOS: 3 days    Franky Macho 07/23/2023

## 2023-07-23 NOTE — TOC Progression Note (Signed)
 Transition of Care [facility]) - Progression Note    Patient Details  Name: Doris Bender MRN: [id number] Date of Birth: Sep 21, 1960  Transition of Care [facility]) CM/SW Contact  [place], [place] Phone Number: 07/23/2023, 11:58 AM  Clinical Narrative:    CSW spoke to Laie with [employer] who states they are in network and able to supply pts tube feed supplies. Orders have been signed by Dr. Lovell Sheehan and faxed to [facility]. CSW also updated that MD placed HH orders. CSW spoke to [place] with [facility] who accepts [facility] referral at this time. TOC to follow.   Expected Discharge Plan: Home/Self Care Barriers to Discharge: Continued Medical Work up  Expected Discharge Plan and Services In-house Referral: Clinical Social Work Discharge Planning Services: CM Consult   Living arrangements for the past 2 months: Single Family Home                                       Social Determinants of Health (SDOH) Interventions SDOH Screenings   Food Insecurity: No Food Insecurity (07/21/2023)  Housing: Low Risk  (07/21/2023)  Recent Concern: Housing - High Risk (06/16/2023)  Transportation Needs: No Transportation Needs (07/21/2023)  Utilities: Not At Risk (07/21/2023)  Physical Activity: Insufficiently Active (04/12/2023)   Received from [facility]  Social Connections: Moderately Isolated (07/21/2023)  Stress: No Stress Concern Present (11/26/2020)   [facility]  Tobacco Use: Medium Risk (07/21/2023)  Health Literacy: Low Risk  (11/26/2020)   Received from [facility]    Readmission Risk Interventions    07/21/2023   11:09 AM  Readmission Risk Prevention Plan  Transportation Screening Complete  HRI or Home Care Consult Complete  Social Work Consult for Recovery Care Planning/Counseling Complete  Palliative Care Screening Not Applicable  Medication Review Oceanographer) Complete

## 2023-07-23 NOTE — Telephone Encounter (Signed)
 Very reasonable. Thanks. GM

## 2023-07-24 ENCOUNTER — Other Ambulatory Visit: Payer: Self-pay

## 2023-07-24 LAB — GLUCOSE, CAPILLARY
Glucose-Capillary: 137 mg/dL — ABNORMAL HIGH (ref 70–99)
Glucose-Capillary: 139 mg/dL — ABNORMAL HIGH (ref 70–99)
Glucose-Capillary: 142 mg/dL — ABNORMAL HIGH (ref 70–99)
Glucose-Capillary: 153 mg/dL — ABNORMAL HIGH (ref 70–99)
Glucose-Capillary: 153 mg/dL — ABNORMAL HIGH (ref 70–99)
Glucose-Capillary: 178 mg/dL — ABNORMAL HIGH (ref 70–99)

## 2023-07-24 NOTE — Progress Notes (Signed)
 2 witnessed episodes of vomiting during the shift. Emesis is brown in color. Patient does state she has not vomited as much during the night than previous nights.

## 2023-07-24 NOTE — Plan of Care (Signed)
 Patient with nausea and vomiting at the beginning of the shift. Flat affect. Not feeling well.  Problem: Education: Goal: Knowledge of General Education information will improve Description: Including pain rating scale, medication(s)/side effects and non-pharmacologic comfort measures Outcome: Progressing   Problem: Clinical Measurements: Goal: Ability to maintain clinical measurements within normal limits will improve Outcome: Progressing Goal: Will remain free from infection Outcome: Progressing Goal: Diagnostic test results will improve Outcome: Progressing Goal: Respiratory complications will improve Outcome: Progressing Goal: Cardiovascular complication will be avoided Outcome: Progressing   Problem: Activity: Goal: Risk for activity intolerance will decrease Outcome: Progressing   Problem: Coping: Goal: Level of anxiety will decrease Outcome: Progressing   Problem: Elimination: Goal: Will not experience complications related to bowel motility Outcome: Progressing Goal: Will not experience complications related to urinary retention Outcome: Progressing   Problem: Pain Managment: Goal: General experience of comfort will improve and/or be controlled Outcome: Progressing   Problem: Safety: Goal: Ability to remain free from injury will improve Outcome: Progressing   Problem: Skin Integrity: Goal: Risk for impaired skin integrity will decrease Outcome: Progressing   Problem: Health Behavior/Discharge Planning: Goal: Ability to manage health-related needs will improve Outcome: Not Progressing   Problem: Nutrition: Goal: Adequate nutrition will be maintained Outcome: Not Progressing

## 2023-07-24 NOTE — Plan of Care (Signed)

## 2023-07-24 NOTE — Progress Notes (Addendum)
 [facility]  Still with vomiting/ spitting up. Says she is not really nauseated exactly.   J tube feeds at 20, have not been increased.  Vomit is coffee ground.  Had a BM.   BP 134/76   Pulse 88   Temp 98.5 F (36.9 C) (Oral)   Resp 19   Ht 5\' 5"  (1.651 m)   Wt 54.1 kg   SpO2 95%   BMI 19.85 kg/m  Soft, nondisended, appropriately tender, J tube in with no erythema   Patient s/p J tube placement for feeds, GE junction esophageal cancer.  I worry the vomiting is from the cancer getting more obstructive and not related to the feeds at all. Discussed this with her.   J tube feeds, go up 10cc every 4 hours. Discussed with RN PRN for pain No labwork today, will not repeat any unless having any fevers or issues  Patient hopes to discharged before Appt at [facility] Tuesday for a Second opinion.  Algis Greenhouse, MD [facility] [address] [place], [place] [address] [phone] (office)

## 2023-07-24 NOTE — Progress Notes (Signed)
 Zero residual

## 2023-07-25 LAB — GLUCOSE, CAPILLARY
Glucose-Capillary: 122 mg/dL — ABNORMAL HIGH (ref 70–99)
Glucose-Capillary: 131 mg/dL — ABNORMAL HIGH (ref 70–99)
Glucose-Capillary: 137 mg/dL — ABNORMAL HIGH (ref 70–99)
Glucose-Capillary: 138 mg/dL — ABNORMAL HIGH (ref 70–99)
Glucose-Capillary: 154 mg/dL — ABNORMAL HIGH (ref 70–99)

## 2023-07-25 NOTE — Progress Notes (Signed)
 Feedings back up to 35ml/hr

## 2023-07-25 NOTE — Plan of Care (Signed)

## 2023-07-25 NOTE — Progress Notes (Signed)
 [facility]  Had nausea and vomiting again this Am. Thinks it is related to oral intake. Feeds at 55 and tolerating now, they did get up to 65 but she had issues and they backed it down.   Will get RN to try to go up 5cc now and then 5cc later in the afternoon.  BP 120/78   Pulse 91   Temp 98.2 F (36.8 C) (Oral)   Resp 19   Ht 5\' 5"  (1.651 m)   Wt 54.7 kg   SpO2 92%   BMI 20.07 kg/m  Resting in bed Jejunostomy tube in place, dressing   Patient s/p robotic J tube placement for feeds, GE junction esophageal cancer.  I worry the vomiting is from the cancer getting more obstructive and not related to the feeds at all. Discussed this with her and she is agreeing.    J tube feeds, go up 55 cc will try to get her to goal today, slowly  Discussed with RN PRN for pain  SW is not sure if they material is at there home, patient reports she is suppose to call them the  day she dcs. To be safe I think it is probably better to wait and dc tomorrow.    Patient hopes to discharged before Appt at [facility] Tuesday for a Second opinion  Algis Greenhouse, MD [facility] [address] [place], [place] [address] [phone] (office) .

## 2023-07-26 ENCOUNTER — Inpatient Hospital Stay (HOSPITAL_COMMUNITY)

## 2023-07-26 ENCOUNTER — Other Ambulatory Visit: Payer: Self-pay

## 2023-07-26 ENCOUNTER — Inpatient Hospital Stay: Admitting: Dietician

## 2023-07-26 DIAGNOSIS — C160 Malignant neoplasm of cardia: Secondary | ICD-10-CM | POA: Diagnosis not present

## 2023-07-26 DIAGNOSIS — M349 Systemic sclerosis, unspecified: Secondary | ICD-10-CM | POA: Diagnosis not present

## 2023-07-26 DIAGNOSIS — J95811 Postprocedural pneumothorax: Secondary | ICD-10-CM | POA: Diagnosis not present

## 2023-07-26 DIAGNOSIS — R092 Respiratory arrest: Secondary | ICD-10-CM | POA: Diagnosis not present

## 2023-07-26 DIAGNOSIS — R6251 Failure to thrive (child): Secondary | ICD-10-CM

## 2023-07-26 LAB — COMPREHENSIVE METABOLIC PANEL
ALT: 95 U/L — ABNORMAL HIGH (ref 0–44)
AST: 81 U/L — ABNORMAL HIGH (ref 15–41)
Albumin: 3.7 g/dL (ref 3.5–5.0)
Alkaline Phosphatase: 102 U/L (ref 38–126)
Anion gap: 27 — ABNORMAL HIGH (ref 5–15)
BUN: 42 mg/dL — ABNORMAL HIGH (ref 8–23)
CO2: 19 mmol/L — ABNORMAL LOW (ref 22–32)
Calcium: 10.3 mg/dL (ref 8.9–10.3)
Chloride: 88 mmol/L — ABNORMAL LOW (ref 98–111)
Creatinine, Ser: 2.2 mg/dL — ABNORMAL HIGH (ref 0.44–1.00)
GFR, Estimated: 24 mL/min — ABNORMAL LOW (ref >60)
Glucose, Bld: 421 mg/dL — ABNORMAL HIGH (ref 70–99)
Potassium: 4.8 mmol/L (ref 3.5–5.1)
Sodium: 134 mmol/L — ABNORMAL LOW (ref 135–145)
Total Bilirubin: 1.2 mg/dL (ref 0.0–1.2)
Total Protein: 7.3 g/dL (ref 6.5–8.1)

## 2023-07-26 LAB — GLUCOSE, CAPILLARY
Glucose-Capillary: 164 mg/dL — ABNORMAL HIGH (ref 70–99)
Glucose-Capillary: 153 mg/dL — ABNORMAL HIGH (ref 70–99)

## 2023-07-26 LAB — URINALYSIS, W/ REFLEX TO CULTURE (INFECTION SUSPECTED)
Bacteria, UA: NONE SEEN
Bilirubin Urine: NEGATIVE
Glucose, UA: NEGATIVE mg/dL
Hgb urine dipstick: NEGATIVE
Ketones, ur: NEGATIVE mg/dL
Leukocytes,Ua: NEGATIVE
Nitrite: NEGATIVE
Protein, ur: 100 mg/dL — AB
Specific Gravity, Urine: 1.027 (ref 1.005–1.030)
pH: 5 (ref 5.0–8.0)

## 2023-07-26 LAB — TROPONIN I (HIGH SENSITIVITY): Troponin I (High Sensitivity): 93 ng/L — ABNORMAL HIGH (ref <18)

## 2023-07-26 LAB — CBC WITH DIFFERENTIAL/PLATELET
Abs Immature Granulocytes: 0.65 10*3/uL — ABNORMAL HIGH (ref 0.00–0.07)
Basophils Absolute: 0.1 10*3/uL (ref 0.0–0.1)
Basophils Relative: 0 %
Eosinophils Absolute: 0 10*3/uL (ref 0.0–0.5)
Eosinophils Relative: 0 %
HCT: 39.8 % (ref 36.0–46.0)
Hemoglobin: 11.7 g/dL — ABNORMAL LOW (ref 12.0–15.0)
Immature Granulocytes: 3 %
Lymphocytes Relative: 8 %
Lymphs Abs: 1.7 10*3/uL (ref 0.7–4.0)
MCH: 28.2 pg (ref 26.0–34.0)
MCHC: 29.4 g/dL — ABNORMAL LOW (ref 30.0–36.0)
MCV: 95.9 fL (ref 80.0–100.0)
Monocytes Absolute: 1 10*3/uL (ref 0.1–1.0)
Monocytes Relative: 5 %
Neutro Abs: 18.5 10*3/uL — ABNORMAL HIGH (ref 1.7–7.7)
Neutrophils Relative %: 84 %
Platelets: 739 10*3/uL — ABNORMAL HIGH (ref 150–400)
RBC: 4.15 MIL/uL (ref 3.87–5.11)
RDW: 16.2 % — ABNORMAL HIGH (ref 11.5–15.5)
WBC: 21.9 10*3/uL — ABNORMAL HIGH (ref 4.0–10.5)
nRBC: 0.1 % (ref 0.0–0.2)

## 2023-07-26 LAB — MRSA NEXT GEN BY PCR, NASAL: MRSA by PCR Next Gen: NOT DETECTED

## 2023-07-26 LAB — PHOSPHORUS: Phosphorus: 10.1 mg/dL — ABNORMAL HIGH (ref 2.5–4.6)

## 2023-07-26 LAB — LACTIC ACID, PLASMA: Lactic Acid, Venous: >9 mmol/L (ref 0.5–1.9)

## 2023-07-26 LAB — MAGNESIUM: Magnesium: 3.6 mg/dL — ABNORMAL HIGH (ref 1.7–2.4)

## 2023-07-26 MED ORDER — BISACODYL 10 MG RE SUPP
10.0000 mg | Freq: Once | RECTAL | Status: AC
  Administered 2023-07-26: 10 mg via RECTAL
  Filled 2023-07-26: qty 1

## 2023-07-26 MED ORDER — ENOXAPARIN SODIUM 30 MG/0.3ML IJ SOSY: 30.0000 mg | PREFILLED_SYRINGE | INTRAMUSCULAR | Status: DC

## 2023-07-26 MED ORDER — MILK AND MOLASSES ENEMA: 1.0000 | Freq: Once | RECTAL | Status: DC

## 2023-07-26 MED ORDER — CHLORHEXIDINE GLUCONATE CLOTH 2 % EX PADS: 6.0000 | MEDICATED_PAD | Freq: Every day | CUTANEOUS | Status: DC

## 2023-07-28 ENCOUNTER — Inpatient Hospital Stay

## 2023-07-28 ENCOUNTER — Other Ambulatory Visit

## 2023-07-28 ENCOUNTER — Inpatient Hospital Stay: Admitting: Oncology

## 2023-07-28 ENCOUNTER — Inpatient Hospital Stay: Admitting: Licensed Clinical Social Worker

## 2023-07-29 ENCOUNTER — Inpatient Hospital Stay: Admitting: Oncology

## 2023-07-29 ENCOUNTER — Inpatient Hospital Stay

## 2023-08-02 ENCOUNTER — Inpatient Hospital Stay

## 2023-08-02 ENCOUNTER — Inpatient Hospital Stay: Admitting: Oncology

## 2023-08-02 ENCOUNTER — Inpatient Hospital Stay: Admitting: Dietician

## 2023-08-03 ENCOUNTER — Inpatient Hospital Stay

## 2023-08-03 NOTE — Progress Notes (Signed)
 [facility]  In room with patient, Dr. Lovell Sheehan walked in and at same time patient became unresponsive. Dilated pupils, did not respond to sterile rub. HR came down to the 40s. Atropine.  Intubation started. Dr. Lovell Sheehan intubated with 7.0 ET tube. During this time patient did lose pulse. And received chest compressions and 1 round epi. Did not require any shocks. Had ROSC within like 4 minutes.   Dr. Lovell Sheehan going to update the family with this new informtain.  Given the respiratory arrest, concern for aspiration still. Given the right arm numbness could have also had a stroke.  Will get Stat CT code stroke and CT c/a/p to further assess. CXR post intubation.   Lactic added to prior labs.   Algis Greenhouse, MD [facility] [address] [place], [place] [address] [phone] (office)

## 2023-08-03 NOTE — Progress Notes (Signed)
 Late Entry: 0821 sent message to MD informing him that patient is complaining of abdominal pain stating "I fell like I'm gonna explode" Around 0840 physician on unit rounding on patient.

## 2023-08-03 NOTE — Progress Notes (Signed)
 [facility]  Updated family trying to sort out what is going on and Dr. Lovell Sheehan will be up once not in the OR. Algis Greenhouse, MD [facility] [address] [place], [place] [address] [phone] (office)

## 2023-08-03 NOTE — Progress Notes (Signed)
 Mobility Specialist Progress Note:    08/01/2023 1015  Mobility  Activity Dangled on edge of bed  Level of Assistance Contact guard assist, steadying assist  Assistive Device None  Range of Motion/Exercises Active;All extremities  Activity Response Tolerated well;RN notified  Mobility Referral No  Mobility visit 1 Mobility  Mobility Specialist Start Time (ACUTE ONLY) 1000  Mobility Specialist Stop Time (ACUTE ONLY) 1015  Mobility Specialist Time Calculation (min) (ACUTE ONLY) 15 min   Pt received requesting assistance to bathroom. Required CGA to dangle EOB. Pt stated "I feel like I am going to pass out" while sitting EOB. Deferred ambulation, returned supine. Notified nursing staff, left pt supine with family and RN. All needs met.   Lawerance Bach Mobility Specialist Please contact via Special educational needs teacher or  Rehab office at [phone]

## 2023-08-03 NOTE — Progress Notes (Signed)
   07/10/2023 1304  Spiritual Encounters  Type of Visit Initial  Care provided to: Patient;Family  Conversation partners present during encounter Nurse;Physician  Referral source Code page  Reason for visit Urgent spiritual support  OnCall Visit No  Spiritual Framework  Presenting Themes Impactful experiences and emotions;Values and beliefs;Significant life change  Community/Connection Family  Patient Stress Factors None identified;Health changes;Major life changes  Family Stress Factors Health changes  Interventions  Spiritual Care Interventions Made Compassionate presence;Reflective listening;Normalization of emotions;Prayer  Intervention Outcomes  Outcomes Awareness around self/spiritual resourses;Awareness of support;Reduced fear;Reduced anxiety;Connection to spiritual care  Spiritual Care Plan  Spiritual Care Issues Still Outstanding Chaplain will continue to follow   Alerted by Rapid Response page overhead in hospital. Arrived and found patient awake and alert. Spoke with Hospitalist who shared that he was sending her to ICU for observation due to rapid response. Provided support to patient mother, niece, and son who were present bedside. Provided support to them as patient was transferred to ICU. Will continue to provided support and to assess for spiritual need.   Rev. Jolyn Lent, M.Div [phone]

## 2023-08-03 NOTE — Death Summary Note (Signed)
 DEATH SUMMARY   Patient Details  Name: Doris Bender MRN: [id number] DOB: 1960-11-23  Admission/Discharge Information   Admit Date:  2023/07/21  Date of Death:    Time of Death:    Length of Stay: 6  Referring Physician: Richardean Chimera, MD   Reason(s) for Hospitalization  Gastroesophageal carcinoma, failure to thrive, inability to maintain oral intake  Diagnoses  Preliminary cause of death: Respiratory arrest (HCC) Secondary Diagnoses (including complications and co-morbidities):  Principal Problem:   Failure to thrive (child) Active Problems:   Hypokalemia   Normocytic anemia   GERD (gastroesophageal reflux disease)   Anxiety   Carcinoma of gastroesophageal junction [facility])   Brief Hospital Course (including significant findings, care, treatment, and services provided and events leading to death)  Doris Bender is a 63 y.o. year old female who was admitted to [facility] on 2023/07/21 with worsening nausea and vomiting secondary to near obstruction from her gastroesophageal carcinoma.  The following day, she underwent a Port-A-Cath placement and robotic assisted laparoscopic jejunostomy tube placement.  She tolerated the surgery well.  Her postoperative course was remarkable for ongoing nausea and vomiting.  She was trying to maintain oral intake but continued to have nausea.  She initially had left apical pneumo thorax but this resolved without intervention.  Her tube feeds were being advanced but she continued to have abdominal bloating and nausea.  On 07/08/2023, she was noted to have abdominal distention.  Chest x-ray was unremarkable.  KUB showed significant stool burden.  She started to have shortness of breath and was transferred to the stepdown unit.  While in the stepdown unit, she suffered a respiratory arrest.  At intubation, she was noted to have gastric contents within her lungs consistent with aspiration.  Despite multiple rounds of epinephrine, Levophed,  and CPR, she expired at 12:36 PM.  Family was in the hospital and were made aware.  Dr. Santiago Bumpers of the medical examiner's office was contacted and was satisfied that this was a natural death.    Pertinent Labs and Studies  Significant Diagnostic Studies DG Chest Port 1 View Result Date: 07/08/2023 CLINICAL DATA:  63 year old female with shortness of breath and abdominal distension. Cancer. Status post jejunostomy tube placement postoperative day 5. Intubated now. EXAM: PORTABLE CHEST 1 VIEW COMPARISON:  Portable chest 1114 hours today. FINDINGS: Portable AP supine view at 1156 hours. Endotracheal tube placed, tip in good position between the level the clavicles and carina. Mildly larger lung volumes. No pneumothorax or pleural effusion identified on this supine view. No confluent lung opacity. Stable cardiac size and mediastinal contours. Stable left chest power port. IMPRESSION: Intubated with satisfactory endotracheal tube. No acute cardiopulmonary abnormality. Electronically Signed   By: Odessa Fleming M.D.   On: 07/13/2023 12:47   DG Abd 1 View Result Date: 07/03/2023 CLINICAL DATA:  63 year old female with shortness of breath and abdominal distension. Cancer. Status post jejunostomy tube placement postoperative day 5. EXAM: ABDOMEN - 1 VIEW COMPARISON:  0920 hours today. FINDINGS: Portable AP supine view at 1116 hours. No significant change in left side jejunostomy tube, gas distended small bowel in the central abdomen and retained stool otherwise. Stable visualized osseous structures. IMPRESSION: Stable bowel gas pattern from 0920 hours with gas distended small bowel in the central abdomen, abundant superimposed large bowel retained stool. Underlying left abdominal jejunostomy. Differential considerations remain small-bowel ileus versus mechanical obstruction. Electronically Signed   By: Odessa Fleming M.D.   On: 07/11/2023 12:46  DG Abd 1 View Result Date: 08/01/2023 CLINICAL DATA:  63 year old female  with shortness of breath and abdominal distension. Cancer. Status post jejunostomy tube placement postoperative day 5. EXAM: ABDOMEN - 1 VIEW COMPARISON:  CT Abdomen and Pelvis 01/05/2016. PET-CT FINDINGS: Portable AP supine view at 0920 hours. Percutaneous left lower abdominal 2 corresponding to jejunostomy. Superimposed gas-filled mid abdominal small bowel loops, mildly dilated. Large bowel remarkable for abundant retained stool but does not appear significantly dilated. Stable visualized osseous structures. IMPRESSION: 1. Left abdominal jejunostomy tube with superimposed gas distended mid abdominal small bowel. This might be ileus rather than obstruction. 2. Abundant retained stool throughout the large bowel but no definite large bowel dilatation. Electronically Signed   By: Odessa Fleming M.D.   On: 07/05/2023 12:45   DG Chest Port 1 View Result Date: 07/05/2023 CLINICAL DATA:  63 year old female with shortness of breath and abdominal distension. History of breast cancer. EXAM: PORTABLE CHEST 1 VIEW COMPARISON:  Oral chest 07/22/2023 and earlier. FINDINGS: Portable AP semi upright view at 1114 hours. Left chest subclavian Port-A-Cath remains in place. Trace left apical pneumothorax no longer visible. Improved lung volumes. Normal cardiac size and mediastinal contours. Visualized tracheal air column is within normal limits. Allowing for portable technique the lungs are clear. But underlying emphysema demonstrated by CT in February. Stable visualized osseous structures. Abdominal radiographs reported separately. IMPRESSION: Emphysema. Recent trace left pneumothorax no longer visible. No acute cardiopulmonary abnormality. Electronically Signed   By: Odessa Fleming M.D.   On: 07/07/2023 12:39   DG Chest Port 1 View Result Date: 07/22/2023 CLINICAL DATA:  Pneumothorax. EXAM: PORTABLE CHEST 1 VIEW COMPARISON:  July 21, 2023. FINDINGS: The heart size and mediastinal contours are within normal limits. Left subclavian  Port-A-Cath is unchanged. Stable minimal left apical pneumothorax. Right lung is clear. The visualized skeletal structures are unremarkable. IMPRESSION: Stable minimal left apical pneumothorax. Electronically Signed   By: Lupita Raider M.D.   On: 07/22/2023 10:41   DG Chest Port 1 View Result Date: 07/21/2023 CLINICAL DATA:  Pneumothorax. EXAM: PORTABLE CHEST 1 VIEW COMPARISON:  Chest x-ray from same day. FINDINGS: Trace left apical pneumothorax, stable to slightly smaller compared to prior. Otherwise unchanged exam. IMPRESSION: 1. Trace left apical pneumothorax, stable to slightly smaller compared to prior. Electronically Signed   By: Obie Dredge M.D.   On: 07/21/2023 19:46   DG Chest Port 1 View Addendum Date: 07/21/2023 ADDENDUM REPORT: 07/21/2023 19:16 ADDENDUM: Critical Value/emergent results were called by telephone at the time of interpretation on 07/21/2023 at 7:15 pm to provider Larae Grooms, who verbally acknowledged these results. Electronically Signed   By: Narda Rutherford M.D.   On: 07/21/2023 19:16   Result Date: 07/21/2023 CLINICAL DATA:  Post Port-A-Cath placement. EXAM: PORTABLE CHEST 1 VIEW COMPARISON:  Radiograph in CT 06/15/2023 FINDINGS: New left subclavian chest port tip overlies the lower SVC. There is a small left apical pneumothorax, visualized between the posterior left second and third rib. Stable heart size and mediastinal contours. Emphysema with interstitial coarsening. No significant pleural effusion. IMPRESSION: 1. New left subclavian chest port with tip overlying the lower SVC. 2. Small left apical pneumothorax. 3. Emphysema. Attempt is being made to contact the referring clinician at this time. Report completed prior to communication documentation to expedite results. Electronically Signed: By: Narda Rutherford M.D. On: 07/21/2023 18:54   DG C-Arm 1-60 Min-No Report Result Date: 07/21/2023 Fluoroscopy was utilized by the requesting physician.  No radiographic  interpretation.  NM PET Image Initial (PI) Skull Base To Thigh Result Date: 07/09/2023 CLINICAL DATA:  Initial treatment strategy for gastroesophageal junction carcinoma. EXAM: NUCLEAR MEDICINE PET SKULL BASE TO THIGH TECHNIQUE: 6.14 mCi F-18 FDG was injected intravenously. Full-ring PET imaging was performed from the skull base to thigh after the radiotracer. CT data was obtained and used for attenuation correction and anatomic localization. Fasting blood glucose: 100 mg/dl COMPARISON:  None Available. FINDINGS: NECK: No hypermetabolic lymph nodes in the neck. Incidental CT findings: None. CHEST: No hypermetabolic mediastinal or hilar nodes. No suspicious pulmonary nodules on the CT scan. Incidental CT findings: None. ABDOMEN/PELVIS: There is intense metabolic activity through the GE junction extending to the gastric cardia with SUV max equal 13.3 (image 86). retention of food stuff within the esophagus superior to the GE junction consistent with mechanical obstruction. There is a small hypermetabolic lymph node in the porta hepatis with SUV max equal 4.0 on image 94. Small 5 mm short axis lymph node on CT portion exam appears to correspond to the activity. Adjacent small node in the gastrohepatic ligament node on image 92 with SUV max equal 3.6. No focal activity within the liver parenchyma. Metastatic lymph node in the periaortic retroperitoneum LEFT of the aorta at the level of the duodenum measuring 8 mm on image 106 with SUV max equal 4.0. Incidental CT findings: Moderate volume stool in the transverse colon rectosigmoid colon normal. SKELETON: No focal hypermetabolic activity to suggest skeletal metastasis. Incidental CT findings: None. IMPRESSION: 1. Intense metabolic activity at the GE junction consistent primary esophageal carcinoma. 2. Retention of food stuff within the esophagus superior to the GE junction consistent with mechanical obstruction. 3. Small hypermetabolic lymph nodes in the porta  hepatis, gastrohepatic ligament, and periaortic retroperitoneum consistent with regional nodal metastasis. 4. No evidence of distant metastatic disease. Electronically Signed   By: Genevive Bi M.D.   On: 07/09/2023 13:14    Microbiology No results found for this or any previous visit (from the past 240 hours).  Lab Basic Metabolic Panel: Recent Labs  Lab 07/20/23 1920 07/21/23 0549 07/22/23 0515 07/23/23 0451 07/24/2023 1139  NA 136 137 133* 132* 134*  K 3.3* 3.8 3.7 3.3* 4.8  CL 99 102 97* 94* 88*  CO2 22 24 24 28  19*  GLUCOSE 104* 92 132* 158* 421*  BUN 8 6* 6* 9 42*  CREATININE 0.67 0.57 0.53 0.49 2.20*  CALCIUM 8.9 8.3* 8.4* 8.0* 10.3  MG  --   --  1.6* 1.7 3.6*  PHOS  --   --  3.5 3.1 10.1*   Liver Function Tests: Recent Labs  Lab 07/20/23 1920 07/22/23 0515 07/23/23 0451 07/07/2023 1139  AST 15 13* 13* 81*  ALT 14 13 12  95*  ALKPHOS 68 61 64 102  BILITOT 0.8 0.7 0.5 1.2  PROT 6.4* 5.7* 6.3* 7.3  ALBUMIN 3.5 3.0* 3.3* 3.7   No results for input(s): "LIPASE", "AMYLASE" in the last 168 hours. No results for input(s): "AMMONIA" in the last 168 hours. CBC: Recent Labs  Lab 07/20/23 1920 07/21/23 0549 07/22/23 0515 07/23/23 0451 07/18/2023 1139  WBC 7.7 6.5 8.9 9.8 21.9*  NEUTROABS 6.4  --   --   --  18.5*  HGB 9.3* 8.7* 9.0* 9.7* 11.7*  HCT 29.1* 27.9* 28.2* 29.8* 39.8  MCV 88.7 88.6 88.1 86.9 95.9  PLT 321 281 346 441* 739*   Cardiac Enzymes: No results for input(s): "CKTOTAL", "CKMB", "CKMBINDEX", "TROPONINI" in the last 168 hours. Sepsis Labs:  Recent Labs  Lab 07/21/23 0549 07/22/23 0515 07/23/23 0451 07/18/2023 1139 07/18/2023 1141  WBC 6.5 8.9 9.8 21.9*  --   LATICACIDVEN  --   --   --   --  >9.0*    Procedures/Operations  Port-A-Cath insertion, robotic assisted laparoscopic jejunostomy tube placement on 07/21/2023   Franky Macho 07/29/2023, 12:51 PM

## 2023-08-03 NOTE — Progress Notes (Signed)
 [facility]  Very faint pulses and thready. Had to resume CPR. Dr. Lovell Sheehan went to speak with family and if patient is not maintaining her pulse the family is wanting to stop the code blue/ respiratory arrest that led to cardiac arrest.   WBC 21  Lactic 9  Doris Greenhouse, Doris Bender [facility] [address] [place]is B[place], [place] [address] [phone] (office)

## 2023-08-03 NOTE — Progress Notes (Addendum)
 [facility]  Patient had rapid response with HR 130s and BP 100s/60s. She was being in and outed per the RN on 300.  Complaining of back pain. Had KUB this AM with stool burden that was large. Has been spitting up with her GE junction cancer.  Stat CBC, CMP Mg and Phos ordered Repeat KUB and CXR ordered to ensure no changes and no signs of aspiration PNA.  Discussed with Dr. Sherryll Burger and Dr. Lovell Sheehan (he is in the OR).   Patient says she got the suppository but no BM.   UA sent for the urine sampled on the I/O. May need disimpaction.   KUB and CXR repeat without obvious aspiration, stool burden and dilated small intestine noted.   She also feels like she is going numb on the right arm but she is moving the arm and can feel touch.   Lab cannot get stick for blood draw. Will have to access port now.    Algis Greenhouse, MD [facility] [address] [place], [place] [address] [phone] (office)

## 2023-08-03 NOTE — Progress Notes (Signed)
 Rapid response called due to significant abdominal distention with pain with radiation to low back along with worsening tachypnea and drop in blood pressure.  Patient appears short of breath and quite fatigued and may require BiPAP.  Manual blood pressure checked at bedside 100/60.  Abdomen is quite tense and this was examined by attending Dr. Lovell Sheehan this a.m.  KUB demonstrating significant amounts of stool and enema had been ordered for this.  Plan is to check EKG along with labs and transfer to stepdown for now due to some worsening respiratory distress that may require BiPAP use and closer blood pressure monitoring.  Anticipate that enema should help resolve abdominal symptoms.  Case discussed with primary attending Dr. Lovell Sheehan.  Total critical care time: 30 minutes.

## 2023-08-03 NOTE — Progress Notes (Signed)
 Patient arrived to ICU 04 post RR. Initially alert and able to answer questions appropriately. VSS, but unable to obtain pulse oximetry. Fingers cyanotic sec to Raynauds, bil legs mottled, abdomen distended. Patient began to vomit after left chest port was accessed and became unresponsive with dilated pupils. Not responding to sternal rub. HR dropped from ST to 40s. ACLS initiated.   1141: 1mg  Atropine 1142:Chest compressions 1143:1mg  Epi, PEA 1146: Thready carotid, Intubated with #7 ETT 1150: CBG 153 1154: BP 33/19 (21)  1155: Atropine 1mg  1157: Levophed started via port at 20 mcg 1158: 1mg  Epi, Levo to 1201: 1mg  Epi 1204: Levo at 1206: BP 41/15 (24)   Efforts terminated by Dr Lovell Sheehan, MD present at start of event. TOD 1236.

## 2023-08-03 NOTE — Progress Notes (Signed)
 [facility]  Patient proceeded to drop HR again. Atropine given but did not change. Levophed added for BP. Had to do another CPR and epi.  She got two more rounds of epinephrine and CPR and had ROSC with a faint carotid pulse.  Dr. Lovell Sheehan has spoken with the family.   Doris Greenhouse, MD [facility] [address] [place], [place] [address] [phone] (office)

## 2023-08-03 NOTE — Progress Notes (Signed)
 Patient was complaining of abdominal pain. I & O cath performed with the direction of the primary nurse. of amber urine obtained. Sample reserved. After repositioning patient, she started complaining of more pain in her back and stomach. Ms. Malachowski then complained of her hands "going numb". Cap refill greater than 3 sec. Hands ashen. V/S obtained pulse 102, rr 40, bp 88/66. Charge nurse informed and Rapid Response called. -Armanda Magic, RN, MSN, [employer] Instructor.

## 2023-08-03 NOTE — Plan of Care (Signed)

## 2023-08-03 NOTE — Progress Notes (Signed)
 5 Days Post-Op  Subjective: Patient feels bloated and trying to have a bowel movement.  She states she had a bowel movement 2 days ago.  She is passing flatus.  Objective: Vital signs in last 24 hours: Temp:  [97.7 F (36.5 C)-98.4 F (36.9 C)] 98.1 F (36.7 C) (03/24 0507) Pulse Rate:  [94-113] 109 (03/24 0507) Resp:  [17-18] 18 (03/24 0507) BP: (91-111)/(64-73) 91/68 (03/24 0507) SpO2:  [95 %-100 %] 95 % (03/23 1936) Weight:  [63.1 kg] 63.1 kg (03/24 0507) Last BM Date : 07/23/23  Intake/Output from previous day: 03/23 0701 - 03/24 0700 In: 1255.7 [P.O.:600; NG/GT:655.7] Out: -  Intake/Output this shift: No intake/output data recorded.  General appearance: alert, cooperative, and mild distress Resp: clear to auscultation bilaterally Cardio: regular rate and rhythm, S1, S2 normal, no murmur, click, rub or gallop GI: Soft but distended with minimal bowel sounds appreciated.  Not tense.  Lab Results:  No results for input(s): "WBC", "HGB", "HCT", "PLT" in the last 72 hours. BMET No results for input(s): "NA", "K", "CL", "CO2", "GLUCOSE", "BUN", "CREATININE", "CALCIUM" in the last 72 hours. PT/INR No results for input(s): "LABPROT", "INR" in the last 72 hours.  Studies/Results: No results found.  Anti-infectives: Anti-infectives (From admission, onward)    Start     Dose/Rate Route Frequency Ordered Stop   07/21/23 0900  ceFAZolin (ANCEF) IVPB 2g/100 mL premix        2 g 200 mL/hr over 30 Minutes Intravenous On call to O.R. 07/21/23 0758 07/21/23 1218   07/21/23 0800  ceFAZolin (ANCEF) 2-4 GM/100ML-% IVPB       Note to Pharmacy: Trenton Gammon S: cabinet override      07/21/23 0800 07/21/23 1159       Assessment/Plan: s/p Procedure(s): INSERTION, JEJUNOSTOMY TUBE, ROBOT-ASSISTED INSERTION, TUNNELED CENTRAL VENOUS DEVICE, WITH PORT Impression: Patient with abdominal fullness most likely secondary to bowel dysmotility, postoperative ileus. Plan: Will give milk  of molasses enema this morning.  Continue supportive care.  LOS: 6 days    Franky Macho 07/10/2023

## 2023-08-03 NOTE — TOC Progression Note (Signed)
 Transition of Care [facility]) - Progression Note    Patient Details  Name: Doris Bender MRN: [id number] Date of Birth: 09-12-1960  [facility] CM/SW Contact  Leitha Bleak, RN Phone Number: 07/27/2023, 10:19 AM  Clinical Narrative:   [employer] has her order ready to ship. They need a discharge date. MD updated. TOC following.    Expected Discharge Plan: Home/Self Care Barriers to Discharge: Continued Medical Work up  Expected Discharge Plan and Services In-house Referral: Clinical Social Work Discharge Planning Services: CM Consult   Living arrangements for the past 2 months: Single Family Home                   Social Determinants of Health (SDOH) Interventions SDOH Screenings   Food Insecurity: No Food Insecurity (07/21/2023)  Housing: Low Risk  (07/21/2023)  Recent Concern: Housing - High Risk (06/16/2023)  Transportation Needs: No Transportation Needs (07/21/2023)  Utilities: Not At Risk (07/21/2023)  Physical Activity: Insufficiently Active (04/12/2023)   [facility] from [facility]  Social Connections: Moderately Isolated (07/21/2023)  Stress: No Stress Concern Present (11/26/2020)   Received [facility]  Tobacco Use: Medium Risk (07/21/2023)  Health Literacy: Low Risk  (11/26/2020)   [facility] from [facility]    Readmission Risk Interventions    07/21/2023   11:09 AM  Readmission Risk Prevention Plan  Transportation Screening Complete  HRI or Home Care Consult Complete  Social Work Consult for Recovery Care Planning/Counseling Complete  Palliative Care Screening Not Applicable  Medication Review Oceanographer) Complete

## 2023-08-03 DEATH — deceased

## 2023-08-04 ENCOUNTER — Inpatient Hospital Stay

## 2023-08-04 ENCOUNTER — Ambulatory Visit

## 2023-08-05 ENCOUNTER — Ambulatory Visit: Payer: Commercial Managed Care - PPO | Admitting: Physician Assistant
